# Patient Record
Sex: Male | Born: 1945 | Race: White | Hispanic: No | Marital: Married | Smoking: Former smoker
Health system: Southern US, Community
[De-identification: ages and names within clinical notes are randomized; demographics above are authoritative.]

## PROBLEM LIST (undated history)

## (undated) DIAGNOSIS — H269 Unspecified cataract: Secondary | ICD-10-CM

## (undated) DIAGNOSIS — N400 Enlarged prostate without lower urinary tract symptoms: Secondary | ICD-10-CM

## (undated) DIAGNOSIS — C801 Malignant (primary) neoplasm, unspecified: Secondary | ICD-10-CM

## (undated) DIAGNOSIS — I712 Thoracic aortic aneurysm, without rupture, unspecified: Secondary | ICD-10-CM

## (undated) DIAGNOSIS — R7611 Nonspecific reaction to tuberculin skin test without active tuberculosis: Secondary | ICD-10-CM

## (undated) DIAGNOSIS — L570 Actinic keratosis: Secondary | ICD-10-CM

## (undated) DIAGNOSIS — E039 Hypothyroidism, unspecified: Secondary | ICD-10-CM

## (undated) DIAGNOSIS — M1990 Unspecified osteoarthritis, unspecified site: Secondary | ICD-10-CM

## (undated) DIAGNOSIS — Z85850 Personal history of malignant neoplasm of thyroid: Secondary | ICD-10-CM

## (undated) DIAGNOSIS — E785 Hyperlipidemia, unspecified: Secondary | ICD-10-CM

## (undated) DIAGNOSIS — I10 Essential (primary) hypertension: Secondary | ICD-10-CM

## (undated) DIAGNOSIS — Z923 Personal history of irradiation: Secondary | ICD-10-CM

## (undated) HISTORY — DX: Nonspecific reaction to tuberculin skin test without active tuberculosis: R76.11

## (undated) HISTORY — DX: Malignant (primary) neoplasm, unspecified: C80.1

## (undated) HISTORY — DX: Benign prostatic hyperplasia without lower urinary tract symptoms: N40.0

## (undated) HISTORY — DX: Unspecified osteoarthritis, unspecified site: M19.90

## (undated) HISTORY — DX: Personal history of malignant neoplasm of thyroid: Z85.850

## (undated) HISTORY — PX: COLONOSCOPY: SHX174

## (undated) HISTORY — PX: EYE SURGERY: SHX253

## (undated) HISTORY — DX: Hypothyroidism, unspecified: E03.9

## (undated) HISTORY — DX: Hyperlipidemia, unspecified: E78.5

## (undated) HISTORY — PX: HAND SURGERY: SHX662

## (undated) HISTORY — DX: Actinic keratosis: L57.0

---

## 1988-11-17 DIAGNOSIS — Z923 Personal history of irradiation: Secondary | ICD-10-CM

## 1988-11-17 DIAGNOSIS — Z85850 Personal history of malignant neoplasm of thyroid: Secondary | ICD-10-CM

## 1988-11-17 HISTORY — PX: OTHER SURGICAL HISTORY: SHX169

## 1988-11-17 HISTORY — DX: Personal history of malignant neoplasm of thyroid: Z85.850

## 1988-11-17 HISTORY — DX: Personal history of irradiation: Z92.3

## 1988-11-17 HISTORY — PX: TOTAL THYROIDECTOMY: SHX2547

## 1999-02-05 ENCOUNTER — Ambulatory Visit (HOSPITAL_COMMUNITY): Admission: RE | Admit: 1999-02-05 | Discharge: 1999-02-05 | Payer: Self-pay | Admitting: Family Medicine

## 1999-02-05 ENCOUNTER — Encounter: Payer: Self-pay | Admitting: Family Medicine

## 2003-06-26 ENCOUNTER — Encounter: Payer: Self-pay | Admitting: Family Medicine

## 2003-06-26 ENCOUNTER — Ambulatory Visit (HOSPITAL_COMMUNITY): Admission: RE | Admit: 2003-06-26 | Discharge: 2003-06-26 | Payer: Self-pay | Admitting: Family Medicine

## 2004-05-03 ENCOUNTER — Ambulatory Visit (HOSPITAL_COMMUNITY): Admission: RE | Admit: 2004-05-03 | Discharge: 2004-05-03 | Payer: Self-pay | Admitting: Family Medicine

## 2005-02-24 ENCOUNTER — Ambulatory Visit (HOSPITAL_COMMUNITY): Admission: RE | Admit: 2005-02-24 | Discharge: 2005-02-24 | Payer: Self-pay | Admitting: Gastroenterology

## 2008-12-26 ENCOUNTER — Ambulatory Visit: Payer: Self-pay | Admitting: Internal Medicine

## 2008-12-26 DIAGNOSIS — E039 Hypothyroidism, unspecified: Secondary | ICD-10-CM

## 2008-12-26 DIAGNOSIS — Z85850 Personal history of malignant neoplasm of thyroid: Secondary | ICD-10-CM

## 2008-12-26 DIAGNOSIS — E785 Hyperlipidemia, unspecified: Secondary | ICD-10-CM

## 2008-12-26 HISTORY — DX: Hyperlipidemia, unspecified: E78.5

## 2008-12-26 HISTORY — DX: Hypothyroidism, unspecified: E03.9

## 2008-12-27 ENCOUNTER — Telehealth: Payer: Self-pay | Admitting: Internal Medicine

## 2008-12-27 LAB — CONVERTED CEMR LAB
ALT: 130 units/L — ABNORMAL HIGH (ref 0–53)
AST: 90 units/L — ABNORMAL HIGH (ref 0–37)
Albumin: 4.1 g/dL (ref 3.5–5.2)
Alkaline Phosphatase: 67 units/L (ref 39–117)
BUN: 12 mg/dL (ref 6–23)
Basophils Absolute: 0 10*3/uL (ref 0.0–0.1)
Basophils Relative: 0.3 % (ref 0.0–3.0)
Bilirubin, Direct: 0.1 mg/dL (ref 0.0–0.3)
CO2: 29 meq/L (ref 19–32)
Calcium: 9.2 mg/dL (ref 8.4–10.5)
Chloride: 105 meq/L (ref 96–112)
Cholesterol: 152 mg/dL (ref 0–200)
Creatinine, Ser: 1 mg/dL (ref 0.4–1.5)
Eosinophils Absolute: 0.1 10*3/uL (ref 0.0–0.7)
Eosinophils Relative: 1.8 % (ref 0.0–5.0)
GFR calc Af Amer: 97 mL/min
GFR calc non Af Amer: 80 mL/min
Glucose, Bld: 98 mg/dL (ref 70–99)
HCT: 43.7 % (ref 39.0–52.0)
Hemoglobin: 14.9 g/dL (ref 13.0–17.0)
Lymphocytes Relative: 29.9 % (ref 12.0–46.0)
MCHC: 34.1 g/dL (ref 30.0–36.0)
MCV: 88.3 fL (ref 78.0–100.0)
Monocytes Absolute: 0.5 10*3/uL (ref 0.1–1.0)
Monocytes Relative: 8.3 % (ref 3.0–12.0)
Neutro Abs: 3.5 10*3/uL (ref 1.4–7.7)
Neutrophils Relative %: 59.7 % (ref 43.0–77.0)
PSA: 0.84 ng/mL (ref 0.10–4.00)
Platelets: 178 10*3/uL (ref 150–400)
Potassium: 4.2 meq/L (ref 3.5–5.1)
RBC: 4.95 M/uL (ref 4.22–5.81)
RDW: 12.5 % (ref 11.5–14.6)
Sodium: 141 meq/L (ref 135–145)
TSH: 3.83 microintl units/mL (ref 0.35–5.50)
Total Bilirubin: 0.9 mg/dL (ref 0.3–1.2)
Total Protein: 6.9 g/dL (ref 6.0–8.3)
WBC: 5.9 10*3/uL (ref 4.5–10.5)

## 2009-01-01 ENCOUNTER — Telehealth: Payer: Self-pay | Admitting: Internal Medicine

## 2009-01-26 ENCOUNTER — Ambulatory Visit: Payer: Self-pay | Admitting: Internal Medicine

## 2009-05-03 ENCOUNTER — Ambulatory Visit: Payer: Self-pay | Admitting: Internal Medicine

## 2009-05-03 DIAGNOSIS — J069 Acute upper respiratory infection, unspecified: Secondary | ICD-10-CM | POA: Insufficient documentation

## 2009-10-05 ENCOUNTER — Telehealth: Payer: Self-pay | Admitting: Internal Medicine

## 2010-01-28 ENCOUNTER — Telehealth: Payer: Self-pay | Admitting: Internal Medicine

## 2010-10-04 ENCOUNTER — Ambulatory Visit: Payer: Self-pay | Admitting: Internal Medicine

## 2010-10-04 ENCOUNTER — Encounter: Payer: Self-pay | Admitting: Internal Medicine

## 2010-10-04 LAB — CONVERTED CEMR LAB
ALT: 41 units/L (ref 0–53)
AST: 33 units/L (ref 0–37)
Albumin: 4.4 g/dL (ref 3.5–5.2)
Alkaline Phosphatase: 64 units/L (ref 39–117)
BUN: 19 mg/dL (ref 6–23)
Basophils Absolute: 0 10*3/uL (ref 0.0–0.1)
Basophils Relative: 0.7 % (ref 0.0–3.0)
Bilirubin, Direct: 0.1 mg/dL (ref 0.0–0.3)
CO2: 31 meq/L (ref 19–32)
Calcium: 9.4 mg/dL (ref 8.4–10.5)
Chloride: 102 meq/L (ref 96–112)
Cholesterol: 183 mg/dL (ref 0–200)
Creatinine, Ser: 1.1 mg/dL (ref 0.4–1.5)
Eosinophils Absolute: 0.1 10*3/uL (ref 0.0–0.7)
Eosinophils Relative: 2.6 % (ref 0.0–5.0)
GFR calc non Af Amer: 71.26 mL/min (ref >60)
Glucose, Bld: 93 mg/dL (ref 70–99)
HCT: 45.2 % (ref 39.0–52.0)
HDL: 61.1 mg/dL (ref >39.00)
Hemoglobin: 15.4 g/dL (ref 13.0–17.0)
LDL Cholesterol: 113 mg/dL — ABNORMAL HIGH (ref 0–99)
Lymphocytes Relative: 32.3 % (ref 12.0–46.0)
Lymphs Abs: 1.7 10*3/uL (ref 0.7–4.0)
MCHC: 33.9 g/dL (ref 30.0–36.0)
MCV: 90.1 fL (ref 78.0–100.0)
Monocytes Absolute: 0.5 10*3/uL (ref 0.1–1.0)
Monocytes Relative: 9 % (ref 3.0–12.0)
Neutro Abs: 2.8 10*3/uL (ref 1.4–7.7)
Neutrophils Relative %: 55.4 % (ref 43.0–77.0)
PSA: 0.92 ng/mL (ref 0.10–4.00)
Platelets: 136 10*3/uL — ABNORMAL LOW (ref 150.0–400.0)
Potassium: 4.8 meq/L (ref 3.5–5.1)
RBC: 5.02 M/uL (ref 4.22–5.81)
RDW: 13.7 % (ref 11.5–14.6)
Sodium: 142 meq/L (ref 135–145)
TSH: 2.01 microintl units/mL (ref 0.35–5.50)
Total Bilirubin: 0.8 mg/dL (ref 0.3–1.2)
Total CHOL/HDL Ratio: 3
Total Protein: 6.9 g/dL (ref 6.0–8.3)
Triglycerides: 46 mg/dL (ref 0.0–149.0)
VLDL: 9.2 mg/dL (ref 0.0–40.0)
WBC: 5.1 10*3/uL (ref 4.5–10.5)

## 2010-10-18 ENCOUNTER — Telehealth: Payer: Self-pay

## 2010-12-16 ENCOUNTER — Ambulatory Visit
Admission: RE | Admit: 2010-12-16 | Discharge: 2010-12-16 | Payer: Self-pay | Source: Home / Self Care | Attending: Internal Medicine | Admitting: Internal Medicine

## 2010-12-16 DIAGNOSIS — L570 Actinic keratosis: Secondary | ICD-10-CM | POA: Insufficient documentation

## 2010-12-16 DIAGNOSIS — R300 Dysuria: Secondary | ICD-10-CM | POA: Insufficient documentation

## 2010-12-16 DIAGNOSIS — N410 Acute prostatitis: Secondary | ICD-10-CM | POA: Insufficient documentation

## 2010-12-16 HISTORY — DX: Actinic keratosis: L57.0

## 2010-12-16 LAB — CONVERTED CEMR LAB
Bilirubin Urine: NEGATIVE
Blood in Urine, dipstick: NEGATIVE
Glucose, Urine, Semiquant: NEGATIVE
Ketones, urine, test strip: NEGATIVE
Nitrite: NEGATIVE
Protein, U semiquant: NEGATIVE
Specific Gravity, Urine: 1.015
Urobilinogen, UA: 0.2
WBC Urine, dipstick: NEGATIVE
pH: 6.5

## 2010-12-17 NOTE — Progress Notes (Signed)
Summary: REQUEST FOR LAB RESULTS  Phone Note Call from Patient   Caller: Patient Summary of Call: Pt called to obtain results of recent labwork...Marland KitchenMarland Kitchen Pt can be reached at [phone].  Initial call taken by: Debbra Riding,  October 18, 2010 9:28 AM  Follow-up for Phone Call        attempt to call - no ans no mach . Will try later. KIK Follow-up by: Duard Brady LPN,  October 18, 2010 10:59 AM  Additional Follow-up for Phone Call Additional follow up Details #1::        Pt called back to adv to ck on status of previous phone call.... advised he can be reached at [phone].  Additional Follow-up by: Debbra Riding,  October 18, 2010 3:22 PM    Additional Follow-up for Phone Call Additional follow up Details #2::    attempt to call - ans mach - LMTCB if questions - all labs WNL     KIK Follow-up by: Duard Brady LPN,  October 22, 2010 9:07 AM

## 2010-12-17 NOTE — Progress Notes (Signed)
Summary: REFILLS  Phone Note Refill Request Call back at Home Phone [phone] Message from:  Patient---LIVE CALL  Refills Requested: Medication #1:  LIPITOR 20 MG TABS 1 every other day  Medication #2:  LEVOXYL 137 MCG TABS 1 once daily [facility] FAX---[phone]  Initial call taken by: Warnell Forester,  January 28, 2010 2:03 PM    Prescriptions: LEVOXYL 137 MCG TABS (LEVOTHYROXINE SODIUM) 1 once daily  #90 x 2   Entered by:   Duard Brady LPN   Authorized by:   Gordy Savers  MD   Signed by:   Duard Brady LPN on 95/62/1308   Method used:   Electronically to        [facility]* (mail-order)       [address]       [place], [place]  [address]       Ph: [phone]       Fax: [phone]   RxID:   [id number] LIPITOR 20 MG TABS (ATORVASTATIN CALCIUM) 1 every other day  #90 x 2   Entered by:   Duard Brady LPN   Authorized by:   Gordy Savers  MD   Signed by:   Duard Brady LPN on 38/75/6433   Method used:   Electronically to        [facility]* (mail-order)       [address]       [place], [place]  [address]       Ph: [phone]       Fax: [phone]   RxID:   [id number]

## 2010-12-17 NOTE — Assessment & Plan Note (Signed)
Summary: CPX (PT WILL COME IN FASTING) - MEDICARE/EMP // RS   Vital Signs:  Patient profile:   65 year old male Height:      70 inches Weight:      206 pounds BMI:     29.66 Temp:     98.1 degrees F oral BP sitting:   128 / 76  (right arm) Cuff size:   regular  Vitals Entered By: Duard Brady LPN (October 04, 2010 8:36 AM) CC: cpx - doing well    **declines flu vaccine Is Patient Diabetic? No   CC:  cpx - doing well    **declines flu vaccine.  History of Present Illness: 65 year old patient who is seen today for a wellness exam.  He enjoys excellent health visit history of hypothyroidism and mild dyslipidemia.  No concerns or complaints.  Today  Here for Medicare AWV:  1.   Risk factors based on Past M, S, F history:  cardiovascular risk factors include a history of dyslipidemia; mother had an MI at age 66 2.   Physical Activities:  goes to his health club at least 3 times per week 3.   Depression/mood: no history of depression or mood disorder 4.   Hearing: no hearing deficits 5.   ADL's: independent in all aspects of daily living 6.   Fall Risk: low 7.   Home Safety: no problems  identified 8.   Height, weight, &visual acuity:  height and weight stable.  No difficulty with visual acuity 9.   Counseling: heart healthy diet regular exercise.  All encouraged 10.   Labs ordered based on risk factors: laboratory profile including lipid panel will be reviewed.  A TSH will be reviewed 11.           Referral Coordination- not appropriate at this time 12.           Care Plan- continue heart healthy diet and regular.  Exercise regimen 13.            Cognitive Assessment- alert and oriented, with normal affect.  No cognitive dysfunction ; handles  all executive functions without difficulty   Allergies: 1)  ! 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 Father died age  mid 81s- MVA, 68, TB,   Mother- MI age 74, tobacco  1 brother-obesity  Social History: Reviewed history from 12/26/2008 and no changes required. 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    Physical Exam  General:  Well-developed,well-nourished,in no acute distress; alert,appropriate and cooperative throughout examination Head:  Normocephalic and atraumatic without obvious abnormalities. No apparent alopecia or balding. Eyes:  No corneal or conjunctival inflammation noted. EOMI. Perrla. Funduscopic exam benign, without hemorrhages, exudates or papilledema. Vision grossly normal. Ears:  External ear exam shows no significant lesions or deformities.  Otoscopic examination reveals clear canals, tympanic membranes are intact bilaterally without bulging,  retraction, inflammation or discharge. Hearing is grossly normal bilaterally. Nose:  External nasal examination shows no deformity or inflammation. Nasal mucosa are pink and moist without lesions or exudates. Mouth:  Oral mucosa and oropharynx without lesions or exudates.  Teeth in good repair. Neck:  No deformities, masses, or tenderness noted. Chest Wall:  No deformities,  masses, tenderness or gynecomastia noted. Breasts:  No masses or gynecomastia noted Lungs:  Normal respiratory effort, chest expands symmetrically. Lungs are clear to auscultation, no crackles or wheezes. Heart:  Normal rate and regular rhythm. S1 and S2 normal without gallop, murmur, click, rub or other extra sounds. Abdomen:  Bowel sounds positive,abdomen soft and non-tender without masses, organomegaly or hernias noted. Rectal:  No external abnormalities noted. Normal sphincter tone. No rectal masses or tenderness. Genitalia:  Testes bilaterally descended without nodularity, tenderness or masses. No scrotal masses or lesions. No penis lesions or urethral discharge. Prostate:  Prostate gland firm and smooth, no enlargement, nodularity, tenderness, mass, asymmetry or induration. Msk:  No deformity or scoliosis noted of thoracic or lumbar spine.   Pulses:  R and L carotid,radial,femoral,dorsalis pedis and posterior tibial pulses are full and equal bilaterally Extremities:  No clubbing, cyanosis, edema, or deformity noted with normal full range of motion of all joints.   Neurologic:  No cranial nerve deficits noted. Station and gait are normal. Plantar reflexes are down-going bilaterally. DTRs are symmetrical throughout. Sensory, motor and coordinative functions appear intact. Skin:  Intact without suspicious lesions or rashes Cervical Nodes:  No lymphadenopathy noted Axillary Nodes:  No palpable lymphadenopathy Inguinal Nodes:  No significant adenopathy Psych:  Cognition and judgment appear intact. Alert and cooperative with normal attention span and concentration. No apparent delusions, illusions, hallucinations   Impression & Recommendations:  Problem # 1:  PREVENTIVE HEALTH CARE (ICD-V70.0)  Orders: EKG w/ Interpretation (93000) Medicare -1st Annual Wellness Visit [phone]) TLB-BMP (Basic Metabolic Panel-BMET) (80048-METABOL) TLB-CBC Platelet - w/Differential  (85025-CBCD) TLB-Hepatic/Liver Function Pnl (80076-HEPATIC) TLB-PSA (Prostate Specific Antigen) (84153-PSA) Specimen Handling (96295)  Complete Medication List: 1)  Lipitor 20 Mg Tabs (Atorvastatin calcium) .Marland Kitchen.. 1 every  day 2)  Levoxyl 137 Mcg Tabs (Levothyroxine sodium) .Marland Kitchen.. 1 once daily 3)  Adult Aspirin Ec Low Strength 81 Mg Tbec (Aspirin) .Marland Kitchen.. 1 once daily 4)  Cialis 20 Mg Tabs (Tadalafil) .... Once daily as directed 5)  Glucosamine 500 Mg Caps (Glucosamine sulfate) .... 3 qd  Other Orders: Venipuncture (28413) TLB-Lipid Panel (80061-LIPID) TLB-TSH (Thyroid Stimulating Hormone) (24401-UUV)  Patient Instructions: 1)  Please schedule a follow-up appointment in 1 year. 2)  Limit your Sodium (Salt) to less than 2 grams a day(slightly less than 1/2 a teaspoon) to prevent fluid retention, swelling, or worsening of symptoms. 3)  It is important that you exercise regularly at least 20 minutes 5 times a week. If you develop chest pain, have severe difficulty breathing, or feel very tired , stop exercising immediately and seek medical attention. Prescriptions: LIPITOR 20 MG TABS (ATORVASTATIN CALCIUM) 1 every  day  #90 x 6   Entered and Authorized by:   Gordy Savers  MD   Signed by:   Gordy Savers  MD on 10/04/2010   Method used:   Electronically to        [facility]* (mail-order)       [address]       [place], [place]  [address]       Ph: [phone]       Fax: [phone]  RxID:   [id number] LEVOXYL 137 MCG TABS (LEVOTHYROXINE SODIUM) 1 once daily  #90 x 6   Entered and Authorized by:   Gordy Savers  MD   Signed by:   Gordy Savers  MD on 10/04/2010   Method used:   Electronically to        [facility]* (mail-order)       [address]       [place], [place]  [address]       Ph: [phone]       Fax: [phone]   RxID:   [id number] LIPITOR 20 MG TABS (ATORVASTATIN CALCIUM) 1 every other day  #90  x 6   Entered and Authorized by:   Gordy Savers  MD   Signed by:   Gordy Savers  MD on 10/04/2010   Method used:   Electronically to        [facility]* (mail-order)       [address]       [place], [place]  [address]       Ph: [phone]       Fax: [phone]   RxID:   [id number]    Orders Added: 1)  EKG w/ Interpretation [93000] 2)  Medicare -1st Annual Wellness Visit [G0438] 3)  Est. Patient Level III [32355] 4)  Venipuncture [36415] 5)  TLB-Lipid Panel [80061-LIPID] 6)  TLB-BMP (Basic Metabolic Panel-BMET) [80048-METABOL] 7)  TLB-CBC Platelet - w/Differential [85025-CBCD] 8)  TLB-Hepatic/Liver Function Pnl [80076-HEPATIC] 9)  TLB-TSH (Thyroid Stimulating Hormone) [84443-TSH] 10)  TLB-PSA (Prostate Specific Antigen) [73220-URK] 11)  Specimen Handling [99000]

## 2010-12-24 ENCOUNTER — Encounter: Payer: Self-pay | Admitting: Internal Medicine

## 2010-12-24 ENCOUNTER — Telehealth: Payer: Self-pay | Admitting: *Deleted

## 2010-12-24 ENCOUNTER — Ambulatory Visit (INDEPENDENT_AMBULATORY_CARE_PROVIDER_SITE_OTHER): Payer: MEDICARE | Admitting: Internal Medicine

## 2010-12-24 VITALS — BP 120/70 | Temp 98.2°F | Ht 70.0 in | Wt 208.0 lb

## 2010-12-24 DIAGNOSIS — R110 Nausea: Secondary | ICD-10-CM

## 2010-12-24 NOTE — Telephone Encounter (Signed)
Pt is complaining of low grade fever, nausea, vomiting and had to stop his antibiotics due GI symptoms before he finished his course.  Advised appt today with Dr. Kirtland Bouchard and was given a time.

## 2010-12-24 NOTE — Progress Notes (Signed)
  Subjective:    Patient ID: Carlos David, male    DOB: 07/15/1946, 65 y.o.   MRN: [id number]  HPI  65 year old patient who was evaluated last week with symptoms of urinary frequency and dysuria.  He was placed on Cipro, which he discontinued 3 days ago due to nausea.  His nausea has persisted but the urinary frequency and dysuria have resolved.  Denies any abdominal pain, vomiting, or change in his bowel habits.  Denies any fever.    Review of Systems  Constitutional: Negative for fever, chills, appetite change and fatigue.  HENT: Negative for hearing loss, ear pain, congestion, sore throat, trouble swallowing, neck stiffness, dental problem, voice change and tinnitus.   Eyes: Negative for pain, discharge and visual disturbance.  Respiratory: Negative for cough, chest tightness, wheezing and stridor.   Cardiovascular: Negative for chest pain, palpitations and leg swelling.  Gastrointestinal: Negative for nausea, vomiting, abdominal pain, diarrhea, constipation, blood in stool and abdominal distention.  Genitourinary: Negative for urgency, hematuria, flank pain, discharge, difficulty urinating and genital sores.  Musculoskeletal: Negative for myalgias, back pain, joint swelling, arthralgias and gait problem.  Skin: Negative for rash.  Neurological: Negative for dizziness, syncope, speech difficulty, weakness, numbness and headaches.  Hematological: Negative for adenopathy. Does not bruise/bleed easily.  Psychiatric/Behavioral: Negative for behavioral problems and dysphoric mood. The patient is not nervous/anxious.        Objective:   Physical Exam  Constitutional: He is oriented to person, place, and time. He appears well-developed.  HENT:  Head: Normocephalic.  Right Ear: External ear normal.  Left Ear: External ear normal.  Mouth/Throat: Oropharynx is clear and moist.  Eyes: Conjunctivae and EOM are normal.  Neck: Normal range of motion.  Cardiovascular: Normal rate and normal  heart sounds.   Pulmonary/Chest: Effort normal and breath sounds normal.  Abdominal: Bowel sounds are normal. He exhibits no distension and no mass. There is no tenderness. There is no rebound and no guarding.  Musculoskeletal: Normal range of motion. He exhibits no edema and no tenderness.  Neurological: He is alert and oriented to person, place, and time.  Psychiatric: He has a normal mood and affect. His behavior is normal.          Assessment & Plan:

## 2010-12-24 NOTE — Patient Instructions (Signed)
Drink clear liquids only for the next 24 hours,  then slowly add other liquids and food as you  tolerate them  Call or return to clinic prn if these symptoms worsen or fail to improve as anticipated.  

## 2010-12-25 NOTE — Assessment & Plan Note (Signed)
Summary: ?uti/lower abdominal pain/painful urination/cjr   Vital Signs:  Patient profile:   65 year old male Weight:      202 pounds Temp:     98.2 degrees F oral BP sitting:   120 / 82  (right arm) Cuff size:   regular  Vitals Entered By: Duard Brady LPN (December 16, 2010 3:42 PM) CC: c/o painful urination Is Patient Diabetic? No   CC:  c/o painful urination.  History of Present Illness: 46 -year-old patient who presents with a 5-day history of burning, dysuria, urgency, and slow urinary stream.  He was seen for a physical two months ago and has a normal prostate examination and a low PSA.  his has some mild constipation issues related to his voiding difficulties.  No nocturia.  Denies any hematuria.  A UA was reviewed today and was normal. he also complained of a chronic lesion involving the dorsum of his right hand     Allergies: 1)  ! Penicillin G Potassium (Penicillin G Potassium)  Past History:  Past Surgical History: Reviewed history from 10/04/2010 and no changes required. status post thyroidectomy in 1990 followed by left neck dissection Tonsillectomy  childhood colonoscopy 2008   Physical Exam  General:  Well-developed,well-nourished,in no acute distress; alert,appropriate and cooperative throughout examination Abdomen:  Bowel sounds positive,abdomen soft and non-tender without masses, organomegaly or hernias noted. 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 1 every  day 2)  Levoxyl 137 Mcg Tabs (Levothyroxine sodium) .Marland Kitchen.. 1 once daily 3)  Adult Aspirin Ec Low Strength 81 Mg Tbec (Aspirin) .Marland Kitchen.. 1 once daily 4)  Cialis 20 Mg Tabs (Tadalafil) .... Once daily as directed 5)  Glucosamine 500 Mg Caps (Glucosamine sulfate) .... 3 qd 6)  Ciprofloxacin Hcl 500 Mg Tabs (Ciprofloxacin hcl) .... One twice daily 7)  Tamsulosin Hcl 0.4 Mg Caps (Tamsulosin hcl) .... One daily  Patient Instructions: 1)  Drink as much fluid as you can tolerate for the next few days. 2)  Take your antibiotic as prescribed until ALL of it is gone, but stop if you develop a rash or swelling and contact our office as soon as possible. Prescriptions: TAMSULOSIN HCL 0.4 MG CAPS (TAMSULOSIN HCL) one daily  #30 x 0   Entered and Authorized by:   Gordy Savers  MD   Signed by:   Gordy Savers  MD on 12/16/2010   Method used:   Print then Give to Patient   RxID:   [id number] CIPROFLOXACIN HCL 500 MG TABS (CIPROFLOXACIN HCL) one twice daily  #20 x 0   Entered and Authorized by:   Gordy Savers  MD   Signed by:   Gordy Savers  MD on 12/16/2010   Method used:   Print then Give to Patient   RxID:   [id number]    Orders Added: 1)  UA Dipstick w/o Micro (manual) [81002] 2)  Est. Patient Level III [29528] 3)  Cryotherapy/Destruction benign or premalignant  lesion (1st lesion)  [17000]    Laboratory Results   Urine Tests  Date/Time Received: December 16, 2010 3:54 PM  Date/Time Reported: December 16, 2010 3:54 PM   Routine Urinalysis   Color: yellow Appearance: Clear Glucose: negative   (Normal Range: Negative) Bilirubin: negative   (Normal Range: Negative) Ketone: negative   (Normal Range: Negative) Spec. Gravity: 1.015   (Normal Range: 1.003-1.035) Blood: negative   (Normal Range: Negative) pH: 6.5   (Normal Range: 5.0-8.0) Protein: negative   (Normal Range: Negative) Urobilinogen: 0.2    (Normal Range: 0-1) Nitrite: negative   (Normal Range: Negative) Leukocyte Esterace: negative   (Normal Range: Negative)

## 2011-02-12 ENCOUNTER — Other Ambulatory Visit: Payer: Self-pay | Admitting: Internal Medicine

## 2011-04-04 NOTE — Op Note (Signed)
Carlos David, CIANCI NO.:  [id number]   MEDICAL RECORD NO.:  [id number]          PATIENT TYPE:  AMB   LOCATION:  ENDO                         FACILITY[facility]  [facility]   PHYSICIAN:  John C. Madilyn Fireman, M.D.    DATE OF BIRTH:  Nov 11, 1946   DATE OF PROCEDURE:  02/24/2005  DATE OF DISCHARGE:                                 OPERATIVE REPORT   PROCEDURE:  Colonoscopy.   INDICATIONS FOR PROCEDURE:  Average risk colon cancer screening.   PROCEDURE:  The patient was placed in the left lateral decubitus position  and placed on the pulse monitor with continuous low-flow oxygen delivered by  nasal cannula.  He was sedated with 75 mcg IV fentanyl and 6 mg IV Versed.  The Olympus video colonoscope was inserted into the rectum and advanced to  cecum, confirmed by transillumination of McBurney's point and visualization  of ileocecal valve and appendiceal orifice.  The prep was excellent.  The  cecum, ascending, transverse, descending, and sigmoid colon all appeared  normal with no masses, polyps, diverticula, or other mucosal abnormalities.  The rectum likewise appeared normal, and retroflexed view of the anus  revealed no obvious internal hemorrhoids.  The scope was then withdrawn and  the patient returned to the recovery room in stable condition.  He tolerated  the procedure well, and there were no immediate complications.   IMPRESSION:  Normal colonoscopy.   PLAN:  Next colon screening colonoscopy within 10 years and consider sigmoid  sigmoidoscopy or Hemoccults in 5 years.      [facility]  D:  02/24/2005  T:  02/24/2005  Job:  [id number]   cc:   Holley Bouche, M.D.  [address]  [place], [place] [address]  Fax: [phone]

## 2011-06-10 ENCOUNTER — Telehealth: Payer: Self-pay | Admitting: Internal Medicine

## 2011-06-10 MED ORDER — SILDENAFIL CITRATE 100 MG PO TABS: 100.0000 mg | ORAL_TABLET | ORAL | Status: DC | PRN

## 2011-06-10 NOTE — Telephone Encounter (Signed)
Pt aware rx ready for pick up. 

## 2011-06-10 NOTE — Telephone Encounter (Signed)
ok 

## 2011-06-10 NOTE — Telephone Encounter (Signed)
Pt called 7/24. Requesting a Rx for Viagra, 100mg , 30 tabs. Requesting a written Rx that he can pick up. Please call when ready.

## 2011-06-10 NOTE — Telephone Encounter (Signed)
Please advise 

## 2011-06-30 ENCOUNTER — Other Ambulatory Visit: Payer: Self-pay | Admitting: Internal Medicine

## 2011-06-30 MED ORDER — ATORVASTATIN CALCIUM 20 MG PO TABS: 20.0000 mg | ORAL_TABLET | Freq: Every day | ORAL | Status: DC

## 2011-06-30 NOTE — Telephone Encounter (Signed)
Pt needs refill on Lipitor and wanted to know if he could get put on the generic Lipitor 10mg  it would be $50 dollars less a month for pt.

## 2011-06-30 NOTE — Telephone Encounter (Signed)
efiled  done

## 2011-07-04 ENCOUNTER — Telehealth: Payer: Self-pay | Admitting: Internal Medicine

## 2011-07-04 NOTE — Telephone Encounter (Signed)
Pt called about the Rx in Lipitor he just refilled. He states that before he was taking 20mg  QOD. He says the Sig on the new refill says 20mg  QD. Please call and advise. Thanks.

## 2011-07-04 NOTE — Telephone Encounter (Signed)
Attempt to call - VM - LMTCB if questions - if taking qod -cuntinue - qd will just give more med for same copay. KIK

## 2011-10-17 ENCOUNTER — Other Ambulatory Visit: Payer: Self-pay

## 2011-10-17 ENCOUNTER — Telehealth: Payer: Self-pay | Admitting: Internal Medicine

## 2011-10-17 MED ORDER — LEVOTHYROXINE SODIUM 137 MCG PO TABS: 137.0000 ug | ORAL_TABLET | Freq: Every day | ORAL | Status: DC

## 2011-10-17 NOTE — Telephone Encounter (Signed)
Pt requesting a 90 day script on levothyroxine (SYNTHROID, LEVOTHROID) 137 MCG tablet  Pt would like to pick up script he no longer wants to use mail order pharmacy and would like to go to a local pharmacy. Please contact

## 2011-10-17 NOTE — Telephone Encounter (Signed)
Attempt to call - VM - LMTCB if questions - rx sent to [facility] [address] KIK

## 2012-02-10 ENCOUNTER — Other Ambulatory Visit (INDEPENDENT_AMBULATORY_CARE_PROVIDER_SITE_OTHER): Payer: Medicare Other

## 2012-02-10 DIAGNOSIS — Z0000 Encounter for general adult medical examination without abnormal findings: Secondary | ICD-10-CM

## 2012-02-10 DIAGNOSIS — N419 Inflammatory disease of prostate, unspecified: Secondary | ICD-10-CM

## 2012-02-10 DIAGNOSIS — E785 Hyperlipidemia, unspecified: Secondary | ICD-10-CM

## 2012-02-10 LAB — CBC WITH DIFFERENTIAL/PLATELET
Basophils Absolute: 0 10*3/uL (ref 0.0–0.1)
Eosinophils Absolute: 0.1 10*3/uL (ref 0.0–0.7)
HCT: 46.9 % (ref 39.0–52.0)
Lymphs Abs: 1.6 10*3/uL (ref 0.7–4.0)
MCHC: 33.2 g/dL (ref 30.0–36.0)
MCV: 90.1 fl (ref 78.0–100.0)
Monocytes Absolute: 0.4 10*3/uL (ref 0.1–1.0)
Neutro Abs: 3.2 10*3/uL (ref 1.4–7.7)
Platelets: 140 10*3/uL — ABNORMAL LOW (ref 150.0–400.0)
RDW: 13.6 % (ref 11.5–14.6)

## 2012-02-10 LAB — BASIC METABOLIC PANEL
BUN: 22 mg/dL (ref 6–23)
Creatinine, Ser: 1.1 mg/dL (ref 0.4–1.5)
GFR: 68.8 mL/min (ref >60.00)
Potassium: 4.3 mEq/L (ref 3.5–5.1)

## 2012-02-10 LAB — POCT URINALYSIS DIPSTICK
Bilirubin, UA: NEGATIVE
Ketones, UA: NEGATIVE
Leukocytes, UA: NEGATIVE
Protein, UA: NEGATIVE
Spec Grav, UA: 1.02
pH, UA: 6

## 2012-02-10 LAB — TSH: TSH: 1.31 u[IU]/mL (ref 0.35–5.50)

## 2012-02-10 LAB — PSA: PSA: 0.99 ng/mL (ref 0.10–4.00)

## 2012-02-10 LAB — LIPID PANEL
Cholesterol: 171 mg/dL (ref 0–200)
HDL: 64.9 mg/dL (ref >39.00)
VLDL: 11.6 mg/dL (ref 0.0–40.0)

## 2012-02-10 LAB — HEPATIC FUNCTION PANEL
Albumin: 4.4 g/dL (ref 3.5–5.2)
Total Bilirubin: 0.8 mg/dL (ref 0.3–1.2)

## 2012-02-17 ENCOUNTER — Encounter: Payer: Self-pay | Admitting: Internal Medicine

## 2012-02-17 ENCOUNTER — Ambulatory Visit (INDEPENDENT_AMBULATORY_CARE_PROVIDER_SITE_OTHER): Payer: Medicare Other | Admitting: Internal Medicine

## 2012-02-17 VITALS — BP 120/80 | HR 84 | Temp 98.3°F | Resp 18 | Ht 69.0 in | Wt 203.0 lb

## 2012-02-17 DIAGNOSIS — Z0000 Encounter for general adult medical examination without abnormal findings: Secondary | ICD-10-CM

## 2012-02-17 DIAGNOSIS — Z23 Encounter for immunization: Secondary | ICD-10-CM

## 2012-02-17 DIAGNOSIS — E785 Hyperlipidemia, unspecified: Secondary | ICD-10-CM

## 2012-02-17 DIAGNOSIS — E039 Hypothyroidism, unspecified: Secondary | ICD-10-CM

## 2012-02-17 MED ORDER — ATORVASTATIN CALCIUM 20 MG PO TABS: 20.0000 mg | ORAL_TABLET | Freq: Every day | ORAL | Status: DC

## 2012-02-17 MED ORDER — LEVOTHYROXINE SODIUM 137 MCG PO TABS: 137.0000 ug | ORAL_TABLET | Freq: Every day | ORAL | Status: DC

## 2012-02-17 MED ORDER — SILDENAFIL CITRATE 100 MG PO TABS: 100.0000 mg | ORAL_TABLET | ORAL | Status: DC | PRN

## 2012-02-17 NOTE — Progress Notes (Signed)
Subjective:    Patient ID: Carlos David, male    DOB: 11/28/1945, 66 y.o.   MRN: [id number]  HPI  History of Present Illness:   66 year-old patient who is seen today for a wellness exam. He enjoys excellent health visit history of hypothyroidism and mild dyslipidemia. No concerns or complaints. Today   Here for Medicare AWV:  1. Risk factors based on Past M, S, F history: cardiovascular risk factors include a history of dyslipidemia; mother had an MI at age 78  2. Physical Activities: goes to his health club at least 3 times per week  3. Depression/mood: no history of depression or mood disorder  4. Hearing: no hearing deficits  5. ADL's: independent in all aspects of daily living  6. Fall Risk: low  7. Home Safety: no problems identified  8. Height, weight, &visual acuity: height and weight stable. No difficulty with visual acuity  9. Counseling: heart healthy diet regular exercise. All encouraged  10. Labs ordered based on risk factors: laboratory profile including lipid panel will be reviewed. A TSH will be reviewed  11. Referral Coordination- not appropriate at this time  12. Care Plan- continue heart healthy diet and regular. Exercise regimen  13. Cognitive Assessment- alert and oriented, with normal affect. No cognitive dysfunction ; handles all executive functions without difficulty   Allergies:  1) ! 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  Father died age mid 106s- MVA, 63, TB,  Mother- MI age 32, tobacco  1 brother-obesity   Social History:  Reviewed history from 12/26/2008 and no changes required.  Married  Airline pilot  former pipe smoker    Retired    Review of Systems  Constitutional: Negative for fever, chills, activity change, appetite change and fatigue.  HENT: Negative for hearing loss, ear pain, congestion, rhinorrhea, sneezing, mouth sores, trouble swallowing, neck pain, neck stiffness, dental problem, voice change, sinus pressure and tinnitus.   Eyes: Negative for photophobia, pain, redness and visual disturbance.  Respiratory: Negative for apnea, cough, choking, chest tightness, shortness of breath and wheezing.   Cardiovascular: Negative for chest pain, palpitations and leg swelling.  Gastrointestinal: Negative for nausea, vomiting, abdominal pain, diarrhea, constipation, blood in stool, abdominal distention, anal bleeding and rectal pain.  Genitourinary: Negative for dysuria, urgency, frequency, hematuria, flank pain, decreased urine volume, discharge, penile swelling, scrotal swelling, difficulty urinating, genital sores and testicular pain.  Musculoskeletal: Positive for joint swelling (mild swelling in the small joints of the hands). Negative for myalgias, back pain, arthralgias and gait problem.  Skin: Negative for color change, rash and wound.  Neurological: Negative for dizziness, tremors, seizures, syncope, facial asymmetry, speech difficulty, weakness, light-headedness, numbness and headaches.  Hematological: Negative for adenopathy. Does not bruise/bleed easily.  Psychiatric/Behavioral: Negative for suicidal ideas, hallucinations, behavioral problems, confusion, sleep disturbance, self-injury, dysphoric mood, decreased concentration and agitation. The patient is not nervous/anxious.        Objective:   Physical Exam  Constitutional: He appears well-developed and well-nourished.  HENT:  Head: Normocephalic and atraumatic.  Right Ear: External ear normal.  Left Ear: External ear normal.  Nose: Nose normal.  Mouth/Throat: Oropharynx is clear and moist.  Eyes: Conjunctivae and  EOM are normal. Pupils are  equal, round, and reactive to light. No scleral icterus.  Neck: Normal range of motion. Neck supple. No JVD present. No thyromegaly present.       Surgical scars noted in the anterior and left lateral neck regions  Cardiovascular: Regular rhythm, normal heart sounds and intact distal pulses.  Exam reveals no gallop and no friction rub.   No murmur heard. Pulmonary/Chest: Effort normal and breath sounds normal. He exhibits no tenderness.  Abdominal: Soft. Bowel sounds are normal. He exhibits no distension and no mass. There is no tenderness.  Genitourinary: Prostate normal and penis normal.  Musculoskeletal: Normal range of motion. He exhibits no edema and no tenderness.       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  Neurological: He is alert. He has normal reflexes. No cranial nerve deficit. Coordination normal.  Skin: Skin is warm and dry. No rash noted.  Psychiatric: He has a normal mood and affect. His behavior is normal.          Assessment & Plan:   Preventive health examination  Hypothyroidism  Status post thyroidectomy. TSH normal on thyroid replacement  Dyslipidemia well controlled we'll continue atorvastatin   Recheck one year

## 2012-02-17 NOTE — Patient Instructions (Signed)
Limit your sodium (Salt) intake    It is important that you exercise regularly, at least 20 minutes 3 to 4 times per week.  If you develop chest pain or shortness of breath seek  medical attention.  Return in one year for follow-up   

## 2012-04-30 ENCOUNTER — Ambulatory Visit (INDEPENDENT_AMBULATORY_CARE_PROVIDER_SITE_OTHER): Payer: Medicare Other | Admitting: Internal Medicine

## 2012-04-30 ENCOUNTER — Encounter: Payer: Self-pay | Admitting: Internal Medicine

## 2012-04-30 VITALS — BP 130/80 | Temp 98.0°F | Wt 204.0 lb

## 2012-04-30 DIAGNOSIS — M25569 Pain in unspecified knee: Secondary | ICD-10-CM

## 2012-04-30 DIAGNOSIS — M533 Sacrococcygeal disorders, not elsewhere classified: Secondary | ICD-10-CM

## 2012-04-30 DIAGNOSIS — E785 Hyperlipidemia, unspecified: Secondary | ICD-10-CM

## 2012-04-30 DIAGNOSIS — E039 Hypothyroidism, unspecified: Secondary | ICD-10-CM

## 2012-04-30 NOTE — Progress Notes (Signed)
  Subjective:    Patient ID: Carlos David, male    DOB: 1946-02-01, 66 y.o.   MRN: [id number]  HPI 66 year old patient  Presents with a chief complaint of right knee pain as well as a painful tailbone. He spent all day fishing sitting on a hard surface and has had subsequent pain over the coccyx area. This is relieved by laying flat and anti-inflammatories. He is also has some pain involving the inferior aspect of the right medial knee also relieved with cool compresses and ibuprofen. He does have a orthopedic appointment scheduled for next week. Medical problems include hypothyroidism and dyslipidemia.    Review of Systems  Constitutional: Negative for fever, chills, appetite change and fatigue.  HENT: Negative for hearing loss, ear pain, congestion, sore throat, trouble swallowing, neck stiffness, dental problem, voice change and tinnitus.   Eyes: Negative for pain, discharge and visual disturbance.  Respiratory: Negative for cough, chest tightness, wheezing and stridor.   Cardiovascular: Negative for chest pain, palpitations and leg swelling.  Gastrointestinal: Negative for nausea, vomiting, abdominal pain, diarrhea, constipation, blood in stool and abdominal distention.  Genitourinary: Negative for urgency, hematuria, flank pain, discharge, difficulty urinating and genital sores.  Musculoskeletal: Positive for joint swelling. Negative for myalgias, back pain, arthralgias and gait problem.  Skin: Negative for rash.  Neurological: Negative for dizziness, syncope, speech difficulty, weakness, numbness and headaches.  Hematological: Negative for adenopathy. Does not bruise/bleed easily.  Psychiatric/Behavioral: Negative for behavioral problems and dysphoric mood. The patient is not nervous/anxious.        Objective:   Physical Exam  Constitutional: He is oriented to person, place, and time. He appears well-developed and well-nourished. No distress.  HENT:  Head: Normocephalic.  Eyes:  Conjunctivae and EOM are normal.  Cardiovascular: Normal rate and normal heart sounds.   Pulmonary/Chest: Breath sounds normal.  Abdominal: Bowel sounds are normal.  Musculoskeletal: Normal range of motion. He exhibits tenderness. He exhibits no edema.       Tenderness noted over the coccyx to palpation There did appear to be some mild soft tissue swelling involving the inferior aspect of the right medial knee. This area was also mildly tender  Neurological: He is alert and oriented to person, place, and time.  Psychiatric: He has a normal mood and affect. His behavior is normal.          Assessment & Plan:  Coccidynia Right knee pain. This appears to be more of a tendinitis involving the insertion site of the right medial collateral ligament.  We'll treat with Aleve 2 twice daily. Orthopedic followup next week if unimproved

## 2012-04-30 NOTE — Patient Instructions (Signed)
  Take 2 Aleve twice daily   Call or return to clinic prn if these symptoms worsen or fail to improve as anticipated.

## 2012-05-24 ENCOUNTER — Ambulatory Visit (INDEPENDENT_AMBULATORY_CARE_PROVIDER_SITE_OTHER): Payer: Self-pay | Admitting: Internal Medicine

## 2012-05-24 DIAGNOSIS — Z23 Encounter for immunization: Secondary | ICD-10-CM

## 2012-05-24 DIAGNOSIS — Z299 Encounter for prophylactic measures, unspecified: Secondary | ICD-10-CM

## 2012-06-24 ENCOUNTER — Ambulatory Visit (INDEPENDENT_AMBULATORY_CARE_PROVIDER_SITE_OTHER): Payer: Medicare Other

## 2012-06-24 DIAGNOSIS — Z0000 Encounter for general adult medical examination without abnormal findings: Secondary | ICD-10-CM

## 2012-06-24 DIAGNOSIS — Z23 Encounter for immunization: Secondary | ICD-10-CM

## 2012-07-15 ENCOUNTER — Ambulatory Visit: Payer: Medicare Other | Admitting: Internal Medicine

## 2012-07-16 ENCOUNTER — Ambulatory Visit (INDEPENDENT_AMBULATORY_CARE_PROVIDER_SITE_OTHER): Payer: Medicare Other | Admitting: Internal Medicine

## 2012-07-16 ENCOUNTER — Encounter: Payer: Self-pay | Admitting: Internal Medicine

## 2012-07-16 VITALS — BP 120/86 | Temp 97.6°F | Wt 202.0 lb

## 2012-07-16 DIAGNOSIS — M1990 Unspecified osteoarthritis, unspecified site: Secondary | ICD-10-CM

## 2012-07-16 MED ORDER — TRAMADOL HCL 50 MG PO TABS
50.0000 mg | ORAL_TABLET | Freq: Three times a day (TID) | ORAL | Status: AC | PRN
Start: 2012-07-16 — End: 2012-07-26

## 2012-07-16 NOTE — Patient Instructions (Signed)
Call or return to clinic prn if these symptoms worsen or fail to improve as anticipated.

## 2012-07-16 NOTE — Progress Notes (Signed)
  Subjective:    Patient ID: Carlos David, male    DOB: 1946/11/06, 66 y.o.   MRN: [id number]  HPI  66 year old patient who presents today for followup. He is followed by orthopedics for osteoarthritis especially involving the small joints of the hands. He does work out regularly and complaints today include right lower posterior chest wall pain the pain is aggravated by twisting to the left. He does workout at the gym and feels he may have injured this area while exercising. 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  HENT: Negative for hearing loss, ear pain, congestion, sore throat, trouble swallowing, neck stiffness, dental problem, voice change and tinnitus.   Eyes: Negative for pain, discharge and visual disturbance.  Respiratory: Negative for cough, chest tightness, wheezing and stridor.   Cardiovascular: Positive for chest pain. Negative for palpitations and leg swelling.  Gastrointestinal: Negative for nausea, vomiting, abdominal pain, diarrhea, constipation, blood in stool and abdominal distention.  Genitourinary: Negative for urgency, hematuria, flank pain, discharge, difficulty urinating and genital sores.  Musculoskeletal: Positive for back pain. Negative for myalgias, joint swelling, arthralgias and gait problem.  Skin: Negative for rash.  Neurological: Negative for dizziness, syncope, speech difficulty, weakness, numbness and headaches.  Hematological: Negative for adenopathy. Does not bruise/bleed easily.  Psychiatric/Behavioral: Negative for behavioral problems and dysphoric mood. The patient is not nervous/anxious.        Objective:   Physical Exam  Constitutional: He is oriented to person, place, and time. He appears well-developed.  HENT:  Head: Normocephalic.  Right Ear: External ear normal.  Left Ear: External ear normal.  Eyes: Conjunctivae and EOM are  normal.  Neck: Normal range of motion.  Cardiovascular: Normal rate and normal heart sounds.   Pulmonary/Chest: Effort normal and breath sounds normal. No respiratory distress. He has no wheezes.  Abdominal: Bowel sounds are normal.  Musculoskeletal: Normal range of motion. He exhibits no edema and no tenderness.       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  Psychiatric: He has a normal mood and affect. His behavior is normal.          Assessment & Plan:   Chest wall pain probable muscular strain Coxodynia  We'll treat with anti-inflammatories. We'll also call in a prescription for tramadol.

## 2012-07-20 ENCOUNTER — Ambulatory Visit: Payer: Medicare Other | Admitting: Internal Medicine

## 2012-10-12 ENCOUNTER — Telehealth: Payer: Self-pay | Admitting: Internal Medicine

## 2012-10-12 NOTE — Telephone Encounter (Signed)
Pt has been in to [facility] to Dr Juliene Pina re: tailbone pain. 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 Dr Juliene Pina was supposed to be sending the report to Dr Amador Cunas for review. Pls call pt.

## 2012-10-20 ENCOUNTER — Other Ambulatory Visit: Payer: Self-pay | Admitting: Internal Medicine

## 2012-10-20 DIAGNOSIS — N400 Enlarged prostate without lower urinary tract symptoms: Secondary | ICD-10-CM

## 2012-10-22 ENCOUNTER — Telehealth: Payer: Self-pay | Admitting: Internal Medicine

## 2012-10-22 NOTE — Telephone Encounter (Signed)
Dr. Kirtland Bouchard - patient came in and filled out request for the MRI imaging to be sent to you - Dr. Darrelyn Hillock mentioned a cortisone shot in the tailbone region. Carlos David wants to know if you think that's ok for that area, and if it is something you can do here, or if he should have it at [facility]. Please advise, and I can let him know. Thank you.

## 2012-10-22 NOTE — Telephone Encounter (Signed)
Patient notified

## 2012-10-22 NOTE — Telephone Encounter (Signed)
Needs to see orthopedic physician  for cortisone injection to this area

## 2012-10-27 ENCOUNTER — Telehealth: Payer: Self-pay | Admitting: Internal Medicine

## 2012-10-27 NOTE — Telephone Encounter (Signed)
Pt would like you to call him and inform him on exactly why he is being sent to Urologist. Just to confirm reasons. [phone]

## 2012-11-12 ENCOUNTER — Telehealth: Payer: Self-pay | Admitting: Internal Medicine

## 2012-11-12 NOTE — Telephone Encounter (Signed)
Patient Information:  Caller Name: Freddy  Phone: [phone]  Patient: Carlos David, Carlos David  Gender: Male  DOB: 05-Apr-1946  Age: 66 Years  PCP: Eleonore Chiquito [facility])  Office Follow Up:  Does the office need to follow up with this patient?: Yes  Instructions For The Office: Requesting appointment today 11/12/12 with Dr. Lesia Hausen. Please call back if he can be worked in.   Symptoms  Reason For Call & Symptoms: Call Reason: Lower abdominal pain onset 11/11/12. 2-3 weeks ago had some pain and went away. Has trouble passing stool and pain in lower abdomen with urination. Had bm today was normal and pain is much better.  Has appnt on 11/16/12 with Urology. Headache overnight- better this morning.  Reviewed Health History In EMR: Yes  Reviewed Medications In EMR: Yes  Reviewed Allergies In EMR: Yes  Reviewed Surgeries / Procedures: Yes  Date of Onset of Symptoms: 11/11/2012  Guideline(s) Used:  Abdominal Pain - Male  Disposition Per Guideline:   See Today in Office  Reason For Disposition Reached:   Mild pain that comes and goes (cramps) lasts > 24 hours  Advice Given:  Reassurance:  A mild stomachache can be caused by indigestion, gas pains or overeating. Sometimes a stomachache signals the onset of a vomiting illness due to a viral gastroenteritis ("stomach flu").  Fluids:  Sip clear fluids only (e.g., water, flat soft drinks or half-strength fruit juice) until the pain has been gone for over 2 hours. Then slowly return to a regular diet.  Pass A BM:  Sit on the toilet and try to pass a bowel movement (BM). Do not strain. This may relieve pain if it is due to constipation or impending diarrhea.  Diet:  Avoid alcohol or caffeinated beverages  Avoid NSAIDS and Aspirin  : Avoid any drug that can irritate the stomach lining and make the pain worse (especially aspirin and NSAIDs like ibuprofen).  Call Back If:  Abdominal pain is constant and present for more than 2  hours  Abdominal pains come and go and are present for more than 24 hours  You become worse.

## 2012-11-12 NOTE — Telephone Encounter (Signed)
Spoke to pt, asked  how he is feeling? Pt stated a little better, cramps have subsided but stomach is sore. told him Dr. Amador Cunas is out of the office until Monday, will schedule him to come in then. Pt verbalized understanding. Told pt if pain becomes severe again needs to go to urgent care of ED. Pt verbalized understanding. Transferred to schedule appt.

## 2012-11-15 ENCOUNTER — Encounter: Payer: Self-pay | Admitting: Internal Medicine

## 2012-11-15 ENCOUNTER — Ambulatory Visit (INDEPENDENT_AMBULATORY_CARE_PROVIDER_SITE_OTHER): Payer: Medicare Other | Admitting: Internal Medicine

## 2012-11-15 VITALS — BP 150/90 | HR 68 | Temp 97.6°F | Resp 18 | Wt 205.0 lb

## 2012-11-15 DIAGNOSIS — Z85850 Personal history of malignant neoplasm of thyroid: Secondary | ICD-10-CM

## 2012-11-15 DIAGNOSIS — M1990 Unspecified osteoarthritis, unspecified site: Secondary | ICD-10-CM

## 2012-11-15 NOTE — Patient Instructions (Signed)
Followup urology as discussed  Call or return to clinic prn if these symptoms worsen or fail to improve as anticipated.

## 2012-11-15 NOTE — Progress Notes (Signed)
Subjective:    Patient ID: Carlos David, male    DOB: 09-02-1946, 66 y.o.   MRN: [id number]  HPI  66 year old patient who is seen today with a chief complaint of lower abdominal pain. He states that he had an initial episode 3 or 4 weeks ago that resolved. 4 months ago he had severe lower mid crampy abdominal pain that persisted for about one day this was associated with the constipation that improved with stool softeners. Pain was described as a severe crampy pain in the lower mid abdominal area that was relieved by urination. He did have a colonoscopy with the [facility] in 2006. He is scheduled to see urology tomorrow do to abnormalities on a MRI that suggested prostatic enlargement and possible evidence of bladder outlet obstruction. Today he is symptom-free. He continues to have coccydynia but this seems to be improved. Denies any hematuria or history of kidney stones  Past Medical History  Diagnosis Date  . HYPOTHYROIDISM 12/26/2008  . HYPERLIPIDEMIA 12/26/2008  . THYROID CANCER, HX OF 12/26/2008  . PPD positive     History   Social History  . Marital Status: Married    Spouse Name: N/A    Number of Children: N/A  . Years of Education: N/A   Occupational History  . Not on file.   Social History Main Topics  . Smoking status: Former Smoker    Types: Cigarettes, Pipe    Quit date: 11/17/1968  . Smokeless tobacco: Not on file  . Alcohol Use: Not on file  . Drug Use: No  . Sexually Active: Not on file   Other Topics Concern  . Not on file   Social History Narrative  . No narrative on file    No past surgical history on file.  No family history on file.  Allergies  Allergen Reactions  . Penicillins     Current Outpatient Prescriptions on File Prior to Visit  Medication Sig Dispense Refill  . aspirin 81 MG tablet Take 81 mg by mouth daily.        Marland Kitchen atorvastatin (LIPITOR) 20 MG tablet Take 1 tablet (20 mg total) by mouth daily.  90 tablet  6  . glucosamine-chondroitin  500-400 MG tablet Take 3 tablets by mouth daily.       Marland Kitchen levothyroxine (SYNTHROID, LEVOTHROID) 137 MCG tablet Take 1 tablet (137 mcg total) by mouth daily.  90 tablet  6  . sildenafil (VIAGRA) 100 MG tablet Take 1 tablet (100 mg total) by mouth as needed for erectile dysfunction.  30 tablet  6  . tadalafil (CIALIS) 20 MG tablet Take 20 mg by mouth daily as needed.          BP 150/90  Pulse 68  Temp 97.6 F (36.4 C) (Oral)  Resp 18  Wt 205 lb (92.987 kg)  SpO2 99%       Review of Systems  Constitutional: Negative for fever, chills, appetite change and fatigue.  HENT: Negative for hearing loss, ear pain, congestion, sore throat, trouble swallowing, neck stiffness, dental problem, voice change and tinnitus.   Eyes: Negative for pain, discharge and visual disturbance.  Respiratory: Negative for cough, chest tightness, wheezing and stridor.   Cardiovascular: Negative for chest pain, palpitations and leg swelling.  Gastrointestinal: Positive for abdominal pain. Negative for nausea, vomiting, diarrhea, constipation, blood in stool and abdominal distention.  Genitourinary: Negative for urgency, hematuria, flank pain, discharge, difficulty urinating and genital sores.  Musculoskeletal: Negative for myalgias, back pain, joint  swelling, arthralgias and gait problem.  Skin: Negative for rash.  Neurological: Negative for dizziness, syncope, speech difficulty, weakness, numbness and headaches.  Hematological: Negative for adenopathy. Does not bruise/bleed easily.  Psychiatric/Behavioral: Negative for behavioral problems and dysphoric mood. The patient is not nervous/anxious.        Objective:   Physical Exam  Constitutional: He is oriented to person, place, and time. He appears well-developed.  HENT:  Head: Normocephalic.  Right Ear: External ear normal.  Left Ear: External ear normal.  Eyes: Conjunctivae normal and EOM are normal.  Neck: Normal range of motion.  Cardiovascular: Normal  rate and normal heart sounds.   Pulmonary/Chest: Breath sounds normal.  Abdominal: Soft. Bowel sounds are normal. He exhibits no distension and no mass. There is no tenderness. There is no rebound and no guarding.  Musculoskeletal: Normal range of motion. He exhibits no edema and no tenderness.  Neurological: He is alert and oriented to person, place, and time.  Psychiatric: He has a normal mood and affect. His behavior is normal.          Assessment & Plan:  History of crampy lower mid abdominal pain. Pain has resolved in the clinical exam is unremarkable. 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 He is scheduled to see urology tomorrow. History thyroid cancer Coccydynia   Schedule CPX in 4 months Urology followup as scheduled

## 2012-12-20 ENCOUNTER — Telehealth: Payer: Self-pay | Admitting: Internal Medicine

## 2012-12-20 NOTE — Telephone Encounter (Signed)
Patient Information:  Caller Name: Vignesh  Phone: [phone]  Patient: Carlos David, Carlos David  Gender: Male  DOB: 1946-06-26  Age: 67 Years  PCP: Eleonore Chiquito [facility])  Office Follow Up:  Does the office need to follow up with this patient?: Yes  Instructions For The Office: No available appt, please f/u with pt regarding appt need.   Symptoms  Reason For Call & Symptoms: Tiredness, odd taste in mouth  Reviewed Health History In EMR: Yes  Reviewed Medications In EMR: Yes  Reviewed Allergies In EMR: Yes  Reviewed Surgeries / Procedures: Yes  Date of Onset of Symptoms: 12/17/2012  Treatments Tried: Zicam  Treatments Tried Worked: No  Guideline(s) Used:  Weakness (Generalized) and Fatigue  Disposition Per Guideline:   Go to Office Now  Reason For Disposition Reached:   Moderate weakness (i.e., interferes with work, school, normal activities) and cause unknown  Advice Given:  Call Back If:  Unable to stand or walk  You become worse.

## 2012-12-22 NOTE — Telephone Encounter (Signed)
Spoke to pt asked him how he was feeling? Pt stated he is feeling better, denies fever, slight head congestion and some abd discomfort off and on but is feeling better. Told pt okay if becomes worse needs to call office for an appointment. Pt verbalized understanding.

## 2012-12-23 ENCOUNTER — Other Ambulatory Visit: Payer: Self-pay | Admitting: Internal Medicine

## 2012-12-27 ENCOUNTER — Ambulatory Visit (INDEPENDENT_AMBULATORY_CARE_PROVIDER_SITE_OTHER): Payer: Medicare Other | Admitting: *Deleted

## 2012-12-27 DIAGNOSIS — Z23 Encounter for immunization: Secondary | ICD-10-CM

## 2013-01-03 ENCOUNTER — Telehealth: Payer: Self-pay | Admitting: Internal Medicine

## 2013-01-03 MED ORDER — ATORVASTATIN CALCIUM 20 MG PO TABS: 20.0000 mg | ORAL_TABLET | Freq: Every day | ORAL | Status: DC

## 2013-01-03 NOTE — Telephone Encounter (Signed)
Pt notified Rx refill done.

## 2013-01-03 NOTE — Telephone Encounter (Signed)
Patient called stating that the nurse was supposed to call in his atorvastatin 20 mg 1poqd and need it sent to [facility] on [address]. Please assist.

## 2013-02-16 ENCOUNTER — Other Ambulatory Visit (INDEPENDENT_AMBULATORY_CARE_PROVIDER_SITE_OTHER): Payer: Medicare Other

## 2013-02-16 DIAGNOSIS — N410 Acute prostatitis: Secondary | ICD-10-CM

## 2013-02-16 DIAGNOSIS — E785 Hyperlipidemia, unspecified: Secondary | ICD-10-CM

## 2013-02-16 DIAGNOSIS — Z0000 Encounter for general adult medical examination without abnormal findings: Secondary | ICD-10-CM

## 2013-02-16 DIAGNOSIS — E039 Hypothyroidism, unspecified: Secondary | ICD-10-CM

## 2013-02-16 LAB — HEPATIC FUNCTION PANEL
ALT: 33 U/L (ref 0–53)
Albumin: 4.4 g/dL (ref 3.5–5.2)
Bilirubin, Direct: 0.3 mg/dL (ref 0.0–0.3)
Total Protein: 7.1 g/dL (ref 6.0–8.3)

## 2013-02-16 LAB — CBC WITH DIFFERENTIAL/PLATELET
Basophils Relative: 0.5 % (ref 0.0–3.0)
Eosinophils Relative: 2.6 % (ref 0.0–5.0)
HCT: 44.6 % (ref 39.0–52.0)
Hemoglobin: 15.1 g/dL (ref 13.0–17.0)
Lymphs Abs: 1.5 10*3/uL (ref 0.7–4.0)
MCHC: 33.9 g/dL (ref 30.0–36.0)
MCV: 86.6 fl (ref 78.0–100.0)
Monocytes Absolute: 0.6 10*3/uL (ref 0.1–1.0)
Neutro Abs: 3 10*3/uL (ref 1.4–7.7)
RBC: 5.15 Mil/uL (ref 4.22–5.81)
WBC: 5.2 10*3/uL (ref 4.5–10.5)

## 2013-02-16 LAB — LIPID PANEL
Cholesterol: 181 mg/dL (ref 0–200)
LDL Cholesterol: 105 mg/dL — ABNORMAL HIGH (ref 0–99)
VLDL: 12.8 mg/dL (ref 0.0–40.0)

## 2013-02-16 LAB — POCT URINALYSIS DIPSTICK
Ketones, UA: NEGATIVE
Protein, UA: NEGATIVE
Spec Grav, UA: 1.025
pH, UA: 5.5

## 2013-02-16 LAB — BASIC METABOLIC PANEL
CO2: 28 mEq/L (ref 19–32)
Chloride: 103 mEq/L (ref 96–112)
Potassium: 4.5 mEq/L (ref 3.5–5.1)

## 2013-02-23 ENCOUNTER — Ambulatory Visit (INDEPENDENT_AMBULATORY_CARE_PROVIDER_SITE_OTHER): Payer: Medicare Other | Admitting: Internal Medicine

## 2013-02-23 ENCOUNTER — Encounter: Payer: Self-pay | Admitting: Internal Medicine

## 2013-02-23 VITALS — BP 122/80 | HR 64 | Temp 97.6°F | Resp 20 | Ht 68.25 in | Wt 203.0 lb

## 2013-02-23 DIAGNOSIS — M1990 Unspecified osteoarthritis, unspecified site: Secondary | ICD-10-CM

## 2013-02-23 DIAGNOSIS — Z0000 Encounter for general adult medical examination without abnormal findings: Secondary | ICD-10-CM

## 2013-02-23 DIAGNOSIS — E785 Hyperlipidemia, unspecified: Secondary | ICD-10-CM

## 2013-02-23 DIAGNOSIS — Z85850 Personal history of malignant neoplasm of thyroid: Secondary | ICD-10-CM

## 2013-02-23 DIAGNOSIS — E039 Hypothyroidism, unspecified: Secondary | ICD-10-CM

## 2013-02-23 MED ORDER — LEVOTHYROXINE SODIUM 137 MCG PO TABS: ORAL_TABLET | ORAL | Status: DC

## 2013-02-23 MED ORDER — ATORVASTATIN CALCIUM 20 MG PO TABS: 20.0000 mg | ORAL_TABLET | Freq: Every day | ORAL | Status: DC

## 2013-02-23 NOTE — Progress Notes (Signed)
Patient ID: Carlos David, male   DOB: 1946-11-14, 66 y.o.   MRN: [id number]  Subjective:    Patient ID: Carlos David, male    DOB: [birth year], 67 y.o.   MRN: [id number]  HPI  History of Present Illness:   67 year-old patient who is seen today for a wellness exam. He enjoys excellent health;  history of hypothyroidism and mild dyslipidemia. No concerns or complaints. Today ; has been evaluated recently by  urology  Here for Medicare AWV:  1. Risk factors based on Past M, S, F history: cardiovascular risk factors include a history of dyslipidemia; mother had an MI at age 54  2. Physical Activities: goes to his health club at least 3 times per week  3. Depression/mood: no history of depression or mood disorder  4. Hearing: no hearing deficits  5. ADL's: independent in all aspects of daily living  6. Fall Risk: low  7. Home Safety: no problems identified  8. Height, weight, &visual acuity: height and weight stable. No difficulty with visual acuity  9. Counseling: heart healthy diet regular exercise. All encouraged  10. Labs ordered based on risk factors: laboratory profile including lipid panel will be reviewed. A TSH will be reviewed  11. Referral Coordination- not appropriate at this time  12. Care Plan- continue heart healthy diet and regular. Exercise regimen  13. Cognitive Assessment- alert and oriented, with normal affect. No cognitive dysfunction ; handles all executive functions without difficulty   Allergies:  1) ! 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  Father died age mid 1s- MVA, 42, TB,  Mother- MI age 24, tobacco  1 brother-obesity    Social History:  Reviewed history from 12/26/2008 and no changes required.  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  HENT: Negative for hearing loss, ear pain, congestion, rhinorrhea, sneezing, mouth sores, trouble swallowing, neck pain, neck stiffness, dental problem, voice change, sinus pressure and tinnitus.   Eyes: Negative for photophobia, pain, redness and visual disturbance.  Respiratory: Negative for apnea, cough, choking, chest tightness, shortness of breath and wheezing.   Cardiovascular: Negative for chest pain, palpitations and leg swelling.  Gastrointestinal: Negative for nausea, vomiting, abdominal pain, diarrhea, constipation, blood in stool, abdominal distention, anal bleeding and rectal pain.  Genitourinary: Negative for dysuria, urgency, frequency, hematuria, flank pain, decreased urine volume, discharge, penile swelling, scrotal swelling, difficulty urinating, genital sores and testicular pain.  Musculoskeletal: Positive for joint swelling (mild swelling in the small joints of the hands). Negative for myalgias, back pain, arthralgias and gait problem.  Skin: Negative for color change, rash and wound.  Neurological: Negative for dizziness, tremors, seizures, syncope, facial asymmetry, speech difficulty, weakness, light-headedness, numbness and headaches.  Hematological: Negative for adenopathy. Does not bruise/bleed easily.  Psychiatric/Behavioral: Negative for suicidal ideas, hallucinations, behavioral problems, confusion, sleep disturbance, self-injury, dysphoric mood, decreased concentration and agitation. The patient is not nervous/anxious.        Objective:   Physical Exam  Constitutional: He appears well-developed and well-nourished.  HENT:  Head: Normocephalic and atraumatic.  Right Ear: External  ear normal.  Left Ear: External ear normal.   Nose: Nose normal.  Mouth/Throat: Oropharynx is clear and moist.  Eyes: Conjunctivae and EOM are normal. Pupils are equal, round, and reactive to light. No scleral icterus.  Neck: Normal range of motion. Neck supple. No JVD present. No thyromegaly present.  Surgical scars noted in the anterior and left lateral neck regions  Cardiovascular: Regular rhythm, normal heart sounds and intact distal pulses.  Exam reveals no gallop and no friction rub.   No murmur heard. Dorsalis pedis pulses faint. Posterior tibial pulses full  Pulmonary/Chest: Effort normal and breath sounds normal. He exhibits no tenderness.  Abdominal: Soft. Bowel sounds are normal. He exhibits no distension and no mass. There is no tenderness.  Genitourinary: Prostate normal and penis normal.  Musculoskeletal: Normal range of motion. He exhibits no edema and no tenderness.  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  Neurological: He is alert. He has normal reflexes. No cranial nerve deficit. Coordination normal.  Skin: Skin is warm and dry. No rash noted.  Psychiatric: He has a normal mood and affect. His behavior is normal.          Assessment & Plan:   Preventive health examination  Hypothyroidism  Status post thyroidectomy. TSH normal on thyroid replacement  Dyslipidemia well controlled we'll continue atorvastatin   Recheck one year

## 2013-02-23 NOTE — Patient Instructions (Signed)
Limit your sodium (Salt) intake    It is important that you exercise regularly, at least 20 minutes 3 to 4 times per week.  If you develop chest pain or shortness of breath seek  medical attention.  Return in one year for follow-up   

## 2013-09-15 ENCOUNTER — Encounter: Payer: Self-pay | Admitting: Internal Medicine

## 2013-09-15 ENCOUNTER — Ambulatory Visit (INDEPENDENT_AMBULATORY_CARE_PROVIDER_SITE_OTHER): Payer: Medicare Other | Admitting: Internal Medicine

## 2013-09-15 VITALS — BP 142/90 | HR 64 | Temp 98.0°F | Resp 20 | Wt 200.0 lb

## 2013-09-15 DIAGNOSIS — M79672 Pain in left foot: Secondary | ICD-10-CM

## 2013-09-15 DIAGNOSIS — E039 Hypothyroidism, unspecified: Secondary | ICD-10-CM

## 2013-09-15 DIAGNOSIS — M1990 Unspecified osteoarthritis, unspecified site: Secondary | ICD-10-CM

## 2013-09-15 DIAGNOSIS — M79609 Pain in unspecified limb: Secondary | ICD-10-CM

## 2013-09-15 DIAGNOSIS — E785 Hyperlipidemia, unspecified: Secondary | ICD-10-CM

## 2013-09-15 NOTE — Progress Notes (Signed)
Subjective:    Patient ID: Carlos David, male    DOB: 10-Mar-1946, 67 y.o.   MRN: [id number]  HPI  67 year old patient who is seen today for followup. He has a history of hypothyroidism and dyslipidemia and is seen  infrequently. He has a long history of foot pain and has been seen over the years by 3 different podiatry groups in town. He is also been seen at [facility] orthopedics. He complains of pain mainly involving the left lateral foot and over the MTP joints. He has been helped by orthotics and braces in the past.  BP Readings from Last 3 Encounters:  09/15/13 142/90  02/23/13 122/80  11/15/12 150/90   Past Medical History  Diagnosis Date  . HYPOTHYROIDISM 12/26/2008  . HYPERLIPIDEMIA 12/26/2008  . THYROID CANCER, HX OF 12/26/2008  . PPD positive     History   Social History  . Marital Status: Married    Spouse Name: N/A    Number of Children: N/A  . Years of Education: N/A   Occupational History  . Not on file.   Social History Main Topics  . Smoking status: Former Smoker    Types: Cigarettes, Pipe    Quit date: 11/17/1968  . Smokeless tobacco: Not on file  . Alcohol Use: Not on file  . Drug Use: No  . Sexual Activity: Not on file   Other Topics Concern  . Not on file   Social History Narrative  . No narrative on file    History reviewed. No pertinent past surgical history.  No family history on file.  Allergies  Allergen Reactions  . Penicillins     Current Outpatient Prescriptions on File Prior to Visit  Medication Sig Dispense Refill  . aspirin 81 MG tablet Take 81 mg by mouth daily.        Marland Kitchen atorvastatin (LIPITOR) 20 MG tablet Take 1 tablet (20 mg total) by mouth daily.  90 tablet  6  . glucosamine-chondroitin 500-400 MG tablet Take 3 tablets by mouth daily.       Marland Kitchen levothyroxine (SYNTHROID, LEVOTHROID) 137 MCG tablet TAKE ONE TABLET BY MOUTH EVERY DAY  90 tablet  6  . sildenafil (VIAGRA) 100 MG tablet Take 1 tablet (100 mg total) by mouth as needed  for erectile dysfunction.  30 tablet  6   No current facility-administered medications on file prior to visit.    BP 142/90  Pulse 64  Temp(Src) 98 F (36.7 C) (Oral)  Resp 20  Wt 200 lb (90.719 kg)  BMI 30.17 kg/m2  SpO2 99%      Review of Systems  Constitutional: Negative for fever, chills, appetite change and fatigue.  HENT: Negative for congestion, dental problem, ear pain, hearing loss, sore throat, tinnitus, trouble swallowing and voice change.   Eyes: Negative for pain, discharge and visual disturbance.  Respiratory: Negative for cough, chest tightness, wheezing and stridor.   Cardiovascular: Negative for chest pain, palpitations and leg swelling.  Gastrointestinal: Negative for nausea, vomiting, abdominal pain, diarrhea, constipation, blood in stool and abdominal distention.  Genitourinary: Negative for urgency, hematuria, flank pain, discharge, difficulty urinating and genital sores.  Musculoskeletal: Positive for gait problem. Negative for arthralgias, back pain, joint swelling, myalgias and neck stiffness.       Left foot pain  Skin: Negative for rash.  Neurological: Negative for dizziness, syncope, speech difficulty, weakness, numbness and headaches.  Hematological: Negative for adenopathy. Does not bruise/bleed easily.  Psychiatric/Behavioral: Negative for behavioral problems and dysphoric  mood. The patient is not nervous/anxious.        Objective:   Physical Exam  Constitutional: He is oriented to person, place, and time. He appears well-developed.  HENT:  Head: Normocephalic.  Right Ear: External ear normal.  Left Ear: External ear normal.  Eyes: Conjunctivae and EOM are normal.  Neck: Normal range of motion.  Cardiovascular: Normal rate and normal heart sounds.   Pulmonary/Chest: Breath sounds normal.  Abdominal: Bowel sounds are normal.  Musculoskeletal: Normal range of motion. He exhibits no edema and no tenderness.  Posture arthritic changes  involving small joints of the hands. Examination left foot reveal no overt abnormalities involving the lateral foot or the balls of the foot  Neurological: He is alert and oriented to person, place, and time.  Psychiatric: He has a normal mood and affect. His behavior is normal.          Assessment & Plan:   Chronic left foot pain. Options were discussed. He wishes to pursue orthopedic evaluation Dyslipidemia   CPX 6 months

## 2013-09-15 NOTE — Patient Instructions (Signed)
Orthopedic referral as discussed 

## 2013-10-12 ENCOUNTER — Ambulatory Visit (INDEPENDENT_AMBULATORY_CARE_PROVIDER_SITE_OTHER): Payer: Medicare Other | Admitting: Internal Medicine

## 2013-10-12 ENCOUNTER — Encounter: Payer: Self-pay | Admitting: Internal Medicine

## 2013-10-12 VITALS — BP 130/80 | HR 84 | Temp 98.2°F | Resp 20 | Wt 206.0 lb

## 2013-10-12 DIAGNOSIS — J069 Acute upper respiratory infection, unspecified: Secondary | ICD-10-CM

## 2013-10-12 MED ORDER — HYDROCODONE-HOMATROPINE 5-1.5 MG/5ML PO SYRP: 5.0000 mL | ORAL_SOLUTION | Freq: Four times a day (QID) | ORAL | Status: AC | PRN

## 2013-10-12 NOTE — Patient Instructions (Signed)
Acute bronchitis symptoms for less than 10 days are generally not helped by antibiotics.  Take over-the-counter expectorants and cough medications such as  Mucinex DM.  Call if there is no improvement in 5 to 7 days or if he developed worsening cough, fever, or new symptoms, such as shortness of breath or chest pain.  Return in 6 months for follow-up  

## 2013-10-12 NOTE — Progress Notes (Signed)
Pre-visit discussion using our clinic review tool. No additional management support is needed unless otherwise documented below in the visit note.  

## 2013-10-12 NOTE — Progress Notes (Signed)
Subjective:    Patient ID: Carlos David, male    DOB: 01-16-1946, 67 y.o.   MRN: [id number]  HPI  67 year old patient who presents with a two-day history of cough congestion and mild headache and sore throat. Symptoms occurred 2 days after care and for a grandchild who had mild illness. There's been no fever or productive cough. History of dyslipidemia which has been stable.  Past Medical History  Diagnosis Date  . HYPOTHYROIDISM 12/26/2008  . HYPERLIPIDEMIA 12/26/2008  . THYROID CANCER, HX OF 12/26/2008  . PPD positive     History   Social History  . Marital Status: Married    Spouse Name: N/A    Number of Children: N/A  . Years of Education: N/A   Occupational History  . Not on file.   Social History Main Topics  . Smoking status: Former Smoker    Types: Cigarettes, Pipe    Quit date: 11/17/1968  . Smokeless tobacco: Not on file  . Alcohol Use: Not on file  . Drug Use: No  . Sexual Activity: Not on file   Other Topics Concern  . Not on file   Social History Narrative  . No narrative on file    History reviewed. No pertinent past surgical history.  No family history on file.  Allergies  Allergen Reactions  . Penicillins     Current Outpatient Prescriptions on File Prior to Visit  Medication Sig Dispense Refill  . aspirin 81 MG tablet Take 81 mg by mouth daily.        Marland Kitchen atorvastatin (LIPITOR) 20 MG tablet Take 1 tablet (20 mg total) by mouth daily.  90 tablet  6  . glucosamine-chondroitin 500-400 MG tablet Take 3 tablets by mouth daily.       Marland Kitchen levothyroxine (SYNTHROID, LEVOTHROID) 137 MCG tablet TAKE ONE TABLET BY MOUTH EVERY DAY  90 tablet  6  . sildenafil (VIAGRA) 100 MG tablet Take 1 tablet (100 mg total) by mouth as needed for erectile dysfunction.  30 tablet  6   No current facility-administered medications on file prior to visit.    BP 130/80  Pulse 84  Temp(Src) 98.2 F (36.8 C) (Oral)  Resp 20  Wt 206 lb (93.441 kg)  SpO2 98%       Review  of Systems  Constitutional: Negative for fever, chills, appetite change and fatigue.  HENT: Positive for postnasal drip, rhinorrhea and sore throat. Negative for congestion, dental problem, ear pain, hearing loss, tinnitus, trouble swallowing and voice change.   Eyes: Negative for pain, discharge and visual disturbance.  Respiratory: Positive for cough. Negative for chest tightness, wheezing and stridor.   Cardiovascular: Negative for chest pain, palpitations and leg swelling.  Gastrointestinal: Negative for nausea, vomiting, abdominal pain, diarrhea, constipation, blood in stool and abdominal distention.  Genitourinary: Negative for urgency, hematuria, flank pain, discharge, difficulty urinating and genital sores.  Musculoskeletal: Negative for arthralgias, back pain, gait problem, joint swelling, myalgias and neck stiffness.  Skin: Negative for rash.  Neurological: Negative for dizziness, syncope, speech difficulty, weakness, numbness and headaches.  Hematological: Negative for adenopathy. Does not bruise/bleed easily.  Psychiatric/Behavioral: Negative for behavioral problems and dysphoric mood. The patient is not nervous/anxious.        Objective:   Physical Exam  Constitutional: He is oriented to person, place, and time. He appears well-developed.  HENT:  Head: Normocephalic.  Right Ear: External ear normal.  Left Ear: External ear normal.  Eyes: Conjunctivae and EOM are normal.  Neck: Normal range of motion.  Cardiovascular: Normal rate and normal heart sounds.   Pulmonary/Chest: Breath sounds normal.  Abdominal: Bowel sounds are normal.  Musculoskeletal: Normal range of motion. He exhibits no edema and no tenderness.  Neurological: He is alert and oriented to person, place, and time.  Psychiatric: He has a normal mood and affect. His behavior is normal.          Assessment & Plan:   Viral URI with cough. Will treat symptomatically Dyslipidemia. Will continue  atorvastatin

## 2013-10-14 ENCOUNTER — Encounter: Payer: Self-pay | Admitting: Family Medicine

## 2013-10-14 ENCOUNTER — Ambulatory Visit (INDEPENDENT_AMBULATORY_CARE_PROVIDER_SITE_OTHER): Payer: Medicare Other | Admitting: Family Medicine

## 2013-10-14 VITALS — BP 120/80 | HR 77 | Temp 98.1°F | Wt 203.0 lb

## 2013-10-14 DIAGNOSIS — J209 Acute bronchitis, unspecified: Secondary | ICD-10-CM

## 2013-10-14 DIAGNOSIS — M546 Pain in thoracic spine: Secondary | ICD-10-CM

## 2013-10-14 NOTE — Progress Notes (Signed)
Pre visit review using our clinic review tool, if applicable. No additional management support is needed unless otherwise documented below in the visit note. 

## 2013-10-14 NOTE — Progress Notes (Signed)
   Subjective:    Patient ID: Carlos David, male    DOB: Mar 08, 1946, 67 y.o.   MRN: [id number]  HPI  Is seen with persistent cough. Was seen just 2 days ago and diagnosed with probable viral illness. He has a low-grade fever after leaving here but has had none today. He has bilateral thoracic back pain with coughing. His job requires lots of lifting and he thinks he may have strained this a few days ago. Cough is nonproductive. Minimal nasal congestion. No sore throat. No nausea or vomiting. No dyspnea. He is taking Hycodan cough syrup at night which helps. He is taking Advil for his back pain which helps. No pleuritic pain  Past Medical History  Diagnosis Date  . HYPOTHYROIDISM 12/26/2008  . HYPERLIPIDEMIA 12/26/2008  . THYROID CANCER, HX OF 12/26/2008  . PPD positive    No past surgical history on file.  reports that he quit smoking about 44 years ago. His smoking use included Cigarettes and Pipe. He smoked 0.00 packs per day. He does not have any smokeless tobacco history on file. He reports that he does not use illicit drugs. His alcohol history is not on file. family history is not on file. Allergies  Allergen Reactions  . Penicillins      Review of Systems  Constitutional: Negative for fever and chills.  HENT: Negative for sore throat.   Respiratory: Positive for cough. Negative for shortness of breath and wheezing.   Neurological: Negative for headaches.       Objective:   Physical Exam  Constitutional: He appears well-developed and well-nourished.  HENT:  Right Ear: External ear normal.  Left Ear: External ear normal.  Mouth/Throat: Oropharynx is clear and moist.  Neck: Neck supple.  Cardiovascular: Normal rate and regular rhythm.   Pulmonary/Chest: Effort normal and breath sounds normal. No respiratory distress. He has no wheezes. He has no rales.  Lymphadenopathy:    He has no cervical adenopathy.          Assessment & Plan:  #1 acute bronchitis. Lung exam is  normal and he is afebrile. Reassurance this likely resolving viral process. Continue Hycodan as needed for cough at night he'll try Mucinex during the day #2 bilateral midthoracic back pain. Suspect muscular. Nonfocal exam. Continue Advil as needed

## 2013-10-14 NOTE — Patient Instructions (Signed)
Acute Bronchitis Bronchitis is inflammation of the airways that extend from the windpipe into the lungs (bronchi). The inflammation often causes mucus to develop. This leads to a cough, which is the most common symptom of bronchitis.  In acute bronchitis, the condition usually develops suddenly and goes away over time, usually in a couple weeks. Smoking, allergies, and asthma can make bronchitis worse. Repeated episodes of bronchitis may cause further lung problems.  CAUSES Acute bronchitis is most often caused by the same virus that causes a cold. The virus can spread from person to person (contagious).  SIGNS AND SYMPTOMS   Cough.   Fever.   Coughing up mucus.   Body aches.   Chest congestion.   Chills.   Shortness of breath.   Sore throat.  DIAGNOSIS  Acute bronchitis is usually diagnosed through a physical exam. Tests, such as chest X-rays, are sometimes done to rule out other conditions.  TREATMENT  Acute bronchitis usually goes away in a couple weeks. Often times, no medical treatment is necessary. Medicines are sometimes given for relief of fever or cough. Antibiotics are usually not needed but may be prescribed in certain situations. In some cases, an inhaler may be recommended to help reduce shortness of breath and control the cough. A cool mist vaporizer may also be used to help thin bronchial secretions and make it easier to clear the chest.  HOME CARE INSTRUCTIONS  Get plenty of rest.   Drink enough fluids to keep your urine clear or pale yellow (unless you have a medical condition that requires fluid restriction). Increasing fluids may help thin your secretions and will prevent dehydration.   Only take over-the-counter or prescription medicines as directed by your health care provider.   Avoid smoking and secondhand smoke. Exposure to cigarette smoke or irritating chemicals will make bronchitis worse. If you are a smoker, consider using nicotine gum or skin  patches to help control withdrawal symptoms. Quitting smoking will help your lungs heal faster.   Reduce the chances of another bout of acute bronchitis by washing your hands frequently, avoiding people with cold symptoms, and trying not to touch your hands to your mouth, nose, or eyes.   Follow up with your health care provider as directed.  SEEK MEDICAL CARE IF: Your symptoms do not improve after 1 week of treatment.  SEEK IMMEDIATE MEDICAL CARE IF:  You develop an increased fever or chills.   You have chest pain.   You have severe shortness of breath.  You have bloody sputum.   You develop dehydration.  You develop fainting.  You develop repeated vomiting.  You develop a severe headache. MAKE SURE YOU:   Understand these instructions.  Will watch your condition.  Will get help right away if you are not doing well or get worse. Document Released: 12/11/2004 Document Revised: 07/06/2013 Document Reviewed: 04/26/2013 [employer] Patient Information 2014 [employer]  

## 2013-11-24 ENCOUNTER — Ambulatory Visit (INDEPENDENT_AMBULATORY_CARE_PROVIDER_SITE_OTHER): Payer: Medicare HMO | Admitting: Internal Medicine

## 2013-11-24 ENCOUNTER — Encounter: Payer: Self-pay | Admitting: Internal Medicine

## 2013-11-24 VITALS — BP 142/98 | Temp 98.1°F | Wt 204.0 lb

## 2013-11-24 DIAGNOSIS — J019 Acute sinusitis, unspecified: Secondary | ICD-10-CM

## 2013-11-24 DIAGNOSIS — Z23 Encounter for immunization: Secondary | ICD-10-CM

## 2013-11-24 DIAGNOSIS — J988 Other specified respiratory disorders: Secondary | ICD-10-CM

## 2013-11-24 DIAGNOSIS — J22 Unspecified acute lower respiratory infection: Secondary | ICD-10-CM

## 2013-11-24 MED ORDER — CEFUROXIME AXETIL 500 MG PO TABS: 500.0000 mg | ORAL_TABLET | Freq: Two times a day (BID) | ORAL | Status: DC

## 2013-11-24 NOTE — Addendum Note (Signed)
Addended by: Miles Costain T on: 11/24/2013 12:20 PM   Modules accepted: Orders

## 2013-11-24 NOTE — Patient Instructions (Addendum)
Your chest exam is clear no evidence of pneumonia. Most sinusitis resolves on its and after a week however because this is been going on for 2 weeks and getting worse we will add antibiotic Use saline nose spray for irrigation and short-term Afrin decongestant D. creased the pressure in the face.  The antibiotic giving you as a theoretical cross reaction with penicillin but it would be very rare to occur.  Expect significant improvement within 3-5 days of the face head pressure. Flu vaccine today recommended takes 2 weeks to get immunity. Even with the mismatch straightens this year ; should get about 40% ejection with this vaccine  Sinusitis Sinusitis is redness, soreness, and swelling (inflammation) of the paranasal sinuses. Paranasal sinuses are air pockets within the bones of your face (beneath the eyes, the middle of the forehead, or above the eyes). In healthy paranasal sinuses, mucus is able to drain out, and air is able to circulate through them by way of your nose. However, when your paranasal sinuses are inflamed, mucus and air can become trapped. This can allow bacteria and other germs to grow and cause infection. Sinusitis can develop quickly and last only a short time (acute) or continue over a long period (chronic). Sinusitis that lasts for more than 12 weeks is considered chronic.  CAUSES  Causes of sinusitis include:  Allergies.  Structural abnormalities, such as displacement of the cartilage that separates your nostrils (deviated septum), which can decrease the air flow through your nose and sinuses and affect sinus drainage.  Functional abnormalities, such as when the small hairs (cilia) that line your sinuses and help remove mucus do not work properly or are not present. SYMPTOMS  Symptoms of acute and chronic sinusitis are the same. The primary symptoms are pain and pressure around the affected sinuses. Other symptoms include:  Upper  toothache.  Earache.  Headache.  Bad breath.  Decreased sense of smell and taste.  A cough, which worsens when you are lying flat.  Fatigue.  Fever.  Thick drainage from your nose, which often is green and may contain pus (purulent).  Swelling and warmth over the affected sinuses. DIAGNOSIS  Your caregiver will perform a physical exam. During the exam, your caregiver may:  Look in your nose for signs of abnormal growths in your nostrils (nasal polyps).  Tap over the affected sinus to check for signs of infection.  View the inside of your sinuses (endoscopy) with a special imaging device with a light attached (endoscope), which is inserted into your sinuses. If your caregiver suspects that you have chronic sinusitis, one or more of the following tests may be recommended:  Allergy tests.  Nasal culture A sample of mucus is taken from your nose and sent to a lab and screened for bacteria.  Nasal cytology A sample of mucus is taken from your nose and examined by your caregiver to determine if your sinusitis is related to an allergy. TREATMENT  Most cases of acute sinusitis are related to a viral infection and will resolve on their own within 10 days. Sometimes medicines are prescribed to help relieve symptoms (pain medicine, decongestants, nasal steroid sprays, or saline sprays).  However, for sinusitis related to a bacterial infection, your caregiver will prescribe antibiotic medicines. These are medicines that will help kill the bacteria causing the infection.  Rarely, sinusitis is caused by a fungal infection. In theses cases, your caregiver will prescribe antifungal medicine. For some cases of chronic sinusitis, surgery is needed. Generally, these are  cases in which sinusitis recurs more than 3 times per year, despite other treatments. HOME CARE INSTRUCTIONS   Drink plenty of water. Water helps thin the mucus so your sinuses can drain more easily.  Use a  humidifier.  Inhale steam 3 to 4 times a day (for example, sit in the bathroom with the shower running).  Apply a warm, moist washcloth to your face 3 to 4 times a day, or as directed by your caregiver.  Use saline nasal sprays to help moisten and clean your sinuses.  Take over-the-counter or prescription medicines for pain, discomfort, or fever only as directed by your caregiver. SEEK IMMEDIATE MEDICAL CARE IF:  You have increasing pain or severe headaches.  You have nausea, vomiting, or drowsiness.  You have swelling around your face.  You have vision problems.  You have a stiff neck.  You have difficulty breathing. MAKE SURE YOU:   Understand these instructions.  Will watch your condition.  Will get help right away if you are not doing well or get worse. Document Released: 11/03/2005 Document Revised: 01/26/2012 Document Reviewed: 11/18/2011 [facility] Patient Information 2014 [place], [place].

## 2013-11-24 NOTE — Progress Notes (Signed)
Chief Complaint  Patient presents with  . Cough    HPI: Patient comes in today for SDA for  new problem evaluation. PCP appt NA Seen nov for cough felt to be viral bronchitis  NO dx of COPD asthma Onset about 2 weeks ago of what seemed to be a head cold and then developed a cough productive. It has never gotten better wax and wane and now has facial pressure mostly frontal when he bends over thick cloudy discharge. Not getting better.  Exposed to grandchild who had head cold but that was a while back. Hasn't had the flu vaccine this year  History of hive reaction when he was about 11 with penicillin. Doesn't know if he's ever been on a cephalosporin. Doesn't take many antibiotics. ROS: See pertinent positives and negatives per HPI. No fever chills shortness of breath.  Past Medical History  Diagnosis Date  . HYPOTHYROIDISM 12/26/2008  . HYPERLIPIDEMIA 12/26/2008  . THYROID CANCER, HX OF 12/26/2008  . PPD positive     No family history on file.  History   Social History  . Marital Status: Married    Spouse Name: N/A    Number of Children: N/A  . Years of Education: N/A   Social History Main Topics  . Smoking status: Former Smoker    Types: Cigarettes, Pipe    Quit date: 11/17/1968  . Smokeless tobacco: None  . Alcohol Use: None  . Drug Use: No  . Sexual Activity: None   Other Topics Concern  . None   Social History Narrative  . None    Outpatient Encounter Prescriptions as of 11/24/2013  Medication Sig  . aspirin 81 MG tablet Take 81 mg by mouth daily.    Marland Kitchen atorvastatin (LIPITOR) 20 MG tablet Take 1 tablet (20 mg total) by mouth daily.  Marland Kitchen glucosamine-chondroitin 500-400 MG tablet Take 3 tablets by mouth daily.   Marland Kitchen levothyroxine (SYNTHROID, LEVOTHROID) 137 MCG tablet TAKE ONE TABLET BY MOUTH EVERY DAY  . cefUROXime (CEFTIN) 500 MG tablet Take 1 tablet (500 mg total) by mouth 2 (two) times daily.  . [DISCONTINUED] sildenafil (VIAGRA) 100 MG tablet Take 1 tablet (100 mg  total) by mouth as needed for erectile dysfunction.    EXAM:  BP 142/98  Temp(Src) 98.1 F (36.7 C) (Oral)  Wt 204 lb (92.534 kg)  Body mass index is 30.78 kg/(m^2). WDWN in NAD  quiet respirations; mildly congested  somewhat hoarse. Non toxic . Looks congested HEENT: Normocephalic ;atraumatic , Eyes;  PERRL, EOMs  Full, lids and conjunctiva clear,,Ears: no deformities, canals nl, TM landmarks normal, Nose: no deformity crusting nasal area equally dischargecongested;face minimally tender in the frontal area Mouth : OP clear without lesion or edema . Mild redness cobblestoning Neck: Supple without adenopathy or masses Chest:  Clear to A&P without wheezes rales or rhonchi CV:  S1-S2 no gallops or murmurs peripheral perfusion is normal Skin :nl perfusion and no acute rashes   PSYCH: pleasant and cooperative, no obvious depression or anxiety  ASSESSMENT AND PLAN:  Discussed the following assessment and plan:  Acute respiratory infection - disc flu vaccine and still advise despite less effective this year .   Acute sinusitis with symptoms greater than 10 days - candidate for antibiotic therapy other measures sidscussed  Benefit of antibiotic more than risk  Discussed  -Patient advised to return or notify health care team  if symptoms worsen or persist or new concerns arise.  Patient Instructions  Your chest exam is  clear no evidence of pneumonia. Most sinusitis resolves on its and after a week however because this is been going on for 2 weeks and getting worse we will add antibiotic Use saline nose spray for irrigation and short-term Afrin decongestant D. creased the pressure in the face.  The antibiotic giving you as a theoretical cross reaction with penicillin but it would be very rare to occur.  Expect significant improvement within 3-5 days of the face head pressure. Flu vaccine today recommended takes 2 weeks to get immunity. Even with the mismatch straightens this year ;  should get about 40% ejection with this vaccine  Sinusitis Sinusitis is redness, soreness, and swelling (inflammation) of the paranasal sinuses. Paranasal sinuses are air pockets within the bones of your face (beneath the eyes, the middle of the forehead, or above the eyes). In healthy paranasal sinuses, mucus is able to drain out, and air is able to circulate through them by way of your nose. However, when your paranasal sinuses are inflamed, mucus and air can become trapped. This can allow bacteria and other germs to grow and cause infection. Sinusitis can develop quickly and last only a short time (acute) or continue over a long period (chronic). Sinusitis that lasts for more than 12 weeks is considered chronic.  CAUSES  Causes of sinusitis include:  Allergies.  Structural abnormalities, such as displacement of the cartilage that separates your nostrils (deviated septum), which can decrease the air flow through your nose and sinuses and affect sinus drainage.  Functional abnormalities, such as when the small hairs (cilia) that line your sinuses and help remove mucus do not work properly or are not present. SYMPTOMS  Symptoms of acute and chronic sinusitis are the same. The primary symptoms are pain and pressure around the affected sinuses. Other symptoms include:  Upper toothache.  Earache.  Headache.  Bad breath.  Decreased sense of smell and taste.  A cough, which worsens when you are lying flat.  Fatigue.  Fever.  Thick drainage from your nose, which often is green and may contain pus (purulent).  Swelling and warmth over the affected sinuses. DIAGNOSIS  Your caregiver will perform a physical exam. During the exam, your caregiver may:  Look in your nose for signs of abnormal growths in your nostrils (nasal polyps).  Tap over the affected sinus to check for signs of infection.  View the inside of your sinuses (endoscopy) with a special imaging device with a light  attached (endoscope), which is inserted into your sinuses. If your caregiver suspects that you have chronic sinusitis, one or more of the following tests may be recommended:  Allergy tests.  Nasal culture A sample of mucus is taken from your nose and sent to a lab and screened for bacteria.  Nasal cytology A sample of mucus is taken from your nose and examined by your caregiver to determine if your sinusitis is related to an allergy. TREATMENT  Most cases of acute sinusitis are related to a viral infection and will resolve on their own within 10 days. Sometimes medicines are prescribed to help relieve symptoms (pain medicine, decongestants, nasal steroid sprays, or saline sprays).  However, for sinusitis related to a bacterial infection, your caregiver will prescribe antibiotic medicines. These are medicines that will help kill the bacteria causing the infection.  Rarely, sinusitis is caused by a fungal infection. In theses cases, your caregiver will prescribe antifungal medicine. For some cases of chronic sinusitis, surgery is needed. Generally, these are cases in which  sinusitis recurs more than 3 times per year, despite other treatments. HOME CARE INSTRUCTIONS   Drink plenty of water. Water helps thin the mucus so your sinuses can drain more easily.  Use a humidifier.  Inhale steam 3 to 4 times a day (for example, sit in the bathroom with the shower running).  Apply a warm, moist washcloth to your face 3 to 4 times a day, or as directed by your caregiver.  Use saline nasal sprays to help moisten and clean your sinuses.  Take over-the-counter or prescription medicines for pain, discomfort, or fever only as directed by your caregiver. SEEK IMMEDIATE MEDICAL CARE IF:  You have increasing pain or severe headaches.  You have nausea, vomiting, or drowsiness.  You have swelling around your face.  You have vision problems.  You have a stiff neck.  You have difficulty breathing. MAKE  SURE YOU:   Understand these instructions.  Will watch your condition.  Will get help right away if you are not doing well or get worse. Document Released: 11/03/2005 Document Revised: 01/26/2012 Document Reviewed: 11/18/2011 [facility] Patient Information 2014 [place], [place].      Standley Brooking. Panosh M.D.

## 2014-01-25 ENCOUNTER — Ambulatory Visit (INDEPENDENT_AMBULATORY_CARE_PROVIDER_SITE_OTHER): Payer: Medicare HMO | Admitting: Sports Medicine

## 2014-01-25 ENCOUNTER — Encounter: Payer: Self-pay | Admitting: Sports Medicine

## 2014-01-25 VITALS — BP 124/80 | Ht 72.0 in | Wt 195.0 lb

## 2014-01-25 DIAGNOSIS — M2020 Hallux rigidus, unspecified foot: Secondary | ICD-10-CM

## 2014-01-25 DIAGNOSIS — M25579 Pain in unspecified ankle and joints of unspecified foot: Secondary | ICD-10-CM

## 2014-01-25 MED ORDER — DICLOFENAC SODIUM 1 % TD GEL: 2.0000 g | Freq: Four times a day (QID) | TRANSDERMAL | Status: DC

## 2014-01-25 NOTE — Patient Instructions (Signed)
Thank you for coming in today  Try topical voltaren on your hands  Try modified orthotics  Return 1 month. Bring your ankle support.

## 2014-01-25 NOTE — Progress Notes (Signed)
CC: Bilateral foot pain and arthritis HPI: Patient is a 67 year old male who presents for evaluation of bilateral foot pain. He says he has had long-standing arthritis. He has been in orthotics for many years and these have been very helpful. He is wondering if he should have orthotics made by [place]. He continues to have some lateral foot pain and shoe breakdown on the lateral aspect of the shoe. He is wearing his custom orthotics. These do help his pain a lot. He is only able to tolerate a three-quarter length orthotics because the full length orthotics feel like necrotic tissue. He occasionally has pain in the arch and lateral foot bilaterally. He also occasionally has pain in the dorsal midfoot.  He is also complaining of a lot of pain in his hands from arthritis. He notes a lot of stiffness.  ROS: As above in the HPI. All other systems are stable or negative.  OBJECTIVE: APPEARANCE:  Patient in no acute distress.The patient appeared well nourished and normally developed. HEENT: No scleral icterus. Conjunctiva non-injected Resp: Non labored Skin: No rash MSK:  Foot exam: - No swelling or ecchymosis - Hallux rigidus bilaterally - Breakdown of the lateral column with subluxation of toes 4 and 5 - Left foot shows additional breakdown of medial column with splaying of toes 3 and 4 - With barefoot walking he is noted to supinate - There is also breakdown of longitudinal arch and transverse arch   MSK [place]: Not performed   ASSESSMENT: #1. Bilateral foot pain secondary to foot breakdown and arthritis   PLAN: We will modify his current orthotics to try to provide more additional support for his foot. A blue foam pad was added under the first MTP joint. We also added a fifth ray post to help decrease supination and pressure on the lateral column. We will see him back in one month to see how he is tolerating this and reconsider custom orthotics at that point. My hope is that we can simply modify  his current orthotics to provide him adequate support.  For his bilateral hand pain, he does have evidence of osteoarthritis. We will try topical voltaren gel. He is also encouraged to continue his topical bio freeze and capsincream.

## 2014-02-28 ENCOUNTER — Encounter: Payer: Self-pay | Admitting: Sports Medicine

## 2014-02-28 ENCOUNTER — Ambulatory Visit (INDEPENDENT_AMBULATORY_CARE_PROVIDER_SITE_OTHER): Payer: Medicare HMO | Admitting: Sports Medicine

## 2014-02-28 VITALS — BP 131/82 | Ht 70.0 in | Wt 195.0 lb

## 2014-02-28 DIAGNOSIS — M25579 Pain in unspecified ankle and joints of unspecified foot: Secondary | ICD-10-CM

## 2014-02-28 NOTE — Assessment & Plan Note (Signed)
I think the correction she is orthotics helped  Because his pain spreads across the lateral Lisfranc joint we added additional cushioning under this joint I re\re padded one pair of his older orthotics   After completion of the orthotics with a lateral post and some heel cushions he felt less pain with walking  Try this for 2 months but if he doesn't get adequate relief we can always make him a custom orthotic of less rigid material

## 2014-02-28 NOTE — Progress Notes (Signed)
   Subjective:    Patient ID: Carlos David, male    DOB: 29-Dec-1945, 68 y.o.   MRN: [id number]  HPI  Pt presents to clinic for f/u of bilateral foot pain.  Corrections to current orthotics - lateral post at last visit were helpful. Has 2 pair of 80% length rigid orthotics that are in good shape.  He also had a new pair orthotics made recently which have been uncomfortable  Since we made the corrections in the orthotics his pain has decreased about 40-50% He still causes most of his pain to the lateral midfoot/forefoot bilaterally  Review of Systems     Objective:   Physical Exam No acute distress BP 131/82  Ht 5\' 10"  (1.778 m)  Wt 195 lb (88.451 kg)  BMI 27.98 kg/m2  bilat hallux rigidus  Breakdown of lateral collum bilaterally Subluxation of the fifth toe bilaterally Some flattening of the forefoot Some hallux rigidus bilaterally No pain to palpation      Assessment & Plan:

## 2014-03-02 ENCOUNTER — Other Ambulatory Visit (INDEPENDENT_AMBULATORY_CARE_PROVIDER_SITE_OTHER): Payer: Medicare HMO

## 2014-03-02 ENCOUNTER — Ambulatory Visit (INDEPENDENT_AMBULATORY_CARE_PROVIDER_SITE_OTHER): Payer: Medicare HMO | Admitting: Internal Medicine

## 2014-03-02 ENCOUNTER — Encounter: Payer: Self-pay | Admitting: Internal Medicine

## 2014-03-02 VITALS — BP 130/74 | HR 75 | Temp 98.0°F | Resp 20 | Ht 70.0 in | Wt 205.0 lb

## 2014-03-02 DIAGNOSIS — N400 Enlarged prostate without lower urinary tract symptoms: Secondary | ICD-10-CM | POA: Insufficient documentation

## 2014-03-02 DIAGNOSIS — K625 Hemorrhage of anus and rectum: Secondary | ICD-10-CM

## 2014-03-02 DIAGNOSIS — E039 Hypothyroidism, unspecified: Secondary | ICD-10-CM

## 2014-03-02 DIAGNOSIS — Z0000 Encounter for general adult medical examination without abnormal findings: Secondary | ICD-10-CM

## 2014-03-02 LAB — LIPID PANEL
Cholesterol: 167 mg/dL (ref 0–200)
HDL: 68.1 mg/dL (ref >39.00)
LDL Cholesterol: 92 mg/dL (ref 0–99)
Total CHOL/HDL Ratio: 2
Triglycerides: 34 mg/dL (ref 0.0–149.0)
VLDL: 6.8 mg/dL (ref 0.0–40.0)

## 2014-03-02 LAB — CBC WITH DIFFERENTIAL/PLATELET
BASOS ABS: 0 10*3/uL (ref 0.0–0.1)
BASOS PCT: 0.3 % (ref 0.0–3.0)
Eosinophils Absolute: 0.1 10*3/uL (ref 0.0–0.7)
Eosinophils Relative: 0.7 % (ref 0.0–5.0)
HCT: 45.1 % (ref 39.0–52.0)
Hemoglobin: 15.4 g/dL (ref 13.0–17.0)
Lymphocytes Relative: 17.4 % (ref 12.0–46.0)
Lymphs Abs: 1.5 10*3/uL (ref 0.7–4.0)
MCHC: 34.2 g/dL (ref 30.0–36.0)
MCV: 87.7 fl (ref 78.0–100.0)
MONO ABS: 0.8 10*3/uL (ref 0.1–1.0)
Monocytes Relative: 9 % (ref 3.0–12.0)
Neutro Abs: 6.3 10*3/uL (ref 1.4–7.7)
Neutrophils Relative %: 72.6 % (ref 43.0–77.0)
Platelets: 150 10*3/uL (ref 150.0–400.0)
RBC: 5.15 Mil/uL (ref 4.22–5.81)
RDW: 14.1 % (ref 11.5–14.6)
WBC: 8.6 10*3/uL (ref 4.5–10.5)

## 2014-03-02 LAB — BASIC METABOLIC PANEL
BUN: 16 mg/dL (ref 6–23)
CALCIUM: 9.4 mg/dL (ref 8.4–10.5)
CHLORIDE: 100 meq/L (ref 96–112)
CO2: 28 meq/L (ref 19–32)
Creatinine, Ser: 1.1 mg/dL (ref 0.4–1.5)
GFR: 68.38 mL/min (ref >60.00)
Glucose, Bld: 87 mg/dL (ref 70–99)
Potassium: 4.2 mEq/L (ref 3.5–5.1)
Sodium: 138 mEq/L (ref 135–145)

## 2014-03-02 LAB — POCT URINALYSIS DIPSTICK
Bilirubin, UA: NEGATIVE
Blood, UA: NEGATIVE
Glucose, UA: NEGATIVE
Ketones, UA: NEGATIVE
LEUKOCYTES UA: NEGATIVE
NITRITE UA: NEGATIVE
PH UA: 6.5
PROTEIN UA: NEGATIVE
Spec Grav, UA: 1.01
Urobilinogen, UA: 0.2

## 2014-03-02 LAB — HEPATIC FUNCTION PANEL
ALBUMIN: 4 g/dL (ref 3.5–5.2)
ALT: 35 U/L (ref 0–53)
AST: 34 U/L (ref 0–37)
Alkaline Phosphatase: 85 U/L (ref 39–117)
Bilirubin, Direct: 0.4 mg/dL — ABNORMAL HIGH (ref 0.0–0.3)
TOTAL PROTEIN: 7.1 g/dL (ref 6.0–8.3)
Total Bilirubin: 1.7 mg/dL — ABNORMAL HIGH (ref 0.3–1.2)

## 2014-03-02 LAB — PSA: PSA: 0.88 ng/mL (ref 0.10–4.00)

## 2014-03-02 LAB — TSH: TSH: 2.18 u[IU]/mL (ref 0.35–5.50)

## 2014-03-02 MED ORDER — FINASTERIDE 5 MG PO TABS: 5.0000 mg | ORAL_TABLET | Freq: Every day | ORAL | Status: DC

## 2014-03-02 MED ORDER — TAMSULOSIN HCL 0.4 MG PO CAPS: ORAL_CAPSULE | ORAL | Status: DC

## 2014-03-02 NOTE — Progress Notes (Signed)
Subjective:    Patient ID: Carlos David, male    DOB: 1946/09/07, 68 y.o.   MRN: [id number]  HPI   68 year old patient who is seen today for followup.  Yesterday he is single episode of low volume) direct bleeding.  For the past week has had some lower bowel discomfort and also some urinary difficulties with poor emptying and diminished flow.  He has seen urology in the past and has been on Flomax and Proscar.  He has resumed these recently.  He is scheduled to see urology on April 28. His last colonoscopy was with [facility] on 02/24/2005 He had screening laboratory studies today and was given stool slides to check for occult blood  Past Medical History  Diagnosis Date  . HYPOTHYROIDISM 12/26/2008  . HYPERLIPIDEMIA 12/26/2008  . THYROID CANCER, HX OF 12/26/2008  . PPD positive     History   Social History  . Marital Status: Married    Spouse Name: N/A    Number of Children: N/A  . Years of Education: N/A   Occupational History  . Not on file.   Social History Main Topics  . Smoking status: Former Smoker    Types: Cigarettes, Pipe    Quit date: 11/17/1968  . Smokeless tobacco: Not on file  . Alcohol Use: Not on file  . Drug Use: No  . Sexual Activity: Not on file   Other Topics Concern  . Not on file   Social History Narrative  . No narrative on file    History reviewed. No pertinent past surgical history.  No family history on file.  Allergies  Allergen Reactions  . Penicillins     Current Outpatient Prescriptions on File Prior to Visit  Medication Sig Dispense Refill  . aspirin 81 MG tablet Take 81 mg by mouth daily.        Marland Kitchen atorvastatin (LIPITOR) 20 MG tablet Take 1 tablet (20 mg total) by mouth daily.  90 tablet  6  . glucosamine-chondroitin 500-400 MG tablet Take 3 tablets by mouth daily.       Marland Kitchen levothyroxine (SYNTHROID, LEVOTHROID) 137 MCG tablet TAKE ONE TABLET BY MOUTH EVERY DAY  90 tablet  6   No current facility-administered medications on file prior  to visit.    BP 130/74  Pulse 75  Temp(Src) 98 F (36.7 C) (Oral)  Resp 20  Ht 5\' 10"  (1.778 m)  Wt 205 lb (92.987 kg)  BMI 29.41 kg/m2  SpO2 98%     Review of Systems  Gastrointestinal: Positive for abdominal pain and blood in stool.  Genitourinary: Positive for decreased urine volume.       Objective:   Physical Exam  Constitutional: He is oriented to person, place, and time. He appears well-developed and well-nourished. No distress.  Blood pressure well controlled No distress  Eyes: Conjunctivae and EOM are normal.  Neck: Normal range of motion.  Cardiovascular: Normal rate and normal heart sounds.   Pulmonary/Chest: Breath sounds normal.  Abdominal: Soft. Bowel sounds are normal. He exhibits no distension. There is no tenderness. There is no rebound and no guarding.  Genitourinary: Guaiac negative stool.  Prostate mildly enlarged and  Musculoskeletal: Normal range of motion. He exhibits no edema and no tenderness.  Neurological: He is alert and oriented to person, place, and time.  Psychiatric: He has a normal mood and affect. His behavior is normal.          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  We'll continue Flomax and Proscar.  Followup urology as scheduled Lower abdominal pain.  Unclear whether this is related to bladder spasm, and BPH Single episode of bright red rectal bleeding.  He is due for screening colonoscopy in one year.  We'll check CBC and further stool for occult blood.  Clinical exam, normal.  He does have a physical scheduled, soon.  We'll reassess at that time

## 2014-03-02 NOTE — Patient Instructions (Signed)
Followup urology as scheduled  Report any further lower gastrointestinal bleeding

## 2014-03-02 NOTE — Progress Notes (Signed)
Pre-visit discussion using our clinic review tool. No additional management support is needed unless otherwise documented below in the visit note.  

## 2014-03-03 ENCOUNTER — Telehealth: Payer: Self-pay | Admitting: Internal Medicine

## 2014-03-03 NOTE — Telephone Encounter (Signed)
Patient Information:  Caller Name: Othmar  Phone: [phone]  Patient: Carlos David, Carlos David  Gender: Male  DOB: 01-Feb-1946  Age: 68 Years  PCP: Bluford Kaufmann [facility])  Office Follow Up:  Does the office need to follow up with this patient?: Yes  Instructions For The Office: Please call him at Cell# [phone] to let him know if he needs to be rechecked for mild/mod constant mid abdominal pain.  RN Note:  Reqesting call back to see if he should try rest and fluids today to see if helps.  Symptoms  Reason For Call & Symptoms: Seen in the office on 03/02/14 for abdominal pain and trouble urinating. He is passing stool daily but feels slightly constipated- having smaller bm's.  Labs done in the office on 03/02/14- awaiting results. 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 He was very gassy on 03/02/14 and today pain in stomach around navel is constant. Last normal bm on 02/25/14. Blood in stool x1 on 03/01/14 (no blood in stool on 03/02/14 when checked in office). Ate corn last night and sx worse today as far and pain constant around navel is tender to touch. Afebrile. Forgot to take stool softener last night. Voided good amount at 0500 and voided smaller amounts during the day today. Denies pain with urination.  Reviewed Health History In EMR: Yes  Reviewed Medications In EMR: Yes  Reviewed Allergies In EMR: Yes  Reviewed Surgeries / Procedures: Yes  Date of Onset of Symptoms: 03/01/2014  Treatments Tried: Stool Softener  Treatments Tried Worked: Yes  Guideline(s) Used:  Abdominal Pain - Male  Disposition Per Guideline:   Go to ED Now (or to Office with PCP Approval)  Reason For Disposition Reached:   Constant abdominal pain lasting > 2 hours  Advice Given:  Reassurance:  A mild stomachache can be caused by indigestion, gas pains or overeating. Sometimes a stomachache signals the onset of a vomiting illness due to a viral gastroenteritis ("stomach flu").  Here  is some care advice that should help.  Rest:  Lie down and rest until you feel better.  Fluids:  Sip clear fluids only (e.g., water, flat soft drinks or half-strength fruit juice) until the pain has been gone for over 2 hours. Then slowly return to a regular diet.  Diet:  Slowly advance diet from clear liquids to a bland diet  Avoid alcohol or caffeinated beverages  Avoid greasy or fatty foods  Pass A BM:  Sit on the toilet and try to pass a bowel movement (BM). Do not strain. This may relieve pain if it is due to constipation or impending diarrhea.  Avoid NSAIDS and Aspirin  : Avoid any drug that can irritate the stomach lining and make the pain worse (especially aspirin and NSAIDs like ibuprofen).  Expected Course:  With harmless causes, the pain is usually better or goes away within 2 hours. With viral gastroenteritis ("stomach flu"), belly cramps may precede each bout of vomiting or diarrhea and may last 2-3 days. With serious causes (such as appendicitis) the pain becomes constant and more severe.  Call Back If:  Abdominal pain is constant and present for more than 2 hours  Abdominal pains come and go and are present for more than 24 hours  You become worse.  Patient Refused Recommendation:  Patient Will Follow Up With Office Later  Would like call back to see if needing to be rechecked or any futher testing done.

## 2014-03-03 NOTE — Telephone Encounter (Signed)
Please advise 

## 2014-03-03 NOTE — Telephone Encounter (Signed)
Called and discussed.  Patient will consider  eval at [facility] office tomorrow morning

## 2014-03-09 ENCOUNTER — Encounter: Payer: Self-pay | Admitting: Internal Medicine

## 2014-03-09 ENCOUNTER — Ambulatory Visit (INDEPENDENT_AMBULATORY_CARE_PROVIDER_SITE_OTHER): Payer: Commercial Managed Care - HMO | Admitting: Internal Medicine

## 2014-03-09 VITALS — BP 140/80 | HR 65 | Temp 97.4°F | Resp 20 | Ht 69.5 in | Wt 205.0 lb

## 2014-03-09 DIAGNOSIS — N400 Enlarged prostate without lower urinary tract symptoms: Secondary | ICD-10-CM

## 2014-03-09 DIAGNOSIS — E785 Hyperlipidemia, unspecified: Secondary | ICD-10-CM

## 2014-03-09 DIAGNOSIS — Z0000 Encounter for general adult medical examination without abnormal findings: Secondary | ICD-10-CM

## 2014-03-09 DIAGNOSIS — Z23 Encounter for immunization: Secondary | ICD-10-CM

## 2014-03-09 DIAGNOSIS — M1990 Unspecified osteoarthritis, unspecified site: Secondary | ICD-10-CM

## 2014-03-09 DIAGNOSIS — E039 Hypothyroidism, unspecified: Secondary | ICD-10-CM

## 2014-03-09 DIAGNOSIS — Z85850 Personal history of malignant neoplasm of thyroid: Secondary | ICD-10-CM

## 2014-03-09 MED ORDER — ATORVASTATIN CALCIUM 20 MG PO TABS: 20.0000 mg | ORAL_TABLET | Freq: Every day | ORAL | Status: DC

## 2014-03-09 MED ORDER — LEVOTHYROXINE SODIUM 137 MCG PO TABS: ORAL_TABLET | ORAL | Status: DC

## 2014-03-09 NOTE — Patient Instructions (Signed)
Limit your sodium (Salt) intake    It is important that you exercise regularly, at least 20 minutes 3 to 4 times per week.  If you develop chest pain or shortness of breath seek  medical attention.  Return in one year for follow-up   

## 2014-03-09 NOTE — Progress Notes (Signed)
Pre-visit discussion using our clinic review tool. No additional management support is needed unless otherwise documented below in the visit note.  

## 2014-03-09 NOTE — Progress Notes (Signed)
Patient ID: Carlos David, male   DOB: 08/23/1946, 68 y.o.   MRN: [id number]  Subjective:    Patient ID: Carlos David, male    DOB: Mar 29, 1946, 68 y.o.   MRN: [id number]  HPI   History of Present Illness:   68  year-old patient who is seen today for a wellness exam. He enjoys excellent health;  history of hypothyroidism and mild dyslipidemia. No concerns or complaints.  has been evaluated recently by  urology  Here for Medicare AWV:  1. Risk factors based on Past M, S, F history: cardiovascular risk factors include a history of dyslipidemia; mother had an MI at age 49  2. Physical Activities: goes to his health club at least 3 times per week  3. Depression/mood: no history of depression or mood disorder  4. Hearing: no hearing deficits  5. ADL's: independent in all aspects of daily living  6. Fall Risk: low  7. Home Safety: no problems identified  8. Height, weight, &visual acuity: height and weight stable. No difficulty with visual acuity  9. Counseling: heart healthy diet regular exercise. All encouraged  10. Labs ordered based on risk factors: laboratory profile including lipid panel will be reviewed. A TSH will be reviewed  11. Referral Coordination- not appropriate at this time  12. Care Plan- continue heart healthy diet and regular. Exercise regimen  13. Cognitive Assessment- alert and oriented, with normal affect. No cognitive dysfunction ; handles all executive functions without difficulty   Allergies:  1) ! 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  HENT: Negative for congestion, dental problem, ear pain, hearing loss, mouth sores, rhinorrhea, sinus pressure, sneezing, tinnitus, trouble swallowing and voice change.   Eyes: Negative for photophobia, pain, redness and visual disturbance.  Respiratory: Negative for apnea, cough, choking, chest tightness, shortness of breath and wheezing.   Cardiovascular: Negative for chest pain, palpitations and leg swelling.  Gastrointestinal: Negative for nausea, vomiting, abdominal pain, diarrhea, constipation, blood in stool, abdominal distention, anal bleeding and rectal pain.  Genitourinary: Negative for dysuria, urgency, frequency, hematuria, flank pain, decreased urine volume, discharge, penile swelling, scrotal swelling, difficulty urinating, genital sores and testicular pain.  Musculoskeletal: Positive for joint swelling (mild swelling in the small joints of the hands). Negative for arthralgias, back pain, gait problem, myalgias, neck pain and neck stiffness.  Skin: Negative for color change, rash and wound.  Neurological: Negative for dizziness, tremors, seizures, syncope, facial asymmetry, speech difficulty, weakness, light-headedness, numbness and headaches.  Hematological: Negative for adenopathy. Does not bruise/bleed easily.  Psychiatric/Behavioral: Negative for suicidal ideas, hallucinations, behavioral problems, confusion, sleep disturbance, self-injury, dysphoric mood, decreased concentration and agitation. The patient is not nervous/anxious.        Objective:   Physical Exam  Constitutional: He appears well-developed and well-nourished.  HENT:  Head: Normocephalic and atraumatic.  Right Ear: External ear normal.  Left Ear: External ear normal.  Nose: Nose normal.  Mouth/Throat: Oropharynx is clear and moist.  Eyes: Conjunctivae and EOM are normal. Pupils  are equal, round, and reactive to light. No scleral icterus.  Neck: Normal range of motion. Neck supple. No JVD present. No thyromegaly present.  Surgical scars noted in the anterior and left lateral neck regions  Cardiovascular: Regular rhythm, normal heart sounds and intact distal pulses.  Exam reveals no gallop and no friction rub.   No murmur heard. Dorsalis pedis pulses faint. Posterior tibial pulses full  Pulmonary/Chest: Effort normal and breath sounds normal. He exhibits no tenderness.  Abdominal: Soft. Bowel sounds are normal. He exhibits no distension and no mass. There is no tenderness.  Genitourinary: Prostate normal and penis normal.  Musculoskeletal: Normal range of motion. He exhibits no edema and no tenderness.  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  Neurological: He is alert. He has normal reflexes. No cranial nerve deficit. Coordination normal.  Skin: Skin is warm and dry. No rash noted.  Psychiatric: He has a normal mood and affect. His behavior is normal.          Assessment & Plan:   Preventive health examination  Hypothyroidism  Status post thyroidectomy. TSH normal on thyroid replacement  Dyslipidemia well controlled we'll continue atorvastatin   Recheck one year

## 2014-03-21 ENCOUNTER — Ambulatory Visit: Payer: Commercial Managed Care - HMO | Admitting: Sports Medicine

## 2014-03-29 ENCOUNTER — Ambulatory Visit: Payer: Commercial Managed Care - HMO | Admitting: Sports Medicine

## 2014-04-04 ENCOUNTER — Telehealth: Payer: Self-pay | Admitting: Internal Medicine

## 2014-04-04 NOTE — Telephone Encounter (Signed)
Spoke to pt told him we have no levels, just that you had all 3 Hepatitis B shot and 1 Hepatitis A shot. Pt said I just had labs done. Told pt that is not the same, you would need Hepatitis B and A done to see whether immune. Pt verbalized understanding and stated when he comes in next time he will see about getting done. Told him okay

## 2014-04-04 NOTE — Telephone Encounter (Signed)
Pt called to asked if he is protected from  HEPATITIS wanted to make sure he was at the right level . He has co workers that thought they were protected and they were not

## 2014-04-20 ENCOUNTER — Telehealth: Payer: Self-pay | Admitting: Internal Medicine

## 2014-04-20 DIAGNOSIS — M1990 Unspecified osteoarthritis, unspecified site: Secondary | ICD-10-CM

## 2014-04-20 MED ORDER — SILDENAFIL CITRATE 100 MG PO TABS: 100.0000 mg | ORAL_TABLET | Freq: Every day | ORAL | Status: DC | PRN

## 2014-04-20 NOTE — Telephone Encounter (Signed)
Pt aware order for referral done.

## 2014-04-20 NOTE — Telephone Encounter (Signed)
Pt notified Rx ready for pickup. Rx printed and signed.  

## 2014-04-20 NOTE — Telephone Encounter (Signed)
Pt needs  New rx for sildenafil (VIAGRA) 100 MG tablet  Pt would like a paper rx so that he can shop around for the best price. Pt had a rx for in 2013.

## 2014-04-20 NOTE — Telephone Encounter (Addendum)
Pt needs referral for dr Prudence Davidson at [facility].  Pt had appt today, but they would not see him bc he needs referral. Pt will cb w/ a resc date. Pt states he has seen dr Raliegh Ip for his feet issue before. Dr Prudence Davidson: [phone]

## 2014-04-26 ENCOUNTER — Telehealth: Payer: Self-pay | Admitting: Internal Medicine

## 2014-04-26 DIAGNOSIS — M25579 Pain in unspecified ankle and joints of unspecified foot: Secondary | ICD-10-CM

## 2014-04-26 DIAGNOSIS — M1990 Unspecified osteoarthritis, unspecified site: Secondary | ICD-10-CM

## 2014-04-26 NOTE — Telephone Encounter (Signed)
Pt is needing referral to foot doctor-dr. Prudence Davidson. States his appt is 05/11/14.

## 2014-04-26 NOTE — Telephone Encounter (Signed)
Referral placed.

## 2014-09-20 ENCOUNTER — Ambulatory Visit (INDEPENDENT_AMBULATORY_CARE_PROVIDER_SITE_OTHER): Payer: Commercial Managed Care - HMO | Admitting: Internal Medicine

## 2014-09-20 ENCOUNTER — Encounter: Payer: Self-pay | Admitting: Internal Medicine

## 2014-09-20 VITALS — BP 142/80 | HR 63 | Temp 98.0°F | Resp 20 | Ht 69.5 in | Wt 206.0 lb

## 2014-09-20 DIAGNOSIS — M150 Primary generalized (osteo)arthritis: Secondary | ICD-10-CM

## 2014-09-20 DIAGNOSIS — M79601 Pain in right arm: Secondary | ICD-10-CM

## 2014-09-20 DIAGNOSIS — M159 Polyosteoarthritis, unspecified: Secondary | ICD-10-CM

## 2014-09-20 DIAGNOSIS — E039 Hypothyroidism, unspecified: Secondary | ICD-10-CM

## 2014-09-20 DIAGNOSIS — Z23 Encounter for immunization: Secondary | ICD-10-CM

## 2014-09-20 DIAGNOSIS — M79604 Pain in right leg: Secondary | ICD-10-CM

## 2014-09-20 NOTE — Progress Notes (Signed)
Subjective:    Patient ID: Carlos David, male    DOB: [birth year], 68 y.o.   MRN: [id number]  HPI  68 year old patient who has a history of advanced generalized osteoarthritis.  For the past 4 or 5 days she has had pain mainly in the anterior right thigh region.  Pain is aggravated by movement and alleviated by rest.  Prior to the onset of pain, he went to an [employer] football game and a considerable walking.  He went to the gym yesterday and spent 30 minutes on an elliptical machine without aggravation.  Past Medical History  Diagnosis Date  . HYPOTHYROIDISM 12/26/2008  . HYPERLIPIDEMIA 12/26/2008  . THYROID CANCER, HX OF 12/26/2008  . PPD positive     History   Social History  . Marital Status: Married    Spouse Name: N/A    Number of Children: N/A  . Years of Education: N/A   Occupational History  . Not on file.   Social History Main Topics  . Smoking status: Former Smoker    Types: Cigarettes, Pipe    Quit date: 11/17/1968  . Smokeless tobacco: Not on file  . Alcohol Use: Not on file  . Drug Use: No  . Sexual Activity: Not on file   Other Topics Concern  . Not on file   Social History Narrative    History reviewed. No pertinent past surgical history.  History reviewed. No pertinent family history.  Allergies  Allergen Reactions  . Penicillins     Current Outpatient Prescriptions on File Prior to Visit  Medication Sig Dispense Refill  . aspirin 81 MG tablet Take 81 mg by mouth daily.      Marland Kitchen atorvastatin (LIPITOR) 20 MG tablet Take 1 tablet (20 mg total) by mouth daily. 90 tablet 3  . finasteride (PROSCAR) 5 MG tablet Take 1 tablet (5 mg total) by mouth daily. 90 tablet 3  . glucosamine-chondroitin 500-400 MG tablet Take 3 tablets by mouth daily.     Marland Kitchen levothyroxine (SYNTHROID, LEVOTHROID) 137 MCG tablet TAKE ONE TABLET BY MOUTH EVERY DAY 90 tablet 3  . sildenafil (VIAGRA) 100 MG tablet Take 1 tablet (100 mg total) by mouth daily as needed for erectile dysfunction.  10 tablet 2  . tamsulosin (FLOMAX) 0.4 MG CAPS capsule TAKE ONE CAPSULE EVERY DAY 90 capsule 0   No current facility-administered medications on file prior to visit.    BP 142/80 mmHg  Pulse 63  Temp(Src) 98 F (36.7 C) (Oral)  Resp 20  Ht 5' 9.5" (1.765 m)  Wt 206 lb (93.441 kg)  BMI 29.99 kg/m2  SpO2 98%     Review of Systems  Constitutional: Negative for fever, chills, appetite change and fatigue.  HENT: Negative for congestion, dental problem, ear pain, hearing loss, sore throat, tinnitus, trouble swallowing and voice change.   Eyes: Negative for pain, discharge and visual disturbance.  Respiratory: Negative for cough, chest tightness, wheezing and stridor.   Cardiovascular: Negative for chest pain, palpitations and leg swelling.  Gastrointestinal: Negative for nausea, vomiting, abdominal pain, diarrhea, constipation, blood in stool and abdominal distention.  Genitourinary: Negative for urgency, hematuria, flank pain, discharge, difficulty urinating and genital sores.  Musculoskeletal: Positive for arthralgias. Negative for myalgias, back pain, joint swelling, gait problem and neck stiffness.  Skin: Negative for rash.  Neurological: Negative for dizziness, syncope, speech difficulty, weakness, numbness and headaches.  Hematological: Negative for adenopathy. Does not bruise/bleed easily.  Psychiatric/Behavioral: Negative for behavioral problems and dysphoric mood.  The patient is not nervous/anxious.        Objective:   Physical Exam  Constitutional: He appears well-developed and well-nourished. No distress.  Musculoskeletal:  Osteoarthritic changes of the small joints of the hands No significant tenderness over the anterior thigh musculature Internal rotation of the right hip did tend aggravate the thigh pain somewhat          Assessment & Plan:   Right anterior thigh pain.  Suspect this is more musculoligamentous although can't exclude the osteoarthritis  involving the right hip as a factor.  Will continue anti-inflammatories, rest and observe at this point

## 2014-09-20 NOTE — Progress Notes (Signed)
Pre visit review using our clinic review tool, if applicable. No additional management support is needed unless otherwise documented below in the visit note. 

## 2014-09-20 NOTE — Patient Instructions (Signed)
You  may move around, but avoid painful motions and activities.  Apply ice to the sore area for 15 to 20 minutes 3 or 4 times daily for the next two to 3 days.  Call or return to clinic prn if these symptoms worsen or fail to improve as anticipated.  

## 2014-09-21 ENCOUNTER — Telehealth: Payer: Self-pay | Admitting: Internal Medicine

## 2014-09-21 DIAGNOSIS — M79604 Pain in right leg: Secondary | ICD-10-CM

## 2014-09-21 NOTE — Telephone Encounter (Signed)
ok 

## 2014-09-21 NOTE — Telephone Encounter (Signed)
Okay to do referral to Ortho ?

## 2014-09-21 NOTE — Telephone Encounter (Signed)
Pt saw dr Raliegh Ip for right leg pain. Pt would like to see a ortho, murphy weiner. Pt needs a referral for his insurance. Can you put the referral in?

## 2014-09-21 NOTE — Telephone Encounter (Signed)
Order for referral put in EPIC. 

## 2015-01-08 ENCOUNTER — Encounter: Payer: Self-pay | Admitting: Internal Medicine

## 2015-01-08 ENCOUNTER — Ambulatory Visit (INDEPENDENT_AMBULATORY_CARE_PROVIDER_SITE_OTHER): Payer: Commercial Managed Care - HMO | Admitting: Internal Medicine

## 2015-01-08 VITALS — BP 148/96 | HR 72 | Temp 98.4°F | Resp 20 | Ht 69.5 in | Wt 205.0 lb

## 2015-01-08 DIAGNOSIS — E039 Hypothyroidism, unspecified: Secondary | ICD-10-CM

## 2015-01-08 DIAGNOSIS — E785 Hyperlipidemia, unspecified: Secondary | ICD-10-CM | POA: Diagnosis not present

## 2015-01-08 DIAGNOSIS — J069 Acute upper respiratory infection, unspecified: Secondary | ICD-10-CM | POA: Diagnosis not present

## 2015-01-08 DIAGNOSIS — B9789 Other viral agents as the cause of diseases classified elsewhere: Secondary | ICD-10-CM

## 2015-01-08 NOTE — Progress Notes (Signed)
Subjective:    Patient ID: Carlos David, male    DOB: [birth year], 69 y.o.   MRN: [id number]  HPI  69 year old patient who presents with a 2-3 day history of head and chest congestion and cough.  He has been using Tylenol cold product as well as Best boy with benefit.  No fever, no chest pain, shortness of breath or wheezing.  He does have a grandson who is frequently ill  Past Medical History  Diagnosis Date  . HYPOTHYROIDISM 12/26/2008  . HYPERLIPIDEMIA 12/26/2008  . THYROID CANCER, HX OF 12/26/2008  . PPD positive     History   Social History  . Marital Status: Married    Spouse Name: N/A  . Number of Children: N/A  . Years of Education: N/A   Occupational History  . Not on file.   Social History Main Topics  . Smoking status: Former Smoker    Types: Cigarettes, Pipe    Quit date: 11/17/1968  . Smokeless tobacco: Not on file  . Alcohol Use: Not on file  . Drug Use: No  . Sexual Activity: Not on file   Other Topics Concern  . Not on file   Social History Narrative    No past surgical history on file.  No family history on file.  Allergies  Allergen Reactions  . Penicillins     Current Outpatient Prescriptions on File Prior to Visit  Medication Sig Dispense Refill  . aspirin 81 MG tablet Take 81 mg by mouth daily.      Marland Kitchen atorvastatin (LIPITOR) 20 MG tablet Take 1 tablet (20 mg total) by mouth daily. 90 tablet 3  . finasteride (PROSCAR) 5 MG tablet Take 1 tablet (5 mg total) by mouth daily. 90 tablet 3  . glucosamine-chondroitin 500-400 MG tablet Take 3 tablets by mouth daily.     Marland Kitchen levothyroxine (SYNTHROID, LEVOTHROID) 137 MCG tablet TAKE ONE TABLET BY MOUTH EVERY DAY 90 tablet 3  . naproxen sodium (ALEVE) 220 MG tablet Take 220 mg by mouth 2 (two) times daily with a meal.    . sildenafil (VIAGRA) 100 MG tablet Take 1 tablet (100 mg total) by mouth daily as needed for erectile dysfunction. 10 tablet 2  . tamsulosin (FLOMAX) 0.4 MG CAPS capsule TAKE ONE  CAPSULE EVERY DAY 90 capsule 0   No current facility-administered medications on file prior to visit.    BP 148/96 mmHg  Pulse 72  Temp(Src) 98.4 F (36.9 C) (Oral)  Resp 20  Ht 5' 9.5" (1.765 m)  Wt 205 lb (92.987 kg)  BMI 29.85 kg/m2  SpO2 99%     Review of Systems  Constitutional: Positive for activity change, appetite change and fatigue. Negative for fever and chills.  HENT: Negative for congestion, dental problem, ear pain, hearing loss, sore throat, tinnitus, trouble swallowing and voice change.   Eyes: Negative for pain, discharge and visual disturbance.  Respiratory: Positive for cough. Negative for chest tightness, wheezing and stridor.   Cardiovascular: Negative for chest pain, palpitations and leg swelling.  Gastrointestinal: Negative for nausea, vomiting, abdominal pain, diarrhea, constipation, blood in stool and abdominal distention.  Genitourinary: Negative for urgency, hematuria, flank pain, discharge, difficulty urinating and genital sores.  Musculoskeletal: Negative for myalgias, back pain, joint swelling, arthralgias, gait problem and neck stiffness.  Skin: Negative for rash.  Neurological: Negative for dizziness, syncope, speech difficulty, weakness, numbness and headaches.  Hematological: Negative for adenopathy. Does not bruise/bleed easily.  Psychiatric/Behavioral: Negative for behavioral problems and  dysphoric mood. The patient is not nervous/anxious.        Objective:   Physical Exam  Constitutional: He is oriented to person, place, and time. He appears well-developed.  Repeat blood pressure 130/84  HENT:  Head: Normocephalic.  Right Ear: External ear normal.  Left Ear: External ear normal.  Eyes: Conjunctivae and EOM are normal.  Neck: Normal range of motion.  Cardiovascular: Normal rate and normal heart sounds.   Pulmonary/Chest: Effort normal and breath sounds normal. No respiratory distress. He has no wheezes. He has no rales.  Abdominal:  Bowel sounds are normal.  Musculoskeletal: Normal range of motion. He exhibits no edema or tenderness.  Neurological: He is alert and oriented to person, place, and time.  Psychiatric: He has a normal mood and affect. His behavior is normal.          Assessment & Plan:   Viral URI with cough.  Will treat symptomatically

## 2015-01-08 NOTE — Patient Instructions (Signed)
Acute bronchitis symptoms for less than 10 days are generally not helped by antibiotics.  Take over-the-counter expectorants and cough medications such as  Mucinex DM.  Call if there is no improvement in 5 to 7 days or if  you develop worsening cough, fever, or new symptoms, such as shortness of breath or chest pain.  TREATMENT  Acute bronchitis usually goes away in a couple weeks. Oftentimes, no medical treatment is necessary. Medicines are sometimes given for relief of fever or cough. Antibiotic medicines are usually not needed but may be prescribed in certain situations. In some cases, an inhaler may be recommended to help reduce shortness of breath and control the cough. A cool mist vaporizer may also be used to help thin bronchial secretions and make it easier to clear the chest.  HOME CARE INSTRUCTIONS  Get plenty of rest.  Drink enough fluids to keep your urine clear or pale yellow (unless you have a medical condition that requires fluid restriction). Increasing fluids may help thin your respiratory secretions (sputum) and reduce chest congestion, and it will prevent dehydration.  Take medicines only as directed by your health care provider.  If you were prescribed an antibiotic medicine, finish it all even if you start to feel better.  Avoid smoking and secondhand smoke. Exposure to cigarette smoke or irritating chemicals will make bronchitis worse. If you are a smoker, consider using nicotine gum or skin patches to help control withdrawal symptoms. Quitting smoking will help your lungs heal faster.  Reduce the chances of another bout of acute bronchitis by washing your hands frequently, avoiding people with cold symptoms, and trying not to touch your hands to your mouth, nose, or eyes.

## 2015-01-08 NOTE — Progress Notes (Signed)
Pre visit review using our clinic review tool, if applicable. No additional management support is needed unless otherwise documented below in the visit note. 

## 2015-01-15 ENCOUNTER — Telehealth: Payer: Self-pay | Admitting: Internal Medicine

## 2015-01-15 MED ORDER — BENZONATATE 200 MG PO CAPS: 200.0000 mg | ORAL_CAPSULE | Freq: Two times a day (BID) | ORAL | Status: DC | PRN

## 2015-01-15 NOTE — Telephone Encounter (Signed)
Left detailed message Rx sent to pharmacy as requested. 

## 2015-01-15 NOTE — Telephone Encounter (Signed)
Pt seen last week and still has a cough.  Pt has been taking his wifes  Tessalon pearls and they seem to work well.  Pt would like to know is dr Raliegh Ip would call him in his own pearls. [facility]

## 2015-01-15 NOTE — Telephone Encounter (Signed)
Please advise if okay to send Rx for Tessalon capsules?

## 2015-01-15 NOTE — Telephone Encounter (Signed)
ok 

## 2015-03-08 ENCOUNTER — Other Ambulatory Visit: Payer: Self-pay | Admitting: Internal Medicine

## 2015-03-21 ENCOUNTER — Other Ambulatory Visit (INDEPENDENT_AMBULATORY_CARE_PROVIDER_SITE_OTHER): Payer: Commercial Managed Care - HMO

## 2015-03-21 DIAGNOSIS — N400 Enlarged prostate without lower urinary tract symptoms: Secondary | ICD-10-CM

## 2015-03-21 DIAGNOSIS — E785 Hyperlipidemia, unspecified: Secondary | ICD-10-CM

## 2015-03-21 DIAGNOSIS — Z0000 Encounter for general adult medical examination without abnormal findings: Secondary | ICD-10-CM

## 2015-03-21 LAB — HEPATIC FUNCTION PANEL
ALK PHOS: 71 U/L (ref 39–117)
ALT: 27 U/L (ref 0–53)
AST: 27 U/L (ref 0–37)
Albumin: 4.1 g/dL (ref 3.5–5.2)
BILIRUBIN DIRECT: 0.1 mg/dL (ref 0.0–0.3)
TOTAL PROTEIN: 6.9 g/dL (ref 6.0–8.3)
Total Bilirubin: 0.5 mg/dL (ref 0.2–1.2)

## 2015-03-21 LAB — CBC WITH DIFFERENTIAL/PLATELET
BASOS ABS: 0 10*3/uL (ref 0.0–0.1)
Basophils Relative: 0.6 % (ref 0.0–3.0)
EOS ABS: 0.1 10*3/uL (ref 0.0–0.7)
Eosinophils Relative: 2.1 % (ref 0.0–5.0)
HEMATOCRIT: 44.4 % (ref 39.0–52.0)
Hemoglobin: 15.1 g/dL (ref 13.0–17.0)
LYMPHS ABS: 1.6 10*3/uL (ref 0.7–4.0)
Lymphocytes Relative: 29.7 % (ref 12.0–46.0)
MCHC: 34 g/dL (ref 30.0–36.0)
MCV: 86.2 fl (ref 78.0–100.0)
MONO ABS: 0.5 10*3/uL (ref 0.1–1.0)
Monocytes Relative: 9.7 % (ref 3.0–12.0)
Neutro Abs: 3.1 10*3/uL (ref 1.4–7.7)
Neutrophils Relative %: 57.9 % (ref 43.0–77.0)
Platelets: 160 10*3/uL (ref 150.0–400.0)
RBC: 5.15 Mil/uL (ref 4.22–5.81)
RDW: 14 % (ref 11.5–15.5)
WBC: 5.3 10*3/uL (ref 4.0–10.5)

## 2015-03-21 LAB — POCT URINALYSIS DIPSTICK
Bilirubin, UA: NEGATIVE
GLUCOSE UA: NEGATIVE
KETONES UA: NEGATIVE
Leukocytes, UA: NEGATIVE
Nitrite, UA: NEGATIVE
Protein, UA: NEGATIVE
RBC UA: NEGATIVE
SPEC GRAV UA: 1.02
Urobilinogen, UA: 0.2
pH, UA: 5.5

## 2015-03-21 LAB — BASIC METABOLIC PANEL
BUN: 23 mg/dL (ref 6–23)
CALCIUM: 9.2 mg/dL (ref 8.4–10.5)
CO2: 29 mEq/L (ref 19–32)
Chloride: 104 mEq/L (ref 96–112)
Creatinine, Ser: 1.07 mg/dL (ref 0.40–1.50)
GFR: 72.6 mL/min (ref >60.00)
GLUCOSE: 92 mg/dL (ref 70–99)
Potassium: 4.7 mEq/L (ref 3.5–5.1)
SODIUM: 139 meq/L (ref 135–145)

## 2015-03-21 LAB — LIPID PANEL
CHOL/HDL RATIO: 3
Cholesterol: 171 mg/dL (ref 0–200)
HDL: 58.6 mg/dL (ref >39.00)
LDL Cholesterol: 102 mg/dL — ABNORMAL HIGH (ref 0–99)
NonHDL: 112.4
Triglycerides: 54 mg/dL (ref 0.0–149.0)
VLDL: 10.8 mg/dL (ref 0.0–40.0)

## 2015-03-21 LAB — TSH: TSH: 1.83 u[IU]/mL (ref 0.35–4.50)

## 2015-03-21 LAB — PSA: PSA: 0.45 ng/mL (ref 0.10–4.00)

## 2015-03-27 ENCOUNTER — Encounter: Payer: Self-pay | Admitting: Internal Medicine

## 2015-03-27 ENCOUNTER — Ambulatory Visit (INDEPENDENT_AMBULATORY_CARE_PROVIDER_SITE_OTHER): Payer: Commercial Managed Care - HMO | Admitting: Internal Medicine

## 2015-03-27 VITALS — BP 124/80 | HR 68 | Temp 97.6°F | Resp 20 | Ht 69.5 in | Wt 203.0 lb

## 2015-03-27 DIAGNOSIS — M150 Primary generalized (osteo)arthritis: Secondary | ICD-10-CM

## 2015-03-27 DIAGNOSIS — Z0000 Encounter for general adult medical examination without abnormal findings: Secondary | ICD-10-CM | POA: Diagnosis not present

## 2015-03-27 DIAGNOSIS — E034 Atrophy of thyroid (acquired): Secondary | ICD-10-CM

## 2015-03-27 DIAGNOSIS — E785 Hyperlipidemia, unspecified: Secondary | ICD-10-CM

## 2015-03-27 DIAGNOSIS — Z85850 Personal history of malignant neoplasm of thyroid: Secondary | ICD-10-CM

## 2015-03-27 DIAGNOSIS — E890 Postprocedural hypothyroidism: Secondary | ICD-10-CM

## 2015-03-27 DIAGNOSIS — M159 Polyosteoarthritis, unspecified: Secondary | ICD-10-CM

## 2015-03-27 NOTE — Progress Notes (Signed)
Pre visit review using our clinic review tool, if applicable. No additional management support is needed unless otherwise documented below in the visit note. 

## 2015-03-27 NOTE — Progress Notes (Signed)
Patient ID: Carlos David, male   DOB: 21-Oct-1946, 69 y.o.   MRN: [id number]  Subjective:    Patient ID: Carlos David, male    DOB: 1946-07-06, 69 y.o.   MRN: [id number]  HPI   History of Present Illness:   69  year-old patient who is seen today for a wellness exam.  He enjoys excellent health;  history of hypothyroidism and mild dyslipidemia. No concerns or complaints.  has been evaluated recently by  Urology but not in over one year. His last colonoscopy by [facility] was 2006 and he has received a recall letter.  He wishes to switch to [facility]  Here for Medicare AWV:  1. Risk factors based on Past M, S, F history: cardiovascular risk factors include a history of dyslipidemia; mother had an MI at age 35  2. Physical Activities: goes to his health club at least 3 times per week  3. Depression/mood: no history of depression or mood disorder  4. Hearing: no hearing deficits  5. ADL's: independent in all aspects of daily living  6. Fall Risk: low  7. Home Safety: no problems identified  8. Height, weight, &visual acuity: height and weight stable. No difficulty with visual acuity  9. Counseling: heart healthy diet regular exercise. All encouraged  10. Labs ordered based on risk factors: laboratory profile including lipid panel will be reviewed. A TSH will be reviewed  11. Referral Coordination- not appropriate at this time  12. Care Plan- continue heart healthy diet and regular. Exercise regimen  13. Cognitive Assessment- alert and oriented, with normal affect. No cognitive dysfunction ; handles all executive functions without difficulty  14.  Preventive services will include annual health assessments with screening lab.  He'll be set up for a follow-up colonoscopy since it has been a 10 year interval.  Annual eye  examinations recommended. 15.  Provider list update includes GI primary care medicine and ophthalmology.  He sees urology.  Occasionally  Allergies:  1) ! 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  HENT: Negative for congestion, dental problem, ear pain, hearing loss, mouth sores, rhinorrhea, sinus pressure, sneezing, tinnitus, trouble swallowing and voice change.   Eyes: Negative for photophobia, pain, redness and visual disturbance.  Respiratory: Negative for apnea, cough, choking, chest tightness, shortness of breath and wheezing.   Cardiovascular: Negative for chest pain, palpitations and leg swelling.  Gastrointestinal: Negative for nausea, vomiting, abdominal pain, diarrhea, constipation, blood in stool, abdominal distention, anal bleeding and rectal pain.  Genitourinary: Negative for dysuria, urgency, frequency, hematuria, flank pain, decreased urine volume, discharge, penile swelling, scrotal swelling, difficulty urinating, genital sores and testicular pain.  Musculoskeletal: Positive for joint swelling (mild swelling in the small joints of the hands). Negative for myalgias, back pain, arthralgias, gait problem, neck pain and neck stiffness.  Skin: Negative for color change, rash and wound.  Neurological: Negative for dizziness, tremors, seizures, syncope, facial asymmetry,  speech difficulty, weakness, light-headedness, numbness and headaches.  Hematological: Negative for adenopathy. Does not bruise/bleed easily.  Psychiatric/Behavioral: Negative for suicidal ideas, hallucinations, behavioral  problems, confusion, sleep disturbance, self-injury, dysphoric mood, decreased concentration and agitation. The patient is not nervous/anxious.        Objective:   Physical Exam  Constitutional: He appears well-developed and well-nourished.  HENT:  Head: Normocephalic and atraumatic.  Right Ear: External ear normal.  Left Ear: External ear normal.  Nose: Nose normal.  Mouth/Throat: Oropharynx is clear and moist.  Eyes: Conjunctivae and EOM are normal. Pupils are equal, round, and reactive to light. No scleral icterus.  Neck: Normal range of motion. Neck supple. No JVD present. No thyromegaly present.  Surgical scars noted in the anterior and left lateral neck regions  Cardiovascular: Regular rhythm, normal heart sounds and intact distal pulses.  Exam reveals no gallop and no friction rub.   No murmur heard. Dorsalis pedis pulses faint. Posterior tibial pulses full  Pulmonary/Chest: Effort normal and breath sounds normal. He exhibits no tenderness.  Abdominal: Soft. Bowel sounds are normal. He exhibits no distension and no mass. There is no tenderness.  Genitourinary: Prostate normal and penis normal.  Musculoskeletal: Normal range of motion. He exhibits no edema or tenderness.  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  Neurological: He is alert. He has normal reflexes. No cranial nerve deficit. Coordination normal.  Skin: Skin is warm and dry. No rash noted.  Psychiatric: He has a normal mood and affect. His behavior is normal.          Assessment & Plan:   Preventive health examination  Hypothyroidism  Status post thyroidectomy. TSH normal on thyroid replacement  Dyslipidemia well controlled we'll continue atorvastatin   Recheck one year  Follow-up colonoscopy

## 2015-03-27 NOTE — Patient Instructions (Addendum)
Limit your sodium (Salt) intake    It is important that you exercise regularly, at least 20 minutes 3 to 4 times per week.  If you develop chest pain or shortness of breath seek  medical attention.  Schedule your colonoscopy to help detect colon cancer.  Return in one year for follow-up  Health Maintenance A healthy lifestyle and preventative care can promote health and wellness.  Maintain regular health, dental, and eye exams.  Eat a healthy diet. Foods like vegetables, fruits, whole grains, low-fat dairy products, and lean protein foods contain the nutrients you need and are low in calories. Decrease your intake of foods high in solid fats, added sugars, and salt. Get information about a proper diet from your health care provider, if necessary.  Regular physical exercise is one of the most important things you can do for your health. Most adults should get at least 150 minutes of moderate-intensity exercise (any activity that increases your heart rate and causes you to sweat) each week. In addition, most adults need muscle-strengthening exercises on 2 or more days a week.   Maintain a healthy weight. The body mass index (BMI) is a screening tool to identify possible weight problems. It provides an estimate of body fat based on height and weight. Your health care provider can find your BMI and can help you achieve or maintain a healthy weight. For males 20 years and older:  A BMI below 18.5 is considered underweight.  A BMI of 18.5 to 24.9 is normal.  A BMI of 25 to 29.9 is considered overweight.  A BMI of 30 and above is considered obese.  Maintain normal blood lipids and cholesterol by exercising and minimizing your intake of saturated fat. Eat a balanced diet with plenty of fruits and vegetables. Blood tests for lipids and cholesterol should begin at age 74 and be repeated every 5 years. If your lipid or cholesterol levels are high, you are over age 12, or you are at high risk for  heart disease, you may need your cholesterol levels checked more frequently.Ongoing high lipid and cholesterol levels should be treated with medicines if diet and exercise are not working.  If you smoke, find out from your health care provider how to quit. If you do not use tobacco, do not start.  Lung cancer screening is recommended for adults aged 76-80 years who are at high risk for developing lung cancer because of a history of smoking. A yearly low-dose CT scan of the lungs is recommended for people who have at least a 30-pack-year history of smoking and are current smokers or have quit within the past 15 years. A pack year of smoking is smoking an average of 1 pack of cigarettes a day for 1 year (for example, a 30-pack-year history of smoking could mean smoking 1 pack a day for 30 years or 2 packs a day for 15 years). Yearly screening should continue until the smoker has stopped smoking for at least 15 years. Yearly screening should be stopped for people who develop a health problem that would prevent them from having lung cancer treatment.  If you choose to drink alcohol, do not have more than 2 drinks per day. One drink is considered to be 12 oz (360 mL) of beer, 5 oz (150 mL) of wine, or 1.5 oz (45 mL) of liquor.  Avoid the use of street drugs. Do not share needles with anyone. Ask for help if you need support or instructions about stopping the  use of drugs.  High blood pressure causes heart disease and increases the risk of stroke. Blood pressure should be checked at least every 1-2 years. Ongoing high blood pressure should be treated with medicines if weight loss and exercise are not effective.  If you are 76-67 years old, ask your health care provider if you should take aspirin to prevent heart disease.  Diabetes screening involves taking a blood sample to check your fasting blood sugar level. This should be done once every 3 years after age 42 if you are at a normal weight and without  risk factors for diabetes. Testing should be considered at a younger age or be carried out more frequently if you are overweight and have at least 1 risk factor for diabetes.  Colorectal cancer can be detected and often prevented. Most routine colorectal cancer screening begins at the age of 28 and continues through age 11. However, your health care provider may recommend screening at an earlier age if you have risk factors for colon cancer. On a yearly basis, your health care provider may provide home test kits to check for hidden blood in the stool. A small camera at the end of a tube may be used to directly examine the colon (sigmoidoscopy or colonoscopy) to detect the earliest forms of colorectal cancer. Talk to your health care provider about this at age 18 when routine screening begins. A direct exam of the colon should be repeated every 5-10 years through age 91, unless early forms of precancerous polyps or small growths are found.  People who are at an increased risk for hepatitis B should be screened for this virus. You are considered at high risk for hepatitis B if:  You were born in a country where hepatitis B occurs often. Talk with your health care provider about which countries are considered high risk.  Your parents were born in a high-risk country and you have not received a shot to protect against hepatitis B (hepatitis B vaccine).  You have HIV or AIDS.  You use needles to inject street drugs.  You live with, or have sex with, someone who has hepatitis B.  You are a man who has sex with other men (MSM).  You get hemodialysis treatment.  You take certain medicines for conditions like cancer, organ transplantation, and autoimmune conditions.  Hepatitis C blood testing is recommended for all people born from [birth year] through 1966 and any individual with known risk factors for hepatitis C.  Healthy men should no longer receive prostate-specific antigen (PSA) blood tests as part of  routine cancer screening. Talk to your health care provider about prostate cancer screening.  Testicular cancer screening is not recommended for adolescents or adult males who have no symptoms. Screening includes self-exam, a health care provider exam, and other screening tests. Consult with your health care provider about any symptoms you have or any concerns you have about testicular cancer.  Practice safe sex. Use condoms and avoid high-risk sexual practices to reduce the spread of sexually transmitted infections (STIs).  You should be screened for STIs, including gonorrhea and chlamydia if:  You are sexually active and are younger than 24 years.  You are older than 24 years, and your health care provider tells you that you are at risk for this type of infection.  Your sexual activity has changed since you were last screened, and you are at an increased risk for chlamydia or gonorrhea. Ask your health care provider if you are at risk.  If you are at risk of being infected with HIV, it is recommended that you take a prescription medicine daily to prevent HIV infection. This is called pre-exposure prophylaxis (PrEP). You are considered at risk if:  You are a man who has sex with other men (MSM).  You are a heterosexual man who is sexually active with multiple partners.  You take drugs by injection.  You are sexually active with a partner who has HIV.  Talk with your health care provider about whether you are at high risk of being infected with HIV. If you choose to begin PrEP, you should first be tested for HIV. You should then be tested every 3 months for as long as you are taking PrEP.  Use sunscreen. Apply sunscreen liberally and repeatedly throughout the day. You should seek shade when your shadow is shorter than you. Protect yourself by wearing long sleeves, pants, a wide-brimmed hat, and sunglasses year round whenever you are outdoors.  Tell your health care provider of new moles  or changes in moles, especially if there is a change in shape or color. Also, tell your health care provider if a mole is larger than the size of a pencil eraser.  A one-time screening for abdominal aortic aneurysm (AAA) and surgical repair of large AAAs by ultrasound is recommended for men aged 33-75 years who are current or former smokers.  Stay current with your vaccines (immunizations). Document Released: 05/01/2008 Document Revised: 11/08/2013 Document Reviewed: 03/31/2011 [facility] Patient Information 2015 [place], [place]. This information is not intended to replace advice given to you by your health care provider. Make sure you discuss any questions you have with your health care provider.

## 2015-04-02 ENCOUNTER — Telehealth: Payer: Self-pay | Admitting: Internal Medicine

## 2015-04-02 NOTE — Telephone Encounter (Signed)
Pt request refill of the following: tamsulosin (FLOMAX) 0.4 MG CAPS capsule   finasteride (PROSCAR) 5 MG tablet   Pt said he can not get an appt with Dr Tresa Moore for about a month and said they were ok with Dr Raliegh Ip filling this med since he has had a physical     Phamacy:   [place]

## 2015-04-03 MED ORDER — TAMSULOSIN HCL 0.4 MG PO CAPS: ORAL_CAPSULE | ORAL | Status: DC

## 2015-04-03 MED ORDER — FINASTERIDE 5 MG PO TABS: 5.0000 mg | ORAL_TABLET | Freq: Every day | ORAL | Status: DC

## 2015-04-03 NOTE — Telephone Encounter (Signed)
Left detailed message Rx's sent to pharmacy. 

## 2015-05-15 ENCOUNTER — Telehealth: Payer: Self-pay | Admitting: Internal Medicine

## 2015-05-15 NOTE — Telephone Encounter (Signed)
Rec'd from [facility] Latino forward 8 pages to Dr. [name]

## 2015-05-25 ENCOUNTER — Encounter: Payer: Self-pay | Admitting: Internal Medicine

## 2015-06-12 ENCOUNTER — Other Ambulatory Visit: Payer: Self-pay | Admitting: Internal Medicine

## 2015-07-12 ENCOUNTER — Encounter: Payer: Self-pay | Admitting: Internal Medicine

## 2015-08-21 ENCOUNTER — Encounter: Payer: Self-pay | Admitting: Internal Medicine

## 2015-08-21 ENCOUNTER — Ambulatory Visit (INDEPENDENT_AMBULATORY_CARE_PROVIDER_SITE_OTHER): Payer: Commercial Managed Care - HMO | Admitting: Internal Medicine

## 2015-08-21 VITALS — BP 140/80 | HR 69 | Temp 98.4°F | Resp 20 | Ht 69.5 in | Wt 204.0 lb

## 2015-08-21 DIAGNOSIS — R300 Dysuria: Secondary | ICD-10-CM

## 2015-08-21 DIAGNOSIS — N400 Enlarged prostate without lower urinary tract symptoms: Secondary | ICD-10-CM

## 2015-08-21 LAB — POCT URINALYSIS DIPSTICK
Bilirubin, UA: NEGATIVE
Glucose, UA: NEGATIVE
KETONES UA: NEGATIVE
Leukocytes, UA: NEGATIVE
NITRITE UA: NEGATIVE
PH UA: 6.5
PROTEIN UA: NEGATIVE
RBC UA: NEGATIVE
Spec Grav, UA: 1.02
Urobilinogen, UA: 0.2

## 2015-08-21 NOTE — Progress Notes (Signed)
Pre visit review using our clinic review tool, if applicable. No additional management support is needed unless otherwise documented below in the visit note. 

## 2015-08-21 NOTE — Patient Instructions (Signed)
Urology follow-up as discussed  Benign Prostatic Hyperplasia An enlarged prostate (benign prostatic hyperplasia) is common in older men. You may experience the following:  Weak urine stream.  Dribbling.  Feeling like the bladder has not emptied completely.  Difficulty starting urination.  Getting up frequently at night to urinate.  Urinating more frequently during the day. HOME CARE INSTRUCTIONS  Monitor your prostatic hyperplasia for any changes. The following actions may help to alleviate any discomfort you are experiencing:  Give yourself time when you urinate.  Stay away from alcohol.  Avoid beverages containing caffeine, such as coffee, tea, and colas, because they can make the problem worse.  Avoid decongestants, antihistamines, and some prescription medicines that can make the problem worse.  Follow up with your health care provider for further treatment as recommended. SEEK MEDICAL CARE IF:  You are experiencing progressive difficulty voiding.  Your urine stream is progressively getting narrower.  You are awaking from sleep with the urge to void more frequently.  You are constantly feeling the need to void.  You experience loss of urine, especially in small amounts. SEEK IMMEDIATE MEDICAL CARE IF:   You develop increased pain with urination or are unable to urinate.  You develop severe abdominal pain, vomiting, a high fever, or fainting.  You develop back pain or blood in your urine. MAKE SURE YOU:   Understand these instructions.  Will watch your condition.  Will get help right away if you are not doing well or get worse.   This information is not intended to replace advice given to you by your health care provider. Make sure you discuss any questions you have with your health care provider.   Document Released: 11/03/2005 Document Revised: 11/24/2014 Document Reviewed: 04/05/2013 Elsevier Interactive Patient Education [employer].

## 2015-08-21 NOTE — Progress Notes (Signed)
Subjective:    Patient ID: Carlos David, male    DOB: [birth year], 69 y.o.   MRN: [id number]  HPI  69 year old patient has a history of BPH.  He is on both Proscar and Flomax.  He has senile urology periodically over the years.  Last week he has some dysuria that has improved.  He also has some worsening obstructive symptoms that also seem improved.  He has some of coffee in the morning, but no excessive caffeine use.  Last week he also thought he may have had some mild low-grade fever.  No history of prostatitis  Past Medical History  Diagnosis Date  . HYPOTHYROIDISM 12/26/2008  . HYPERLIPIDEMIA 12/26/2008  . THYROID CANCER, HX OF 12/26/2008  . PPD positive     Social History   Social History  . Marital Status: Married    Spouse Name: N/A  . Number of Children: N/A  . Years of Education: N/A   Occupational History  . Not on file.   Social History Main Topics  . Smoking status: Former Smoker    Types: Cigarettes, Pipe    Quit date: 11/17/1968  . Smokeless tobacco: Not on file  . Alcohol Use: Not on file  . Drug Use: No  . Sexual Activity: Not on file   Other Topics Concern  . Not on file   Social History Narrative    No past surgical history on file.  No family history on file.  Allergies  Allergen Reactions  . Penicillins     Current Outpatient Prescriptions on File Prior to Visit  Medication Sig Dispense Refill  . aspirin 81 MG tablet Take 81 mg by mouth daily.      Marland Kitchen atorvastatin (LIPITOR) 20 MG tablet TAKE 1 TABLET BY MOUTH ONCE DAILY 90 tablet 1  . finasteride (PROSCAR) 5 MG tablet Take 1 tablet (5 mg total) by mouth daily. 90 tablet 1  . levothyroxine (SYNTHROID, LEVOTHROID) 137 MCG tablet TAKE ONE TABLET BY MOUTH EVERY DAY 90 tablet 3  . naproxen sodium (ALEVE) 220 MG tablet Take 220 mg by mouth 2 (two) times daily as needed.     . sildenafil (VIAGRA) 100 MG tablet Take 1 tablet (100 mg total) by mouth daily as needed for erectile dysfunction. 10 tablet 2   . tamsulosin (FLOMAX) 0.4 MG CAPS capsule TAKE ONE CAPSULE EVERY DAY 90 capsule 1   No current facility-administered medications on file prior to visit.    BP 140/80 mmHg  Pulse 69  Temp(Src) 98.4 F (36.9 C) (Oral)  Resp 20  Ht 5' 9.5" (1.765 m)  Wt 204 lb (92.534 kg)  BMI 29.70 kg/m2  SpO2 98%     Review of Systems  Constitutional: Negative for fever, chills, appetite change and fatigue.  HENT: Negative for congestion, dental problem, ear pain, hearing loss, sore throat, tinnitus, trouble swallowing and voice change.   Eyes: Negative for pain, discharge and visual disturbance.  Respiratory: Negative for cough, chest tightness, wheezing and stridor.   Cardiovascular: Negative for chest pain, palpitations and leg swelling.  Gastrointestinal: Negative for nausea, vomiting, abdominal pain, diarrhea, constipation, blood in stool and abdominal distention.  Genitourinary: Positive for dysuria, urgency and difficulty urinating. Negative for hematuria, flank pain, discharge and genital sores.  Musculoskeletal: Negative for myalgias, back pain, joint swelling, arthralgias, gait problem and neck stiffness.  Skin: Negative for rash.  Neurological: Negative for dizziness, syncope, speech difficulty, weakness, numbness and headaches.  Hematological: Negative for adenopathy. Does not bruise/bleed  easily.  Psychiatric/Behavioral: Negative for behavioral problems and dysphoric mood. The patient is not nervous/anxious.        Objective:   Physical Exam  Constitutional: He is oriented to person, place, and time. He appears well-developed.  HENT:  Head: Normocephalic.  Right Ear: External ear normal.  Left Ear: External ear normal.  Eyes: Conjunctivae and EOM are normal.  Neck: Normal range of motion.  Cardiovascular: Normal rate and normal heart sounds.   Pulmonary/Chest: Breath sounds normal.  Abdominal: Bowel sounds are normal.  Musculoskeletal: Normal range of motion. He exhibits no  edema or tenderness.  Neurological: He is alert and oriented to person, place, and time.  Psychiatric: He has a normal mood and affect. His behavior is normal.          Assessment & Plan:   Symptomatic BPH.  Will follow up with urology.  No change in medical regimen.  Urinalysis reviewed and no evidence of infection Dyslipidemia.  Continue statin therapy

## 2015-09-05 ENCOUNTER — Ambulatory Visit: Payer: Self-pay | Admitting: Podiatry

## 2015-09-06 ENCOUNTER — Ambulatory Visit (AMBULATORY_SURGERY_CENTER): Payer: Self-pay | Admitting: *Deleted

## 2015-09-06 ENCOUNTER — Encounter: Payer: Self-pay | Admitting: Podiatry

## 2015-09-06 ENCOUNTER — Ambulatory Visit (INDEPENDENT_AMBULATORY_CARE_PROVIDER_SITE_OTHER): Payer: Commercial Managed Care - HMO | Admitting: Podiatry

## 2015-09-06 VITALS — BP 147/93 | HR 66 | Resp 66

## 2015-09-06 VITALS — Ht 71.0 in | Wt 203.0 lb

## 2015-09-06 DIAGNOSIS — S93602A Unspecified sprain of left foot, initial encounter: Secondary | ICD-10-CM

## 2015-09-06 DIAGNOSIS — Z1211 Encounter for screening for malignant neoplasm of colon: Secondary | ICD-10-CM

## 2015-09-06 MED ORDER — MELOXICAM 15 MG PO TABS: 15.0000 mg | ORAL_TABLET | Freq: Every day | ORAL | Status: DC

## 2015-09-06 MED ORDER — NA SULFATE-K SULFATE-MG SULF 17.5-3.13-1.6 GM/177ML PO SOLN: 1.0000 | Freq: Once | ORAL | Status: DC

## 2015-09-06 NOTE — Progress Notes (Signed)
No egg or soy allergy No issues with past sedation No diet pills No home 02 use emmi video declined  

## 2015-09-06 NOTE — Progress Notes (Signed)
Subjective:     Patient ID: Carlos David, male   DOB: 1946/05/12, 69 y.o.   MRN: [id number]  HPI This patient presents to the office with pain on the outside of his left midfoot region.  He says that he was working in his yard with his orthoses but he developed pain.  He denies redness or swelling left foot.     Review of Systems     Objective:   Physical Exam GENERAL APPEARANCE: Alert, conversant. Appropriately groomed. No acute distress.  VASCULAR: Pedal pulses palpable at  [facility] and PT bilateral.  Capillary refill time is immediate to all digits,  Normal temperature gradient.  Digital hair growth is present bilateral  NEUROLOGIC: sensation is normal to 5.07 monofilament at 5/5 sites bilateral.  Light touch is intact bilateral, Muscle strength normal.  MUSCULOSKELETAL: acceptable muscle strength, tone and stability bilateral.  Intrinsic muscluature intact bilateral.  Rectus appearance of foot and digits noted bilateral. Palpable pain noted at the distal cuboid joint left foot.  Muscle power WNL.  DERMATOLOGIC: skin color, texture, and turgor are within normal limits.  No preulcerative lesions or ulcers  are seen, no interdigital maceration noted.  No open lesions present.  Digital nails are asymptomatic. No drainage noted.      Assessment:     Foot Sprain left foot     Plan:     ROV  Injection therapy left foot.  Mobic prescribed.  He is going to [facility] this weekend and intends to be walking.

## 2015-09-19 ENCOUNTER — Encounter: Payer: Commercial Managed Care - HMO | Admitting: Internal Medicine

## 2015-09-20 ENCOUNTER — Encounter: Payer: Commercial Managed Care - HMO | Admitting: Internal Medicine

## 2015-09-27 ENCOUNTER — Other Ambulatory Visit: Payer: Self-pay | Admitting: Internal Medicine

## 2015-09-28 ENCOUNTER — Telehealth: Payer: Self-pay | Admitting: Internal Medicine

## 2015-09-28 NOTE — Telephone Encounter (Signed)
[facility] - Client [facility] Patient Name: Carlos David DOB: 08/08/1946 Initial Comment Sore throat, congestion, hoarseness Nurse Assessment Nurse: Martyn Ehrich RN, Felicia Date/Time (Eastern Time): 09/28/2015 3:40:01 PM Confirm and document reason for call. If symptomatic, describe symptoms. ---PT has sore throat mainly and a little congestion - very little cough and hoarseness for about 3 d without fever. Has the patient traveled out of the country within the last 30 days? ---No Does the patient have any new or worsening symptoms? ---Yes Will a triage be completed? ---Yes Related visit to physician within the last 2 weeks? ---No Does the PT have any chronic conditions? (i.e. diabetes, asthma, etc.) ---NoGuidelines Guideline Title Affirmed Question Affirmed Notes Sore Throat [1] Sore throat with cough/cold symptoms AND [2] present < 5 days (all triage questions negative) Sore Throat [1] Sore throat with cough/cold symptoms AND [2] present < 5 days (all triage questions negative) Final Disposition [facility] Baldwin, RN, Solmon Ice Disagree/Comply: Comply Disagree/Comply: Comply  Call Id: [id number]

## 2015-10-02 ENCOUNTER — Encounter: Payer: Self-pay | Admitting: Gastroenterology

## 2015-10-02 ENCOUNTER — Ambulatory Visit (AMBULATORY_SURGERY_CENTER): Payer: Commercial Managed Care - HMO | Admitting: Gastroenterology

## 2015-10-02 VITALS — BP 135/87 | HR 62 | Temp 95.6°F | Resp 15 | Ht 71.0 in | Wt 203.0 lb

## 2015-10-02 DIAGNOSIS — K635 Polyp of colon: Secondary | ICD-10-CM

## 2015-10-02 DIAGNOSIS — Z1211 Encounter for screening for malignant neoplasm of colon: Secondary | ICD-10-CM

## 2015-10-02 DIAGNOSIS — D125 Benign neoplasm of sigmoid colon: Secondary | ICD-10-CM | POA: Diagnosis not present

## 2015-10-02 DIAGNOSIS — D124 Benign neoplasm of descending colon: Secondary | ICD-10-CM

## 2015-10-02 MED ORDER — SODIUM CHLORIDE 0.9 % IV SOLN: 500.0000 mL | INTRAVENOUS | Status: DC

## 2015-10-02 NOTE — Op Note (Signed)
[place]  [address]. [place], [address]   COLONOSCOPY PROCEDURE REPORT PATIENT: Carlos, David  MR#: [id number] BIRTHDATE: 1946/07/03 , 40  yrs. old GENDER: male ENDOSCOPIST: Milus Banister, MD REFERRED [id number] [name], M.D. PROCEDURE DATE:  10/02/2015 PROCEDURE:   Colonoscopy, screening and Colonoscopy with snare polypectomy First Screening Colonoscopy - Avg.  risk and is 50 yrs.  old or older - No.  Prior Negative Screening - Now for repeat screening. 10 or more years since last screening  History of Adenoma - Now for follow-up colonoscopy & has been > or = to 3 yrs.  N/A  Polyps removed today? Yes ASA CLASS:   Class II INDICATIONS:Screening for colonic neoplasia and Colorectal Neoplasm Risk Assessment for this procedure is average risk (colonoscopy 2006 Dr. Amedeo Plenty was normal). MEDICATIONS: Monitored anesthesia care and Propofol 200 mg IV  DESCRIPTION OF PROCEDURE:   After the risks benefits and alternatives of the procedure were thoroughly explained, informed consent was obtained.  The digital rectal exam revealed no abnormalities of the rectum.   The LB [id number]  endoscope was introduced through the anus and advanced to the cecum, which was identified by both the appendix and ileocecal valve. No adverse events experienced.   The quality of the prep was excellent.  The instrument was then slowly withdrawn as the colon was fully examined. Estimated blood loss is zero unless otherwise noted in this procedure report.  COLON FINDINGS: Two sessile polyps ranging between 3-49mm in size were found in the descending colon.  Polypectomies were performed with a cold snare.  The resection was complete, the polyp tissue was completely retrieved and sent to histology.   A semi-pedunculated polyp measuring 8 mm in size was found in the sigmoid colon (at 40cm from anal verge, the polyp was intimately assoicated with the neck of a small diverticulum,  appeared inflammatory).  A polypectomy was performed using snare cautery. The resection was complete, the polyp tissue was completely retrieved and sent to histology.   There was mild diverticulosis noted in the left colon.   The examination was otherwise normal. Retroflexed views revealed no abnormalities. The time to cecum = 2.0 Withdrawal time = 13.1   The scope was withdrawn and the procedure completed. COMPLICATIONS: There were no immediate complications. ENDOSCOPIC IMPRESSION: 1.   Two sessile polyps ranging between 3-38mm in size were found in the descending colon; polypectomies were performed with a cold snare 2.   Semi-pedunculated polyp was found in the sigmoid colon; polypectomy was performed using snare cautery 3.   Mild diverticulosis was noted in the left colon 4.   The examination was otherwise normal RECOMMENDATIONS: If the polyp(s) removed today are proven to be adenomatous (pre-cancerous) polyps, you will need a colonoscopy in 3-5 years. Otherwise you should continue to follow colorectal cancer screening guidelines for "routine risk" patients with a colonoscopy in 10 years.  You will receive a letter within 1-2 weeks with the results of your biopsy as well as final recommendations.  Please call my office if you have not received a letter after 3 weeks.  eSigned:  Milus Banister, MD 10/02/2015 9:06 AM   PATIENT NAME:  Carlos David, Carlos David MR#: [id number]

## 2015-10-02 NOTE — Patient Instructions (Signed)
YOU HAD AN ENDOSCOPIC PROCEDURE TODAY AT [facility]:   Refer to the procedure report that was given to you for any specific questions about what was found during the examination.  If the procedure report does not answer your questions, please call your gastroenterologist to clarify.  If you requested that your care partner not be given the details of your procedure findings, then the procedure report has been included in a sealed envelope for you to review at your convenience later.  YOU SHOULD EXPECT: Some feelings of bloating in the abdomen. Passage of more gas than usual.  Walking can help get rid of the air that was put into your GI tract during the procedure and reduce the bloating. If you had a lower endoscopy (such as a colonoscopy or flexible sigmoidoscopy) you may notice spotting of blood in your stool or on the toilet paper. If you underwent a bowel prep for your procedure, you may not have a normal bowel movement for a few days.  Please Note:  You might notice some irritation and congestion in your nose or some drainage.  This is from the oxygen used during your procedure.  There is no need for concern and it should clear up in a day or so.  SYMPTOMS TO REPORT IMMEDIATELY:   Following lower endoscopy (colonoscopy or flexible sigmoidoscopy):  Excessive amounts of blood in the stool  Significant tenderness or worsening of abdominal pains  Swelling of the abdomen that is new, acute  Fever of 100F or higher  For urgent or emergent issues, a gastroenterologist can be reached at any hour by calling [phone].   DIET: Your first meal following the procedure should be a small meal and then it is ok to progress to your normal diet. Heavy or fried foods are harder to digest and may make you feel nauseous or bloated.  Likewise, meals heavy in dairy and vegetables can increase bloating.  Drink plenty of fluids but you should avoid alcoholic beverages for 24  hours.  ACTIVITY:  You should plan to take it easy for the rest of today and you should NOT DRIVE or use heavy machinery until tomorrow (because of the sedation medicines used during the test).    FOLLOW UP: Our staff will call the number listed on your records the next business day following your procedure to check on you and address any questions or concerns that you may have regarding the information given to you following your procedure. If we do not reach you, we will leave a message.  However, if you are feeling well and you are not experiencing any problems, there is no need to return our call.  We will assume that you have returned to your regular daily activities without incident.  If any biopsies were taken you will be contacted by phone or by letter within the next 1-3 weeks.  Please call us at [phone] if you have not heard about the biopsies in 3 weeks.    SIGNATURES/CONFIDENTIALITY: You and/or your care partner have signed paperwork which will be entered into your electronic medical record.  These signatures attest to the fact that that the information above on your After Visit Summary has been reviewed and is understood.  Full responsibility of the confidentiality of this discharge information lies with you and/or your care-partner.  Please review polyp, diverticulosis, and high fiber diet handouts provided. Next colonoscopy determined by pathology results.

## 2015-10-02 NOTE — Progress Notes (Signed)
Patient awakening,vss,report to rn 

## 2015-10-02 NOTE — Progress Notes (Signed)
Called to room to assist during endoscopic procedure.  Patient ID and intended procedure confirmed with present staff. Received instructions for my participation in the procedure from the performing physician.  

## 2015-10-03 ENCOUNTER — Telehealth: Payer: Self-pay

## 2015-10-03 NOTE — Telephone Encounter (Signed)
  Follow up Call-  Call back number 10/02/2015  Post procedure Call Back phone  # [phone]  Permission to leave phone message Yes     Patient questions:  Do you have a fever, pain , or abdominal swelling? No. Pain Score  0 *  Have you tolerated food without any problems? Yes.    Have you been able to return to your normal activities? Yes.    Do you have any questions about your discharge instructions: Diet   No. Medications  No. Follow up visit  No.  Do you have questions or concerns about your Care? No.  Actions: * If pain score is 4 or above: No action needed, pain <4.  No problems per the pt. maw

## 2015-10-14 ENCOUNTER — Encounter: Payer: Self-pay | Admitting: Gastroenterology

## 2015-10-15 ENCOUNTER — Ambulatory Visit (INDEPENDENT_AMBULATORY_CARE_PROVIDER_SITE_OTHER): Payer: Commercial Managed Care - HMO | Admitting: Family Medicine

## 2015-10-15 ENCOUNTER — Encounter: Payer: Self-pay | Admitting: Family Medicine

## 2015-10-15 VITALS — BP 120/80 | HR 77 | Temp 97.4°F | Resp 16 | Ht 71.0 in | Wt 201.3 lb

## 2015-10-15 DIAGNOSIS — M79662 Pain in left lower leg: Secondary | ICD-10-CM

## 2015-10-15 NOTE — Patient Instructions (Signed)
Consider over the counter Aleve or Advil for next few days Continue with icing few times a day for the first 24 hours Gentle muscle stretches as tolerated.

## 2015-10-15 NOTE — Progress Notes (Signed)
   Subjective:    Patient ID: Carlos David, male    DOB: [birth year], 69 y.o.   MRN: [id number]  HPI Acute visit for left lower extremity pain. Yesterday around 5 PM he was stepping up to get in to [place]. He had his left leg planted and was stepping up and felt "popping" sensation somewhere in his upper calf region. He had some pain somewhat poorly localized since then. Has not noted any visible swelling or bruising. No locking or giving way of the knee. He has pain only with first walking but this could slightly better after prolonged walking. Used ice and heat with minimal relief. He took some Aleve this morning.  Past Medical History  Diagnosis Date  . HYPOTHYROIDISM 12/26/2008  . HYPERLIPIDEMIA 12/26/2008  . THYROID CANCER, HX OF 1990  . PPD positive   . BPH (benign prostatic hyperplasia)   . Cancer [facility])     thyroid cancer 1990   Past Surgical History  Procedure Laterality Date  . Colonoscopy      10-ye ago-normal at [facility]  . Total thyroidectomy  1990  . Neckotomy      20 lymph nodes removed from left neck  . Eye surgery      as child    reports that he quit smoking about 46 years ago. His smoking use included Cigarettes and Pipe. He has never used smokeless tobacco. He reports that he drinks alcohol. He reports that he does not use illicit drugs. family history is negative for Colon cancer. Allergies  Allergen Reactions  . Penicillins Hives  ]\    Review of Systems  Neurological: Negative for weakness and numbness.       Objective:   Physical Exam  Constitutional: He appears well-developed and well-nourished.  Cardiovascular: Normal rate and regular rhythm.   Pulmonary/Chest: Effort normal and breath sounds normal. No respiratory distress. He has no wheezes. He has no rales.  Musculoskeletal: He exhibits no edema.  Left knee full range of motion. No effusion. No bony tenderness. No popliteal swelling. Calf no visible swelling or ecchymosis. He has some minimal  tenderness in palpating over the proximal lateral head gastrocnemius. Ligament testing left knee is normal          Assessment & Plan:  Left leg pain. Pain somewhat poorly localized. Suspect muscular gastrocnemius strain. No visible swelling or bruising. We recommended ice for the first 24 hours. Over-the-counter anti-inflammatory such as Aleve or Advil. Gentle stretches as tolerated. Follow-up if not improving of the next couple of weeks

## 2015-10-15 NOTE — Progress Notes (Signed)
Pre visit review using our clinic review tool, if applicable. No additional management support is needed unless otherwise documented below in the visit note. 

## 2015-10-18 ENCOUNTER — Telehealth: Payer: Self-pay | Admitting: Internal Medicine

## 2015-10-18 DIAGNOSIS — N138 Other obstructive and reflux uropathy: Secondary | ICD-10-CM | POA: Diagnosis not present

## 2015-10-18 DIAGNOSIS — N5201 Erectile dysfunction due to arterial insufficiency: Secondary | ICD-10-CM | POA: Diagnosis not present

## 2015-10-18 DIAGNOSIS — N401 Enlarged prostate with lower urinary tract symptoms: Secondary | ICD-10-CM | POA: Diagnosis not present

## 2015-10-18 NOTE — Telephone Encounter (Signed)
Carlos David called saying every morning Carlos David has significant bilateral hip and leg pain. Eventually it goes away but Carlos David was told by different providers that the medication "Statin" (sp?) can cause this. Carlos David's wondering if Carlos David no longer needs to take the medication and would like advice regarding this. Please call the Carlos David.  Carlos David ph# [phone] Thank you.

## 2015-10-18 NOTE — Telephone Encounter (Signed)
Please see message and advise 

## 2015-10-19 NOTE — Telephone Encounter (Signed)
Hold statin for one month to determine whether this med is a factor (doubtful)  Resume in one month if on change

## 2015-10-19 NOTE — Telephone Encounter (Signed)
Spoke to pt, told him hold statin for one month to determine whether this med is a factor (doubtful). Resume in one month if no change per Dr. Raliegh Ip. Pt verbalized understanding.

## 2015-10-26 ENCOUNTER — Encounter: Payer: Self-pay | Admitting: Internal Medicine

## 2015-10-26 ENCOUNTER — Ambulatory Visit (INDEPENDENT_AMBULATORY_CARE_PROVIDER_SITE_OTHER): Payer: Commercial Managed Care - HMO | Admitting: Internal Medicine

## 2015-10-26 VITALS — BP 120/78 | HR 70 | Temp 98.1°F | Ht 71.0 in | Wt 205.0 lb

## 2015-10-26 DIAGNOSIS — M159 Polyosteoarthritis, unspecified: Secondary | ICD-10-CM

## 2015-10-26 DIAGNOSIS — M150 Primary generalized (osteo)arthritis: Secondary | ICD-10-CM

## 2015-10-26 NOTE — Progress Notes (Signed)
Pre visit review using our clinic review tool, if applicable. No additional management support is needed unless otherwise documented below in the visit note. 

## 2015-10-26 NOTE — Progress Notes (Signed)
Subjective:    Patient ID: Carlos David, male    DOB: [birth year], 69 y.o.   MRN: [id number]  HPI  69 year old patient who does have a history of osteoarthritis.  He was seen on November 28 with a left knee sprain.  This has improved.  Due to the left knee pain.  It did alter his gait for appear to time for the past several days she has had bilateral leg pain, primarily in the lateral thigh and calf area.  Pain is aggravated by activities such as squatting, but he really has no pain with walking.  He has returned to the gym and exercises have included elliptical exercises which he does without difficulty  Past Medical History  Diagnosis Date  . HYPOTHYROIDISM 12/26/2008  . HYPERLIPIDEMIA 12/26/2008  . THYROID CANCER, HX OF 1990  . PPD positive   . BPH (benign prostatic hyperplasia)   . Cancer [facility])     thyroid cancer 1990    Social History   Social History  . Marital Status: Married    Spouse Name: N/A  . Number of Children: N/A  . Years of Education: N/A   Occupational History  . Not on file.   Social History Main Topics  . Smoking status: Former Smoker    Types: Cigarettes, Pipe    Quit date: 11/17/1968  . Smokeless tobacco: Never Used  . Alcohol Use: 0.0 oz/week    0 Standard drinks or equivalent per week     Comment: occas. beer  . Drug Use: No  . Sexual Activity: Not on file   Other Topics Concern  . Not on file   Social History Narrative    Past Surgical History  Procedure Laterality Date  . Colonoscopy      10-ye ago-normal at [facility]  . Total thyroidectomy  1990  . Neckotomy      20 lymph nodes removed from left neck  . Eye surgery      as child    Family History  Problem Relation Age of Onset  . Colon cancer Neg Hx     Allergies  Allergen Reactions  . Penicillins Hives    Current Outpatient Prescriptions on File Prior to Visit  Medication Sig Dispense Refill  . aspirin 81 MG tablet Take 81 mg by mouth daily.      Marland Kitchen atorvastatin (LIPITOR) 20  MG tablet TAKE 1 TABLET BY MOUTH ONCE DAILY 90 tablet 1  . finasteride (PROSCAR) 5 MG tablet TAKE 1 TABLET (5 MG TOTAL) BY MOUTH DAILY. 90 tablet 1  . Glucosamine-MSM-Hyaluronic Acd (JOINT HEALTH PO) Take 1 tablet by mouth daily. 1500 mg a day    . levothyroxine (SYNTHROID, LEVOTHROID) 137 MCG tablet TAKE ONE TABLET BY MOUTH EVERY DAY 90 tablet 3  . OVER THE COUNTER MEDICATION Take 2 capsules by mouth 2 (two) times daily before a meal. Nature's Pearl    . sildenafil (VIAGRA) 100 MG tablet Take 1 tablet (100 mg total) by mouth daily as needed for erectile dysfunction. 10 tablet 2  . tamsulosin (FLOMAX) 0.4 MG CAPS capsule TAKE ONE CAPSULE EVERY DAY 90 capsule 1   No current facility-administered medications on file prior to visit.    BP 120/78 mmHg  Pulse 70  Temp(Src) 98.1 F (36.7 C) (Oral)  Ht 5\' 11"  (1.803 m)  Wt 205 lb (92.987 kg)  BMI 28.60 kg/m2  SpO2 98%     Review of Systems  Constitutional: Negative for fever, chills, appetite change and  fatigue.  HENT: Negative for congestion, dental problem, ear pain, hearing loss, sore throat, tinnitus, trouble swallowing and voice change.   Eyes: Negative for pain, discharge and visual disturbance.  Respiratory: Negative for cough, chest tightness, wheezing and stridor.   Cardiovascular: Negative for chest pain, palpitations and leg swelling.  Gastrointestinal: Negative for nausea, vomiting, abdominal pain, diarrhea, constipation, blood in stool and abdominal distention.  Genitourinary: Negative for urgency, hematuria, flank pain, discharge, difficulty urinating and genital sores.  Musculoskeletal: Positive for gait problem. Negative for myalgias, back pain, joint swelling, arthralgias and neck stiffness.       Bilateral lateral thigh discomfort and stiffness  Skin: Negative for rash.  Neurological: Negative for dizziness, syncope, speech difficulty, weakness, numbness and headaches.  Hematological: Negative for adenopathy. Does not  bruise/bleed easily.  Psychiatric/Behavioral: Negative for behavioral problems and dysphoric mood. The patient is not nervous/anxious.        Objective:   Physical Exam  Constitutional: He appears well-developed and well-nourished. No distress.  Musculoskeletal:  Full range of motion both hips No muscle tenderness No tenderness over the lateral thigh or calf area No signs of active synovitis          Assessment & Plan:   Bilateral leg pain.  Appears to be more musculoligamentous.  Patient is improving.  We'll continue Aleve and observation.  He will avoid overuse.  He will call if unimproved Osteoarthritis

## 2015-10-26 NOTE — Patient Instructions (Signed)
Call or return to clinic prn if these symptoms worsen or fail to improve as anticipated.   Take Aleve 200 mg twice daily for pain or swelling

## 2015-11-01 DIAGNOSIS — H5210 Myopia, unspecified eye: Secondary | ICD-10-CM | POA: Diagnosis not present

## 2015-11-01 DIAGNOSIS — H524 Presbyopia: Secondary | ICD-10-CM | POA: Diagnosis not present

## 2015-12-01 DIAGNOSIS — M79604 Pain in right leg: Secondary | ICD-10-CM | POA: Diagnosis not present

## 2015-12-01 DIAGNOSIS — M545 Low back pain: Secondary | ICD-10-CM | POA: Diagnosis not present

## 2015-12-01 DIAGNOSIS — M4806 Spinal stenosis, lumbar region: Secondary | ICD-10-CM | POA: Diagnosis not present

## 2015-12-16 ENCOUNTER — Other Ambulatory Visit: Payer: Self-pay | Admitting: Internal Medicine

## 2015-12-24 DIAGNOSIS — M79605 Pain in left leg: Secondary | ICD-10-CM | POA: Diagnosis not present

## 2015-12-24 DIAGNOSIS — M79604 Pain in right leg: Secondary | ICD-10-CM | POA: Diagnosis not present

## 2015-12-27 DIAGNOSIS — M4806 Spinal stenosis, lumbar region: Secondary | ICD-10-CM | POA: Diagnosis not present

## 2015-12-27 DIAGNOSIS — M545 Low back pain: Secondary | ICD-10-CM | POA: Diagnosis not present

## 2016-01-09 ENCOUNTER — Other Ambulatory Visit: Payer: Self-pay | Admitting: Orthopedic Surgery

## 2016-01-09 DIAGNOSIS — M79604 Pain in right leg: Secondary | ICD-10-CM

## 2016-01-09 DIAGNOSIS — M79605 Pain in left leg: Principal | ICD-10-CM

## 2016-01-14 ENCOUNTER — Other Ambulatory Visit: Payer: Commercial Managed Care - HMO

## 2016-01-21 ENCOUNTER — Inpatient Hospital Stay
Admission: RE | Admit: 2016-01-21 | Discharge: 2016-01-21 | Disposition: A | Discharge status: Home / Self Care | Payer: Self-pay | Source: Ambulatory Visit | Attending: Orthopedic Surgery | Admitting: Orthopedic Surgery

## 2016-01-21 ENCOUNTER — Other Ambulatory Visit: Payer: Self-pay | Admitting: Orthopedic Surgery

## 2016-01-21 ENCOUNTER — Ambulatory Visit
Admission: RE | Admit: 2016-01-21 | Discharge: 2016-01-21 | Disposition: A | Discharge status: Home / Self Care | Payer: Commercial Managed Care - HMO | Source: Ambulatory Visit | Attending: Orthopedic Surgery | Admitting: Orthopedic Surgery

## 2016-01-21 DIAGNOSIS — M79604 Pain in right leg: Secondary | ICD-10-CM

## 2016-01-21 DIAGNOSIS — M79605 Pain in left leg: Principal | ICD-10-CM

## 2016-01-21 DIAGNOSIS — M4806 Spinal stenosis, lumbar region: Secondary | ICD-10-CM | POA: Diagnosis not present

## 2016-01-21 MED ORDER — ONDANSETRON HCL 4 MG/2ML IJ SOLN
4.0000 mg | Freq: Once | INTRAMUSCULAR | Status: AC
  Administered 2016-01-21: 4 mg via INTRAMUSCULAR

## 2016-01-21 MED ORDER — MEPERIDINE HCL 100 MG/ML IJ SOLN
75.0000 mg | Freq: Once | INTRAMUSCULAR | Status: AC
  Administered 2016-01-21: 75 mg via INTRAMUSCULAR

## 2016-01-21 MED ORDER — IOHEXOL 180 MG/ML  SOLN
15.0000 mL | Freq: Once | INTRAMUSCULAR | Status: AC | PRN
  Administered 2016-01-21: 15 mL via INTRATHECAL

## 2016-01-21 MED ORDER — DIAZEPAM 5 MG PO TABS
5.0000 mg | ORAL_TABLET | Freq: Once | ORAL | Status: AC
  Administered 2016-01-21: 5 mg via ORAL

## 2016-01-21 NOTE — Discharge Instructions (Signed)

## 2016-01-24 DIAGNOSIS — M545 Low back pain: Secondary | ICD-10-CM | POA: Diagnosis not present

## 2016-01-24 DIAGNOSIS — M79604 Pain in right leg: Secondary | ICD-10-CM | POA: Diagnosis not present

## 2016-01-24 DIAGNOSIS — M79605 Pain in left leg: Secondary | ICD-10-CM | POA: Diagnosis not present

## 2016-01-24 DIAGNOSIS — M4806 Spinal stenosis, lumbar region: Secondary | ICD-10-CM | POA: Diagnosis not present

## 2016-01-25 ENCOUNTER — Telehealth: Payer: Self-pay | Admitting: Internal Medicine

## 2016-01-25 DIAGNOSIS — M4800 Spinal stenosis, site unspecified: Secondary | ICD-10-CM

## 2016-01-25 NOTE — Telephone Encounter (Signed)
Patient needs a second opinion from neurosurgery.  Please schedule

## 2016-01-25 NOTE — Telephone Encounter (Signed)
Please see message and advise 

## 2016-01-25 NOTE — Telephone Encounter (Signed)
Pt states Dr Cay Schillings, [facility] wants to do spinal stenosis surgery and pt would like 2nd opinion. Pt wants to know if he should see you for the 2nd opinion or do you think he should see someone else in that area of expertise. Please advise.

## 2016-01-25 NOTE — Telephone Encounter (Signed)
Spoke to pt, told him referral to Neurosurgeon for 2nd opinion was done and someone will contact you to schedule an appt. Pt verbalized understanding.

## 2016-01-27 ENCOUNTER — Other Ambulatory Visit: Payer: Self-pay | Admitting: Internal Medicine

## 2016-02-04 DIAGNOSIS — Z6828 Body mass index (BMI) 28.0-28.9, adult: Secondary | ICD-10-CM | POA: Diagnosis not present

## 2016-02-04 DIAGNOSIS — M4806 Spinal stenosis, lumbar region: Secondary | ICD-10-CM | POA: Diagnosis not present

## 2016-02-04 DIAGNOSIS — M4316 Spondylolisthesis, lumbar region: Secondary | ICD-10-CM | POA: Diagnosis not present

## 2016-02-13 DIAGNOSIS — M545 Low back pain: Secondary | ICD-10-CM | POA: Diagnosis not present

## 2016-02-19 DIAGNOSIS — M545 Low back pain: Secondary | ICD-10-CM | POA: Diagnosis not present

## 2016-02-20 DIAGNOSIS — Z6828 Body mass index (BMI) 28.0-28.9, adult: Secondary | ICD-10-CM | POA: Diagnosis not present

## 2016-02-20 DIAGNOSIS — M4806 Spinal stenosis, lumbar region: Secondary | ICD-10-CM | POA: Diagnosis not present

## 2016-02-22 DIAGNOSIS — M545 Low back pain: Secondary | ICD-10-CM | POA: Diagnosis not present

## 2016-02-28 DIAGNOSIS — M545 Low back pain: Secondary | ICD-10-CM | POA: Diagnosis not present

## 2016-03-06 DIAGNOSIS — M545 Low back pain: Secondary | ICD-10-CM | POA: Diagnosis not present

## 2016-03-10 ENCOUNTER — Telehealth: Payer: Self-pay | Admitting: Internal Medicine

## 2016-03-10 DIAGNOSIS — M19042 Primary osteoarthritis, left hand: Principal | ICD-10-CM

## 2016-03-10 DIAGNOSIS — M19041 Primary osteoarthritis, right hand: Secondary | ICD-10-CM

## 2016-03-10 NOTE — Telephone Encounter (Signed)
Okay to refer him to hand surgeon

## 2016-03-10 NOTE — Telephone Encounter (Signed)
Please see message and advise 

## 2016-03-10 NOTE — Telephone Encounter (Signed)
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 He is asking if Dr Raliegh Ip  Would recommend someone for him

## 2016-03-11 NOTE — Telephone Encounter (Signed)
Left detailed message order for referral to hand surgeon was done and someone will contact you to schedule an appt. Any questions please call office.

## 2016-03-14 ENCOUNTER — Encounter: Payer: Self-pay | Admitting: Podiatry

## 2016-03-14 ENCOUNTER — Ambulatory Visit (INDEPENDENT_AMBULATORY_CARE_PROVIDER_SITE_OTHER): Payer: Commercial Managed Care - HMO | Admitting: Podiatry

## 2016-03-14 ENCOUNTER — Ambulatory Visit (INDEPENDENT_AMBULATORY_CARE_PROVIDER_SITE_OTHER): Payer: Commercial Managed Care - HMO

## 2016-03-14 DIAGNOSIS — R52 Pain, unspecified: Secondary | ICD-10-CM

## 2016-03-14 MED ORDER — MELOXICAM 15 MG PO TABS: 15.0000 mg | ORAL_TABLET | Freq: Every day | ORAL | Status: DC

## 2016-03-14 NOTE — Progress Notes (Signed)
Subjective:     Patient ID: Carlos David, male   DOB: 09-28-1946, 70 y.o.   MRN: [id number]  HPI this patient presents the office with chief complaint of experience pain in the big toe joint, right foot. He says his right foot was painful only in the week, which he believes was due to the rain midweek. He started to take Mobic and the pain has resolved. He has had back issues which was treated with injection therapy successfully. He presents the office today also stating he's having pain on the outside border the big toe, right foot. He presents the office for an evaluation and treatment of these conditions.  He continues to wear his orthoses.    Review of Systems     Objective:   Physical Exam GENERAL APPEARANCE: Alert, conversant. Appropriately groomed. No acute distress.  VASCULAR: Pedal pulses are  palpable at  [facility] and PT bilateral.  Capillary refill time is immediate to all digits,  Normal temperature gradient.  Digital hair growth is present bilateral  NEUROLOGIC: sensation is normal to 5.07 monofilament at 5/5 sites bilateral.  Light touch is intact bilateral, Muscle strength normal.  MUSCULOSKELETAL: acceptable muscle strength, tone and stability bilateral.  Intrinsic muscluature intact bilateral.  Rectus appearance of foot and digits noted bilateral. Marked dorsomedial exostosis big toe joint right foot.  Limitation of motion 1st MPJ  Right foot.  DERMATOLOGIC: skin color, texture, and turgor are within normal limits.  No preulcerative lesions or ulcers  are seen, no interdigital maceration noted.  No open lesions present.   No drainage noted. NAILS  Marked incurvation lateral border right hallux.  No redness or swelling noted.      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  To schedule nail surgery in future. Prescribe Mobic to take prn.   Gardiner Barefoot DPM

## 2016-03-26 ENCOUNTER — Ambulatory Visit (INDEPENDENT_AMBULATORY_CARE_PROVIDER_SITE_OTHER): Payer: Commercial Managed Care - HMO | Admitting: Internal Medicine

## 2016-03-26 ENCOUNTER — Encounter: Payer: Self-pay | Admitting: Internal Medicine

## 2016-03-26 VITALS — BP 140/88 | HR 60 | Temp 98.2°F | Resp 20 | Ht 71.0 in | Wt 204.0 lb

## 2016-03-26 DIAGNOSIS — L570 Actinic keratosis: Secondary | ICD-10-CM

## 2016-03-26 DIAGNOSIS — M150 Primary generalized (osteo)arthritis: Secondary | ICD-10-CM

## 2016-03-26 DIAGNOSIS — M159 Polyosteoarthritis, unspecified: Secondary | ICD-10-CM

## 2016-03-26 NOTE — Patient Instructions (Signed)
Cryosurgery for Skin Conditions Cryosurgery involves using cold to freeze and remove damaged skin or growths on the skin. It is also called cryotherapy. It is often used to remove warts or remove growths that could turn into cancer.  BEFORE THE PROCEDURE You do not need to do anything to prepare for this procedure. PROCEDURE  Cryosurgery takes a few minutes. It can be done in your doctor's office. There are different ways to perform the procedure.   Your doctor may use a tool (probe) with cold fluid (liquid nitrogen) flowing through it. The tool is placed on the skin. The tool freezes the skin that needs to be removed.  Your doctor may spray cold fluid directly on the skin. The spray freezes the skin that needs to be removed. AFTER THE PROCEDURE  Your treated skin will be red and puffy (swollen). This is normal.  You will be told to keep the area clean and covered with a bandage.  You will be able to go home soon after the procedure.  You may need the procedure again if the growth comes back.   This information is not intended to replace advice given to you by your health care provider. Make sure you discuss any questions you have with your health care provider.   Document Released: 01/26/2012 Document Revised: 11/08/2013 Document Reviewed: 06/03/2013 Elsevier Interactive Patient Education [employer].

## 2016-03-26 NOTE — Progress Notes (Signed)
Subjective:    Patient ID: Carlos David, male    DOB: 27-Jul-1946, 70 y.o.   MRN: [id number]  HPI  70 year old patient who has a history of osteoarthritis and also prior history of AK's.  He is followed by [facility] orth[facility]. His chief complaint today is a lesion involving the dorsal aspect of his left hand that has been there for a couple of months.  It is slightly pruritic and scaly  Past Medical History  Diagnosis Date  . HYPOTHYROIDISM 12/26/2008  . HYPERLIPIDEMIA 12/26/2008  . THYROID CANCER, HX OF 1990  . PPD positive   . BPH (benign prostatic hyperplasia)   . Cancer [facility])     thyroid cancer 1990     Social History   Social History  . Marital Status: Married    Spouse Name: N/A  . Number of Children: N/A  . Years of Education: N/A   Occupational History  . Not on file.   Social History Main Topics  . Smoking status: Former Smoker    Types: Cigarettes, Pipe    Quit date: 11/17/1968  . Smokeless tobacco: Never Used  . Alcohol Use: 0.0 oz/week    0 Standard drinks or equivalent per week     Comment: occas. beer  . Drug Use: No  . Sexual Activity: Not on file   Other Topics Concern  . Not on file   Social History Narrative    Past Surgical History  Procedure Laterality Date  . Colonoscopy      10-ye ago-normal at [facility]  . Total thyroidectomy  1990  . Neckotomy      20 lymph nodes removed from left neck  . Eye surgery      as child    Family History  Problem Relation Age of Onset  . Colon cancer Neg Hx     Allergies  Allergen Reactions  . Penicillins Hives    Current Outpatient Prescriptions on File Prior to Visit  Medication Sig Dispense Refill  . aspirin 81 MG tablet Take 81 mg by mouth daily.      Marland Kitchen atorvastatin (LIPITOR) 20 MG tablet TAKE 1 TABLET BY MOUTH ONCE DAILY 90 tablet 1  . finasteride (PROSCAR) 5 MG tablet TAKE 1 TABLET (5 MG TOTAL) BY MOUTH DAILY. 90 tablet 1  . Glucosamine-MSM-Hyaluronic Acd (JOINT HEALTH PO) Take 1 tablet  by mouth daily. 1500 mg a day    . levothyroxine (SYNTHROID, LEVOTHROID) 137 MCG tablet TAKE ONE TABLET BY MOUTH EVERY DAY 90 tablet 3  . meloxicam (MOBIC) 15 MG tablet Take 1 tablet (15 mg total) by mouth daily. 30 tablet 0  . OVER THE COUNTER MEDICATION Take 2 capsules by mouth 2 (two) times daily before a meal. Nature's Pearl    . sildenafil (VIAGRA) 100 MG tablet Take 1 tablet (100 mg total) by mouth daily as needed for erectile dysfunction. 10 tablet 2  . tamsulosin (FLOMAX) 0.4 MG CAPS capsule TAKE ONE CAPSULE EVERY DAY 90 capsule 1   No current facility-administered medications on file prior to visit.    BP 140/88 mmHg  Pulse 60  Temp(Src) 98.2 F (36.8 C) (Oral)  Resp 20  Ht 5\' 11"  (1.803 m)  Wt 204 lb (92.534 kg)  BMI 28.46 kg/m2  SpO2 98%     Review of Systems  Musculoskeletal: Positive for arthralgias.  Skin: Positive for rash.       Objective:   Physical Exam  Constitutional: He appears well-developed and well-nourished. No distress.  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  Will treat with cryotherapy.  Patient was told this could possibly be a small BCE and if it reoccurs to follow-up with dermatology Osteoarthritis  CPX as scheduled

## 2016-03-28 DIAGNOSIS — M1812 Unilateral primary osteoarthritis of first carpometacarpal joint, left hand: Secondary | ICD-10-CM | POA: Diagnosis not present

## 2016-03-28 DIAGNOSIS — M1811 Unilateral primary osteoarthritis of first carpometacarpal joint, right hand: Secondary | ICD-10-CM | POA: Diagnosis not present

## 2016-03-28 DIAGNOSIS — M180 Bilateral primary osteoarthritis of first carpometacarpal joints: Secondary | ICD-10-CM | POA: Diagnosis not present

## 2016-04-17 DIAGNOSIS — Z6829 Body mass index (BMI) 29.0-29.9, adult: Secondary | ICD-10-CM | POA: Diagnosis not present

## 2016-04-17 DIAGNOSIS — M4806 Spinal stenosis, lumbar region: Secondary | ICD-10-CM | POA: Diagnosis not present

## 2016-04-17 DIAGNOSIS — M4316 Spondylolisthesis, lumbar region: Secondary | ICD-10-CM | POA: Diagnosis not present

## 2016-04-17 DIAGNOSIS — R030 Elevated blood-pressure reading, without diagnosis of hypertension: Secondary | ICD-10-CM | POA: Diagnosis not present

## 2016-04-26 ENCOUNTER — Other Ambulatory Visit: Payer: Self-pay | Admitting: Internal Medicine

## 2016-05-25 ENCOUNTER — Other Ambulatory Visit: Payer: Self-pay | Admitting: Podiatry

## 2016-06-12 ENCOUNTER — Other Ambulatory Visit: Payer: Self-pay | Admitting: Internal Medicine

## 2016-06-12 ENCOUNTER — Other Ambulatory Visit (INDEPENDENT_AMBULATORY_CARE_PROVIDER_SITE_OTHER): Payer: Commercial Managed Care - HMO

## 2016-06-12 DIAGNOSIS — Z0000 Encounter for general adult medical examination without abnormal findings: Secondary | ICD-10-CM | POA: Diagnosis not present

## 2016-06-12 LAB — HEPATIC FUNCTION PANEL
ALBUMIN: 4.4 g/dL (ref 3.5–5.2)
ALT: 30 U/L (ref 0–53)
AST: 25 U/L (ref 0–37)
Alkaline Phosphatase: 65 U/L (ref 39–117)
Bilirubin, Direct: 0.2 mg/dL (ref 0.0–0.3)
TOTAL PROTEIN: 6.7 g/dL (ref 6.0–8.3)
Total Bilirubin: 0.7 mg/dL (ref 0.2–1.2)

## 2016-06-12 LAB — TSH: TSH: 1.02 u[IU]/mL (ref 0.35–4.50)

## 2016-06-12 LAB — POC URINALSYSI DIPSTICK (AUTOMATED)
BILIRUBIN UA: NEGATIVE
Blood, UA: NEGATIVE
Glucose, UA: NEGATIVE
Ketones, UA: NEGATIVE
LEUKOCYTES UA: NEGATIVE
NITRITE UA: NEGATIVE
PH UA: 6
Protein, UA: NEGATIVE
Spec Grav, UA: 1.015
Urobilinogen, UA: 0.2

## 2016-06-12 LAB — CBC WITH DIFFERENTIAL/PLATELET
BASOS ABS: 0 10*3/uL (ref 0.0–0.1)
Basophils Relative: 0.5 % (ref 0.0–3.0)
EOS ABS: 0.1 10*3/uL (ref 0.0–0.7)
Eosinophils Relative: 2.1 % (ref 0.0–5.0)
HCT: 44.5 % (ref 39.0–52.0)
Hemoglobin: 15.1 g/dL (ref 13.0–17.0)
LYMPHS ABS: 1.6 10*3/uL (ref 0.7–4.0)
Lymphocytes Relative: 31.9 % (ref 12.0–46.0)
MCHC: 33.9 g/dL (ref 30.0–36.0)
MCV: 88.6 fl (ref 78.0–100.0)
MONO ABS: 0.5 10*3/uL (ref 0.1–1.0)
MONOS PCT: 10.4 % (ref 3.0–12.0)
NEUTROS ABS: 2.8 10*3/uL (ref 1.4–7.7)
NEUTROS PCT: 55.1 % (ref 43.0–77.0)
Platelets: 157 10*3/uL (ref 150.0–400.0)
RBC: 5.02 Mil/uL (ref 4.22–5.81)
RDW: 13.7 % (ref 11.5–15.5)
WBC: 5.1 10*3/uL (ref 4.0–10.5)

## 2016-06-12 LAB — PSA: PSA: 0.32 ng/mL (ref 0.10–4.00)

## 2016-06-12 LAB — LIPID PANEL
CHOLESTEROL: 159 mg/dL (ref 0–200)
HDL: 55.7 mg/dL (ref >39.00)
LDL Cholesterol: 83 mg/dL (ref 0–99)
NonHDL: 103.18
Total CHOL/HDL Ratio: 3
Triglycerides: 103 mg/dL (ref 0.0–149.0)
VLDL: 20.6 mg/dL (ref 0.0–40.0)

## 2016-06-12 LAB — BASIC METABOLIC PANEL
BUN: 23 mg/dL (ref 6–23)
CALCIUM: 9.1 mg/dL (ref 8.4–10.5)
CO2: 26 meq/L (ref 19–32)
CREATININE: 1.03 mg/dL (ref 0.40–1.50)
Chloride: 107 mEq/L (ref 96–112)
GFR: 75.59 mL/min (ref >60.00)
GLUCOSE: 91 mg/dL (ref 70–99)
Potassium: 4.2 mEq/L (ref 3.5–5.1)
Sodium: 138 mEq/L (ref 135–145)

## 2016-06-17 ENCOUNTER — Encounter: Payer: Self-pay | Admitting: Internal Medicine

## 2016-06-17 ENCOUNTER — Ambulatory Visit (INDEPENDENT_AMBULATORY_CARE_PROVIDER_SITE_OTHER): Payer: Commercial Managed Care - HMO | Admitting: Internal Medicine

## 2016-06-17 VITALS — BP 148/92 | HR 68 | Temp 97.6°F | Ht 69.0 in | Wt 200.8 lb

## 2016-06-17 DIAGNOSIS — E039 Hypothyroidism, unspecified: Secondary | ICD-10-CM | POA: Diagnosis not present

## 2016-06-17 DIAGNOSIS — E785 Hyperlipidemia, unspecified: Secondary | ICD-10-CM | POA: Diagnosis not present

## 2016-06-17 DIAGNOSIS — M159 Polyosteoarthritis, unspecified: Secondary | ICD-10-CM

## 2016-06-17 DIAGNOSIS — Z0000 Encounter for general adult medical examination without abnormal findings: Secondary | ICD-10-CM

## 2016-06-17 DIAGNOSIS — M150 Primary generalized (osteo)arthritis: Secondary | ICD-10-CM | POA: Diagnosis not present

## 2016-06-17 MED ORDER — SILDENAFIL CITRATE 20 MG PO TABS: 20.0000 mg | ORAL_TABLET | Freq: Three times a day (TID) | ORAL | Status: DC

## 2016-06-17 MED ORDER — SILDENAFIL CITRATE 20 MG PO TABS: 20.0000 mg | ORAL_TABLET | Freq: Every day | ORAL | Status: DC | PRN

## 2016-06-17 NOTE — Patient Instructions (Signed)

## 2016-06-17 NOTE — Progress Notes (Signed)
Pre visit review using our clinic review tool, if applicable. No additional management support is needed unless otherwise documented below in the visit note. 

## 2016-06-17 NOTE — Progress Notes (Signed)
Patient ID: Carlos David, male   DOB: 1946-01-04, 70 y.o.   MRN: [id number]  Subjective:    Patient ID: Carlos David, male    DOB: 03-Mar-1946, 70 y.o.   MRN: [id number]  HPI   History of Present Illness:   70   year-old patient who is seen today for a wellness exam.  He enjoys excellent health;  history of hypothyroidism and mild dyslipidemia. His last colonoscopy was November 2016.  He has been seen by urology in the past; last visit December 2016 He has been followed by orthopedics for spinal stenosis.  This has improved after a epidural.  He also has received some physical therapy   Past Medical History:  Diagnosis Date  . BPH (benign prostatic hyperplasia)   . Cancer ([place])    thyroid cancer 1990  . HYPERLIPIDEMIA 12/26/2008  . HYPOTHYROIDISM 12/26/2008  . PPD positive   . THYROID CANCER, HX OF 1990     Social History   Social History  . Marital status: Married    Spouse name: N/A  . Number of children: N/A  . Years of education: N/A   Occupational History  . Not on file.   Social History Main Topics  . Smoking status: Former Smoker    Types: Cigarettes, Pipe    Quit date: 11/17/1968  . Smokeless tobacco: Never Used  . Alcohol use 0.0 oz/week     Comment: occas. beer  . Drug use: No  . Sexual activity: Not on file   Other Topics Concern  . Not on file   Social History Narrative  . No narrative on file    Past Surgical History:  Procedure Laterality Date  . COLONOSCOPY     10-ye ago-normal at [facility]  . EYE SURGERY     as child  . neckotomy     20 lymph nodes removed from left neck  . TOTAL THYROIDECTOMY  1990    Family History  Problem Relation Age of Onset  . Colon cancer Neg Hx     Allergies  Allergen Reactions  . Penicillins Hives    Current Outpatient Prescriptions on File Prior to Visit  Medication Sig Dispense Refill  . acetaminophen (TYLENOL 8 HOUR ARTHRITIS PAIN) 650 MG CR tablet Take 1,300 mg by mouth every 8 (eight) hours as needed  for pain.    Marland Kitchen aspirin 81 MG tablet Take 81 mg by mouth daily.      Marland Kitchen atorvastatin (LIPITOR) 20 MG tablet TAKE 1 TABLET BY MOUTH ONCE DAILY 90 tablet 0  . finasteride (PROSCAR) 5 MG tablet TAKE 1 TABLET BY MOUTH EVERY DAY 90 tablet 1  . Glucosamine-MSM-Hyaluronic Acd (JOINT HEALTH PO) Take 1 tablet by mouth daily. 1500 mg a day    . levothyroxine (SYNTHROID, LEVOTHROID) 137 MCG tablet TAKE ONE TABLET BY MOUTH EVERY DAY 90 tablet 3  . meloxicam (MOBIC) 15 MG tablet TAKE 1 TABLET (15 MG TOTAL) BY MOUTH DAILY. 30 tablet 0  . OVER THE COUNTER MEDICATION Take 2 capsules by mouth 2 (two) times daily before a meal. Nature's Pearl    . sildenafil (VIAGRA) 100 MG tablet Take 1 tablet (100 mg total) by mouth daily as needed for erectile dysfunction. 10 tablet 2  . tamsulosin (FLOMAX) 0.4 MG CAPS capsule TAKE ONE CAPSULE BY MOUTH EVERY DAY 90 capsule 1   No current facility-administered medications on file prior to visit.     There were no vitals taken for this visit.   Here  for Medicare AWV:  1. Risk factors based on Past M, S, F history: cardiovascular risk factors include a history of dyslipidemia; mother had an MI at age 8  2. Physical Activities: goes to his health club at least 3 times per week  3. Depression/mood: no history of depression or mood disorder  4. Hearing: no hearing deficits  5. ADL's: independent in all aspects of daily living  6. Fall Risk: low  7. Home Safety: no problems identified  8. Height, weight, &visual acuity: height and weight stable. No difficulty with visual acuity  9. Counseling: heart healthy diet regular exercise. All encouraged  10. Labs ordered based on risk factors: laboratory profile including lipid panel will be reviewed. A TSH will be reviewed  11. Referral Coordination- not appropriate at this time  12. Care Plan- continue heart healthy diet and regular. Exercise regimen  13. Cognitive Assessment- alert and oriented, with normal affect. No cognitive  dysfunction ; handles all executive functions without difficulty  14.  Preventive services will include annual health assessments with screening lab.  He'll be set up for a follow-up colonoscopy since it has been a 10 year interval.  Annual eye  examinations recommended. 15.  Provider list update includes GI primary care medicine and ophthalmology.  He sees urology.  Occasionally  Allergies:  1) ! 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  HENT: Negative for congestion, dental problem, ear pain, hearing loss, mouth sores, rhinorrhea, sinus pressure, sneezing, tinnitus, trouble swallowing and voice change.   Eyes: Negative for photophobia, pain, redness and visual disturbance.  Respiratory: Negative for apnea, cough, choking, chest tightness, shortness of breath and wheezing.   Cardiovascular: Negative for chest pain, palpitations and leg swelling.  Gastrointestinal: Negative for abdominal distention, abdominal pain, anal bleeding, blood in stool, constipation, diarrhea, nausea, rectal pain and vomiting.  Genitourinary: Negative for decreased urine volume, difficulty urinating, discharge, dysuria, flank pain, frequency, genital sores, hematuria, penile swelling, scrotal swelling, testicular pain and urgency.  Musculoskeletal: Positive for joint swelling (mild swelling in the small joints of the hands). Negative for arthralgias, back pain,  gait problem, myalgias, neck pain and neck stiffness.  Skin: Negative for color change, rash and wound.  Neurological: Negative for dizziness, tremors, seizures, syncope, facial asymmetry, speech difficulty, weakness, light-headedness, numbness and headaches.  Hematological: Negative for adenopathy. Does not bruise/bleed easily.  Psychiatric/Behavioral: Negative for agitation, behavioral problems, confusion, decreased concentration, dysphoric mood, hallucinations, self-injury, sleep disturbance and suicidal ideas. The patient is not nervous/anxious.        Objective:   Physical Exam  Constitutional: He appears well-developed and well-nourished.  HENT:  Head: Normocephalic and atraumatic.  Right Ear: External ear normal.  Left Ear: External ear normal.  Nose: Nose normal.  Mouth/Throat: Oropharynx is clear and moist.  Pharyngeal crowding  Eyes: Conjunctivae and EOM are normal. Pupils are equal, round, and reactive to light. No scleral icterus.  Neck:  Normal range of motion. Neck supple. No JVD present. No thyromegaly present.  Surgical scars noted in the anterior and left lateral neck regions  Cardiovascular: Regular rhythm, normal heart sounds and intact distal pulses.  Exam reveals no gallop and no friction rub.   No murmur heard. Dorsalis pedis pulses faint. Posterior tibial pulses full  Pulmonary/Chest: Effort normal and breath sounds normal. He exhibits no tenderness.  Abdominal: Soft. Bowel sounds are normal. He exhibits no distension and no mass. There is no tenderness.  Genitourinary: Prostate normal and penis normal.  Musculoskeletal: Normal range of motion. He exhibits no edema or tenderness.  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  Neurological: He is alert. He has normal reflexes. No cranial nerve deficit. Coordination normal.  Skin: Skin is warm and dry. No rash noted.  Psychiatric: He has a normal mood  and affect. His behavior is normal.          Assessment & Plan:   Preventive health examination  Hypothyroidism  Status post thyroidectomy. TSH normal on thyroid replacement  Dyslipidemia well controlled we'll continue atorvastatin  Spinal stenosis.  Follow-up orthopedics  Recheck one year

## 2016-07-02 ENCOUNTER — Other Ambulatory Visit: Payer: Self-pay | Admitting: Podiatry

## 2016-07-02 NOTE — Telephone Encounter (Signed)
Pt needs an appt prior to future refills. 

## 2016-09-11 ENCOUNTER — Other Ambulatory Visit: Payer: Self-pay | Admitting: Internal Medicine

## 2016-09-26 ENCOUNTER — Other Ambulatory Visit: Payer: Self-pay | Admitting: *Deleted

## 2016-09-26 MED ORDER — SILDENAFIL CITRATE 20 MG PO TABS: ORAL_TABLET | ORAL | 6 refills | Status: DC

## 2016-09-26 NOTE — Telephone Encounter (Signed)
Rx sent to pharmacy as requested.

## 2016-10-14 ENCOUNTER — Other Ambulatory Visit: Payer: Self-pay | Admitting: Internal Medicine

## 2016-10-14 DIAGNOSIS — M48062 Spinal stenosis, lumbar region with neurogenic claudication: Secondary | ICD-10-CM | POA: Diagnosis not present

## 2016-10-14 DIAGNOSIS — M5416 Radiculopathy, lumbar region: Secondary | ICD-10-CM | POA: Insufficient documentation

## 2016-10-20 DIAGNOSIS — N401 Enlarged prostate with lower urinary tract symptoms: Secondary | ICD-10-CM | POA: Diagnosis not present

## 2016-10-23 DIAGNOSIS — N5201 Erectile dysfunction due to arterial insufficiency: Secondary | ICD-10-CM | POA: Diagnosis not present

## 2016-10-23 DIAGNOSIS — N401 Enlarged prostate with lower urinary tract symptoms: Secondary | ICD-10-CM | POA: Diagnosis not present

## 2016-10-23 DIAGNOSIS — R3914 Feeling of incomplete bladder emptying: Secondary | ICD-10-CM | POA: Diagnosis not present

## 2016-11-24 ENCOUNTER — Ambulatory Visit: Payer: Commercial Managed Care - HMO | Admitting: Internal Medicine

## 2016-11-25 ENCOUNTER — Encounter: Payer: Self-pay | Admitting: Internal Medicine

## 2016-11-25 ENCOUNTER — Ambulatory Visit (INDEPENDENT_AMBULATORY_CARE_PROVIDER_SITE_OTHER): Payer: Medicare HMO | Admitting: Internal Medicine

## 2016-11-25 VITALS — BP 132/82 | HR 70 | Temp 97.3°F | Ht 69.0 in | Wt 201.4 lb

## 2016-11-25 DIAGNOSIS — M150 Primary generalized (osteo)arthritis: Secondary | ICD-10-CM

## 2016-11-25 DIAGNOSIS — R079 Chest pain, unspecified: Secondary | ICD-10-CM

## 2016-11-25 DIAGNOSIS — E785 Hyperlipidemia, unspecified: Secondary | ICD-10-CM

## 2016-11-25 DIAGNOSIS — M159 Polyosteoarthritis, unspecified: Secondary | ICD-10-CM

## 2016-11-25 MED ORDER — METOPROLOL TARTRATE 25 MG PO TABS: 25.0000 mg | ORAL_TABLET | Freq: Two times a day (BID) | ORAL | 3 refills | Status: DC

## 2016-11-25 MED ORDER — NITROGLYCERIN 0.4 MG SL SUBL: 0.4000 mg | SUBLINGUAL_TABLET | SUBLINGUAL | 3 refills | Status: DC | PRN

## 2016-11-25 NOTE — Progress Notes (Signed)
Pre visit review using our clinic review tool, if applicable. No additional management support is needed unless otherwise documented below in the visit note. 

## 2016-11-25 NOTE — Patient Instructions (Addendum)
Metoprolol 1 tablet twice daily Continue daily aspirin  Cardiac stress test as scheduled  Report any new or worsening symptoms  Avoid strenuous activities until evaluation completed  Hold Viagra use

## 2016-11-25 NOTE — Progress Notes (Signed)
Subjective:    Patient ID: Carlos David, male    DOB: 01-07-1946, 71 y.o.   MRN: [id number]  HPI  Thank 71 year old patient who is seen today with a chief complaint of chest pain.  This has been intermittent for at least 2 months. An episode 3 days ago that lasted several hours.  The pain is described as a soreness or achiness in the left upper anterior chest wall area.  No aggravating or alleviating factors.  No associated symptoms.  Denies any exertional symptoms. Cardiovascular risk factors include dyslipidemia. He has had no further pain over the past 3 days  Past Medical History:  Diagnosis Date  . BPH (benign prostatic hyperplasia)   . Cancer ([place])    thyroid cancer 1990  . HYPERLIPIDEMIA 12/26/2008  . HYPOTHYROIDISM 12/26/2008  . PPD positive   . THYROID CANCER, HX OF 1990     Social History   Social History  . Marital status: Married    Spouse name: N/A  . Number of children: N/A  . Years of education: N/A   Occupational History  . Not on file.   Social History Main Topics  . Smoking status: Former Smoker    Types: Cigarettes, Pipe    Quit date: 11/17/1968  . Smokeless tobacco: Never Used  . Alcohol use 0.0 oz/week     Comment: occas. beer  . Drug use: No  . Sexual activity: Not on file   Other Topics Concern  . Not on file   Social History Narrative  . No narrative on file    Past Surgical History:  Procedure Laterality Date  . COLONOSCOPY     10-ye ago-normal at [facility]  . EYE SURGERY     as child  . neckotomy     20 lymph nodes removed from left neck  . TOTAL THYROIDECTOMY  1990    Family History  Problem Relation Age of Onset  . Colon cancer Neg Hx     Allergies  Allergen Reactions  . Penicillins Hives    Current Outpatient Prescriptions on File Prior to Visit  Medication Sig Dispense Refill  . acetaminophen (TYLENOL 8 HOUR ARTHRITIS PAIN) 650 MG CR tablet Take 1,300 mg by mouth every 8 (eight) hours as needed for pain.    Marland Kitchen aspirin 81  MG tablet Take 81 mg by mouth daily.      Marland Kitchen atorvastatin (LIPITOR) 20 MG tablet TAKE 1 TABLET BY MOUTH ONCE DAILY 90 tablet 3  . finasteride (PROSCAR) 5 MG tablet TAKE 1 TABLET BY MOUTH EVERY DAY 90 tablet 1  . Glucosamine-MSM-Hyaluronic Acd (JOINT HEALTH PO) Take 1 tablet by mouth daily. 1500 mg a day    . levothyroxine (SYNTHROID, LEVOTHROID) 137 MCG tablet TAKE ONE TABLET BY MOUTH EVERY DAY 90 tablet 3  . meloxicam (MOBIC) 15 MG tablet TAKE 1 TABLET (15 MG TOTAL) BY MOUTH DAILY. 30 tablet 0  . OVER THE COUNTER MEDICATION Take 2 capsules by mouth 2 (two) times daily before a meal. Nature's Pearl    . sildenafil (REVATIO) 20 MG tablet TAKE 2-5 TABLETS AS NEEDED FOR SEXUAL ACTIVITY 50 tablet 6  . sildenafil (VIAGRA) 100 MG tablet Take 1 tablet (100 mg total) by mouth daily as needed for erectile dysfunction. 10 tablet 2  . tamsulosin (FLOMAX) 0.4 MG CAPS capsule TAKE ONE CAPSULE BY MOUTH EVERY DAY 90 capsule 1   No current facility-administered medications on file prior to visit.     BP 132/82 (BP Location:  Right Arm, Patient Position: Sitting, Cuff Size: Normal)   Pulse 70   Temp 97.3 F (36.3 C) (Oral)   Ht 5\' 9"  (1.753 m)   Wt 201 lb 6.4 oz (91.4 kg)   SpO2 98%   BMI 29.74 kg/m     Review of Systems  Constitutional: Negative for appetite change, chills, fatigue and fever.  HENT: Negative for congestion, dental problem, ear pain, hearing loss, sore throat, tinnitus, trouble swallowing and voice change.   Eyes: Negative for pain, discharge and visual disturbance.  Respiratory: Negative for cough, chest tightness, wheezing and stridor.   Cardiovascular: Positive for chest pain. Negative for palpitations and leg swelling.  Gastrointestinal: Negative for abdominal distention, abdominal pain, blood in stool, constipation, diarrhea, nausea and vomiting.  Genitourinary: Negative for difficulty urinating, discharge, flank pain, genital sores, hematuria and urgency.  Musculoskeletal:  Negative for arthralgias, back pain, gait problem, joint swelling, myalgias and neck stiffness.  Skin: Negative for rash.  Neurological: Negative for dizziness, syncope, speech difficulty, weakness, numbness and headaches.  Hematological: Negative for adenopathy. Does not bruise/bleed easily.  Psychiatric/Behavioral: Negative for behavioral problems and dysphoric mood. The patient is not nervous/anxious.        Objective:   Physical Exam  Constitutional: He is oriented to person, place, and time. He appears well-developed.  HENT:  Head: Normocephalic.  Right Ear: External ear normal.  Left Ear: External ear normal.  Eyes: Conjunctivae and EOM are normal.  Neck: Normal range of motion.  Cardiovascular: Normal rate, normal heart sounds and intact distal pulses.   Pulmonary/Chest: Breath sounds normal.  Abdominal: Bowel sounds are normal.  Musculoskeletal: Normal range of motion. He exhibits no edema or tenderness.  Neurological: He is alert and oriented to person, place, and time.  Psychiatric: He has a normal mood and affect. His behavior is normal.          Assessment & Plan:   Atypical chest pain.  Will review a 12-lead EKG.  Will schedule a nuclear stress test Will continue daily aspirin.  Until evaluation completed.  We'll add metoprolol and when necessary nitroglycerin Dyslipidemia  Nyoka Cowden

## 2016-11-28 ENCOUNTER — Ambulatory Visit (HOSPITAL_COMMUNITY): Payer: Medicare HMO | Attending: Cardiology

## 2016-11-28 DIAGNOSIS — R079 Chest pain, unspecified: Secondary | ICD-10-CM | POA: Insufficient documentation

## 2016-11-28 LAB — MYOCARDIAL PERFUSION IMAGING
CSEPEDS: 0 s
CSEPHR: 89 %
CSEPPHR: 133 {beats}/min
Estimated workload: 10.1 METS
Exercise duration (min): 9 min
LVDIAVOL: 107 mL (ref 62–150)
LVSYSVOL: 49 mL
MPHR: 149 {beats}/min
RATE: 0.32
Rest HR: 62 {beats}/min
SDS: 1
SRS: 4
SSS: 5
TID: 0.76

## 2016-11-28 MED ORDER — TECHNETIUM TC 99M TETROFOSMIN IV KIT
31.9000 | PACK | Freq: Once | INTRAVENOUS | Status: AC | PRN
  Administered 2016-11-28: 31.9 via INTRAVENOUS
  Filled 2016-11-28: qty 32

## 2016-11-28 MED ORDER — TECHNETIUM TC 99M TETROFOSMIN IV KIT
10.8000 | PACK | Freq: Once | INTRAVENOUS | Status: AC | PRN
  Administered 2016-11-28: 10.8 via INTRAVENOUS
  Filled 2016-11-28: qty 11

## 2016-12-02 ENCOUNTER — Telehealth: Payer: Self-pay | Admitting: Internal Medicine

## 2016-12-02 NOTE — Telephone Encounter (Signed)
Please call/notify patient that lab/test/procedure is normal 

## 2016-12-02 NOTE — Telephone Encounter (Signed)
See message below, please advise.

## 2016-12-02 NOTE — Telephone Encounter (Signed)
Pt had stress test on Friday and would like the results

## 2016-12-02 NOTE — Telephone Encounter (Signed)
Left a detailed message on voicemail that pt results to stress test were normal. Please call office back if he has any questions.

## 2016-12-16 DIAGNOSIS — M1811 Unilateral primary osteoarthritis of first carpometacarpal joint, right hand: Secondary | ICD-10-CM | POA: Diagnosis not present

## 2016-12-16 DIAGNOSIS — M1812 Unilateral primary osteoarthritis of first carpometacarpal joint, left hand: Secondary | ICD-10-CM | POA: Diagnosis not present

## 2016-12-16 DIAGNOSIS — M180 Bilateral primary osteoarthritis of first carpometacarpal joints: Secondary | ICD-10-CM | POA: Diagnosis not present

## 2017-01-07 ENCOUNTER — Other Ambulatory Visit: Payer: Self-pay | Admitting: Podiatry

## 2017-01-09 ENCOUNTER — Telehealth: Payer: Self-pay | Admitting: *Deleted

## 2017-01-09 MED ORDER — MELOXICAM 15 MG PO TABS: 15.0000 mg | ORAL_TABLET | Freq: Every day | ORAL | 1 refills | Status: DC

## 2017-01-09 NOTE — Telephone Encounter (Signed)
Pt states he needs a refill of the Meloxicam Dr. 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  01/09/2017-I informed pt Dr. Prudence Davidson refilled +1refill and that he would need to see him prior to future refills. Pt states understanding.

## 2017-01-12 DIAGNOSIS — H524 Presbyopia: Secondary | ICD-10-CM | POA: Diagnosis not present

## 2017-01-24 ENCOUNTER — Other Ambulatory Visit: Payer: Self-pay | Admitting: Internal Medicine

## 2017-02-03 DIAGNOSIS — M48062 Spinal stenosis, lumbar region with neurogenic claudication: Secondary | ICD-10-CM | POA: Diagnosis not present

## 2017-02-03 DIAGNOSIS — M5416 Radiculopathy, lumbar region: Secondary | ICD-10-CM | POA: Diagnosis not present

## 2017-04-06 DIAGNOSIS — M48062 Spinal stenosis, lumbar region with neurogenic claudication: Secondary | ICD-10-CM | POA: Diagnosis not present

## 2017-04-06 DIAGNOSIS — R030 Elevated blood-pressure reading, without diagnosis of hypertension: Secondary | ICD-10-CM | POA: Diagnosis not present

## 2017-04-06 DIAGNOSIS — M4316 Spondylolisthesis, lumbar region: Secondary | ICD-10-CM | POA: Diagnosis not present

## 2017-04-06 DIAGNOSIS — Z6829 Body mass index (BMI) 29.0-29.9, adult: Secondary | ICD-10-CM | POA: Diagnosis not present

## 2017-04-08 DIAGNOSIS — M5416 Radiculopathy, lumbar region: Secondary | ICD-10-CM | POA: Diagnosis not present

## 2017-04-08 DIAGNOSIS — M48062 Spinal stenosis, lumbar region with neurogenic claudication: Secondary | ICD-10-CM | POA: Diagnosis not present

## 2017-04-29 DIAGNOSIS — R030 Elevated blood-pressure reading, without diagnosis of hypertension: Secondary | ICD-10-CM | POA: Diagnosis not present

## 2017-04-29 DIAGNOSIS — M48062 Spinal stenosis, lumbar region with neurogenic claudication: Secondary | ICD-10-CM | POA: Diagnosis not present

## 2017-05-11 ENCOUNTER — Other Ambulatory Visit: Payer: Self-pay | Admitting: Internal Medicine

## 2017-06-15 ENCOUNTER — Other Ambulatory Visit: Payer: Medicare HMO

## 2017-06-15 ENCOUNTER — Other Ambulatory Visit: Payer: Commercial Managed Care - HMO | Admitting: Internal Medicine

## 2017-06-15 DIAGNOSIS — M48062 Spinal stenosis, lumbar region with neurogenic claudication: Secondary | ICD-10-CM | POA: Diagnosis not present

## 2017-06-15 DIAGNOSIS — M5416 Radiculopathy, lumbar region: Secondary | ICD-10-CM | POA: Diagnosis not present

## 2017-06-15 DIAGNOSIS — Z6828 Body mass index (BMI) 28.0-28.9, adult: Secondary | ICD-10-CM | POA: Diagnosis not present

## 2017-06-15 DIAGNOSIS — M47816 Spondylosis without myelopathy or radiculopathy, lumbar region: Secondary | ICD-10-CM | POA: Diagnosis not present

## 2017-06-22 ENCOUNTER — Ambulatory Visit (INDEPENDENT_AMBULATORY_CARE_PROVIDER_SITE_OTHER): Payer: Medicare HMO | Admitting: Internal Medicine

## 2017-06-22 ENCOUNTER — Encounter: Payer: Self-pay | Admitting: Internal Medicine

## 2017-06-22 VITALS — BP 122/80 | HR 113 | Temp 97.8°F | Ht 69.0 in | Wt 202.6 lb

## 2017-06-22 DIAGNOSIS — N400 Enlarged prostate without lower urinary tract symptoms: Secondary | ICD-10-CM

## 2017-06-22 DIAGNOSIS — Z0000 Encounter for general adult medical examination without abnormal findings: Secondary | ICD-10-CM | POA: Diagnosis not present

## 2017-06-22 DIAGNOSIS — E785 Hyperlipidemia, unspecified: Secondary | ICD-10-CM

## 2017-06-22 DIAGNOSIS — M150 Primary generalized (osteo)arthritis: Secondary | ICD-10-CM

## 2017-06-22 DIAGNOSIS — M159 Polyosteoarthritis, unspecified: Secondary | ICD-10-CM

## 2017-06-22 DIAGNOSIS — E039 Hypothyroidism, unspecified: Secondary | ICD-10-CM | POA: Diagnosis not present

## 2017-06-22 LAB — CBC WITH DIFFERENTIAL/PLATELET
Basophils Absolute: 0 K/uL (ref 0.0–0.1)
Basophils Relative: 0.8 % (ref 0.0–3.0)
Eosinophils Absolute: 0.1 K/uL (ref 0.0–0.7)
Eosinophils Relative: 2.4 % (ref 0.0–5.0)
HCT: 43.6 % (ref 39.0–52.0)
Hemoglobin: 14.8 g/dL (ref 13.0–17.0)
Lymphocytes Relative: 28.8 % (ref 12.0–46.0)
Lymphs Abs: 1.4 K/uL (ref 0.7–4.0)
MCHC: 33.9 g/dL (ref 30.0–36.0)
MCV: 90.1 fl (ref 78.0–100.0)
Monocytes Absolute: 0.5 K/uL (ref 0.1–1.0)
Monocytes Relative: 10.6 % (ref 3.0–12.0)
Neutro Abs: 2.9 K/uL (ref 1.4–7.7)
Neutrophils Relative %: 57.4 % (ref 43.0–77.0)
Platelets: 162 K/uL (ref 150.0–400.0)
RBC: 4.84 Mil/uL (ref 4.22–5.81)
RDW: 13.3 % (ref 11.5–15.5)
WBC: 5 K/uL (ref 4.0–10.5)

## 2017-06-22 LAB — COMPREHENSIVE METABOLIC PANEL
ALBUMIN: 4.2 g/dL (ref 3.5–5.2)
ALK PHOS: 55 U/L (ref 39–117)
ALT: 19 U/L (ref 0–53)
AST: 21 U/L (ref 0–37)
BILIRUBIN TOTAL: 0.7 mg/dL (ref 0.2–1.2)
BUN: 24 mg/dL — ABNORMAL HIGH (ref 6–23)
CALCIUM: 8.9 mg/dL (ref 8.4–10.5)
CHLORIDE: 106 meq/L (ref 96–112)
CO2: 25 mEq/L (ref 19–32)
Creatinine, Ser: 1.04 mg/dL (ref 0.40–1.50)
GFR: 74.54 mL/min (ref >60.00)
Glucose, Bld: 95 mg/dL (ref 70–99)
Potassium: 4 mEq/L (ref 3.5–5.1)
Sodium: 138 mEq/L (ref 135–145)
Total Protein: 6.5 g/dL (ref 6.0–8.3)

## 2017-06-22 LAB — LIPID PANEL
CHOLESTEROL: 178 mg/dL (ref 0–200)
HDL: 67.2 mg/dL (ref >39.00)
LDL CALC: 102 mg/dL — AB (ref 0–99)
NonHDL: 111.29
TRIGLYCERIDES: 47 mg/dL (ref 0.0–149.0)
Total CHOL/HDL Ratio: 3
VLDL: 9.4 mg/dL (ref 0.0–40.0)

## 2017-06-22 LAB — TSH: TSH: 0.52 u[IU]/mL (ref 0.35–4.50)

## 2017-06-22 NOTE — Progress Notes (Signed)
Subjective:    Patient ID: Carlos David, male    DOB: Apr 09, 1946, 71 y.o.   MRN: [id number]  HPI  71 year old patient who is seen today for an annual examination and subsequent Medicare wellness visit He is followed by ophthalmology and did have an exam in February of this year.  He is contemplating cataract surgery. He has a history of spinal stenosis and is followed by orthopedics. History of BPH and is followed by urology in December every year. He had a colonoscopy in 2016 that did reveal adenomatous polyps. Doing quite well except for intermittent back and leg pain.  He does have a nice benefit from epidurals and does have another one planned soon.  Past Medical History:  Diagnosis Date  . BPH (benign prostatic hyperplasia)   . Cancer ([place])    thyroid cancer 1990  . HYPERLIPIDEMIA 12/26/2008  . HYPOTHYROIDISM 12/26/2008  . PPD positive   . THYROID CANCER, HX OF 1990     Social History   Social History  . Marital status: Married    Spouse name: N/A  . Number of children: N/A  . Years of education: N/A   Occupational History  . Not on file.   Social History Main Topics  . Smoking status: Former Smoker    Types: Cigarettes, Pipe    Quit date: 11/17/1968  . Smokeless tobacco: Never Used  . Alcohol use 0.0 oz/week     Comment: occas. beer  . Drug use: No  . Sexual activity: Not on file   Other Topics Concern  . Not on file   Social History Narrative  . No narrative on file    Past Surgical History:  Procedure Laterality Date  . COLONOSCOPY     10-ye ago-normal at [facility]  . EYE SURGERY     as child  . neckotomy     20 lymph nodes removed from left neck  . TOTAL THYROIDECTOMY  1990    Family History  Problem Relation Age of Onset  . Colon cancer Neg Hx     Allergies  Allergen Reactions  . Penicillins Hives    Current Outpatient Prescriptions on File Prior to Visit  Medication Sig Dispense Refill  . acetaminophen (TYLENOL 8 HOUR ARTHRITIS PAIN) 650  MG CR tablet Take 1,300 mg by mouth every 8 (eight) hours as needed for pain.    Marland Kitchen aspirin 81 MG tablet Take 81 mg by mouth daily.      Marland Kitchen atorvastatin (LIPITOR) 20 MG tablet TAKE 1 TABLET BY MOUTH ONCE DAILY 90 tablet 3  . finasteride (PROSCAR) 5 MG tablet TAKE 1 TABLET BY MOUTH EVERY DAY 90 tablet 1  . Glucosamine-MSM-Hyaluronic Acd (JOINT HEALTH PO) Take 1 tablet by mouth daily. 1500 mg a day    . levothyroxine (SYNTHROID, LEVOTHROID) 137 MCG tablet TAKE 1 TABLET BY MOUTH EVERY DAY 90 tablet 3  . meloxicam (MOBIC) 15 MG tablet TAKE 1 TABLET (15 MG TOTAL) BY MOUTH DAILY. 30 tablet 0  . meloxicam (MOBIC) 15 MG tablet Take 1 tablet (15 mg total) by mouth daily. 30 tablet 1  . OVER THE COUNTER MEDICATION Take 2 capsules by mouth 2 (two) times daily before a meal. Nature's Pearl    . sildenafil (REVATIO) 20 MG tablet TAKE 2-5 TABLETS AS NEEDED FOR SEXUAL ACTIVITY 50 tablet 6  . sildenafil (VIAGRA) 100 MG tablet Take 1 tablet (100 mg total) by mouth daily as needed for erectile dysfunction. 10 tablet 2  . tamsulosin (  FLOMAX) 0.4 MG CAPS capsule TAKE ONE CAPSULE BY MOUTH EVERY DAY 90 capsule 1  . metoprolol tartrate (LOPRESSOR) 25 MG tablet Take 1 tablet (25 mg total) by mouth 2 (two) times daily. (Patient not taking: Reported on 06/22/2017) 180 tablet 3  . nitroGLYCERIN (NITROSTAT) 0.4 MG SL tablet Place 1 tablet (0.4 mg total) under the tongue every 5 (five) minutes as needed for chest pain. (Patient not taking: Reported on 06/22/2017) 50 tablet 3   No current facility-administered medications on file prior to visit.     BP 122/80 (BP Location: Left Arm, Patient Position: Sitting, Cuff Size: Normal)   Pulse (!) 113   Temp 97.8 F (36.6 C) (Oral)   Ht 5\' 9"  (1.753 m)   Wt 202 lb 9.6 oz (91.9 kg)   SpO2 98%   BMI 29.92 kg/m    Medicare wellness visit  1. Risk factors, based on past  M,S,F history.  Current vascular risk factors include dyslipidemia.   He did have a negative nuclear stress test  in January 2018   2.  Physical activities: remains quite active with yard work and other activities.  Occasional limited by arthritis involving the hands and low back pain secondary to spinal stenosis   3.  Depression/mood:no history of major depression or mood disorder  4.  Hearing:does have some occasional issues understanding soft, or whispered voices  5.  ADL's:independent  6.  Fall risk:low  7.  Home safety:no problems identified  8.  Height weight, and visual acuity;height and weight stable no change in visual acuity is followed by ophthalmology with bilateral cataracts  9.  Counseling:continue heart healthy diet and active lifestyle 10. Lab orders based on risk factors:laboratory update will be reviewed  11. Referral :follow-up urology  12. Care plan:we'll continue efforts at aggressive risk factor modification  13. Cognitive assessment: alert and appropriate with normal affect  14. Screening: Patient provided with a written and personalized 5-10 year screening schedule in the AVS.  We'll continue annual urology follow-ups and  now 5 year interval colonoscopies  15. Provider List Update: ophthalmology primary care urology, orthopedics    Review of Systems  Constitutional: Negative for appetite change, chills, fatigue and fever.  HENT: Negative for congestion, dental problem, ear pain, hearing loss, sore throat, tinnitus, trouble swallowing and voice change.   Eyes: Negative for pain, discharge and visual disturbance.  Respiratory: Negative for cough, chest tightness, wheezing and stridor.   Cardiovascular: Negative for chest pain, palpitations and leg swelling.  Gastrointestinal: Negative for abdominal distention, abdominal pain, blood in stool, constipation, diarrhea, nausea and vomiting.  Genitourinary: Negative for difficulty urinating, discharge, flank pain, genital sores, hematuria and urgency.  Musculoskeletal: Positive for arthralgias and back pain. Negative for  gait problem, joint swelling, myalgias and neck stiffness.  Skin: Negative for rash.  Neurological: Negative for dizziness, syncope, speech difficulty, weakness, numbness and headaches.  Hematological: Negative for adenopathy. Does not bruise/bleed easily.  Psychiatric/Behavioral: Negative for behavioral problems and dysphoric mood. The patient is not nervous/anxious.        Objective:   Physical Exam  Constitutional: He appears well-developed and well-nourished.  HENT:  Head: Normocephalic and atraumatic.  Right Ear: External ear normal.  Left Ear: External ear normal.  Nose: Nose normal.  Mouth/Throat: Oropharynx is clear and moist.  Low hanging soft palate  Eyes: Pupils are equal, round, and reactive to light. Conjunctivae and EOM are normal. No scleral icterus.  Neck: Normal range of motion. Neck supple. No JVD  present. No thyromegaly present.  Thyroidectomy scar  Cardiovascular: Regular rhythm, normal heart sounds and intact distal pulses.  Exam reveals no gallop and no friction rub.   No murmur heard. Dorsalis pedis pulses plus 1 posterior tibial pulses plus 3  Pulmonary/Chest: Effort normal and breath sounds normal. He exhibits no tenderness.  Abdominal: Soft. Bowel sounds are normal. He exhibits no distension and no mass. There is no tenderness.  Genitourinary: Penis normal.  Genitourinary Comments: uncircumcised  Musculoskeletal: Normal range of motion. He exhibits no edema or tenderness.  Lymphadenopathy:    He has no cervical adenopathy.  Neurological: He is alert. He has normal reflexes. No cranial nerve deficit. Coordination normal.  Skin: Skin is warm and dry. No rash noted.  Psychiatric: He has a normal mood and affect. His behavior is normal.          Assessment & Plan:   Preventive health examination Subsequent Medicare wellness visit History colonic polyps.  Colonoscopies at five-year tervals Chronic back pain secondary to spinal stenosis.  Follow-up  orthopedics History of BPH.  Symptoms well-controlled on present therapy.  Follow-up urology Dyslipidemia.  We'll review a lipid profile Status post thyroidectomy.  Carlos David

## 2017-06-22 NOTE — Patient Instructions (Addendum)
WE NOW OFFER   [facility] FAST TRACK!!!  SAME DAY Appointments for ACUTE CARE  Such as: Sprains, Injuries, cuts, abrasions, rashes, muscle pain, joint pain, back pain Colds, flu, sore throats, headache, allergies, cough, fever  Ear pain, sinus and eye infections Abdominal pain, nausea, vomiting, diarrhea, upset stomach Animal/insect bites  3 Easy Ways to Schedule: Walk-In Scheduling Call in scheduling Mychart Sign-up: [email or web address]   Limit your sodium (Salt) intake    It is important that you exercise regularly, at least 20 minutes 3 to 4 times per week.  If you develop chest pain or shortness of breath seek  medical attention.  Return in one year for follow-up  Urology follow-up as scheduled

## 2017-06-30 DIAGNOSIS — M48062 Spinal stenosis, lumbar region with neurogenic claudication: Secondary | ICD-10-CM | POA: Diagnosis not present

## 2017-06-30 DIAGNOSIS — R030 Elevated blood-pressure reading, without diagnosis of hypertension: Secondary | ICD-10-CM | POA: Diagnosis not present

## 2017-06-30 DIAGNOSIS — Z6828 Body mass index (BMI) 28.0-28.9, adult: Secondary | ICD-10-CM | POA: Diagnosis not present

## 2017-07-16 DIAGNOSIS — M5416 Radiculopathy, lumbar region: Secondary | ICD-10-CM | POA: Diagnosis not present

## 2017-07-16 DIAGNOSIS — M4316 Spondylolisthesis, lumbar region: Secondary | ICD-10-CM | POA: Diagnosis not present

## 2017-07-16 DIAGNOSIS — M48062 Spinal stenosis, lumbar region with neurogenic claudication: Secondary | ICD-10-CM | POA: Diagnosis not present

## 2017-07-16 DIAGNOSIS — M47816 Spondylosis without myelopathy or radiculopathy, lumbar region: Secondary | ICD-10-CM | POA: Diagnosis not present

## 2017-07-29 DIAGNOSIS — R030 Elevated blood-pressure reading, without diagnosis of hypertension: Secondary | ICD-10-CM | POA: Diagnosis not present

## 2017-07-29 DIAGNOSIS — M5416 Radiculopathy, lumbar region: Secondary | ICD-10-CM | POA: Diagnosis not present

## 2017-07-29 DIAGNOSIS — M48062 Spinal stenosis, lumbar region with neurogenic claudication: Secondary | ICD-10-CM | POA: Diagnosis not present

## 2017-08-06 ENCOUNTER — Encounter: Payer: Self-pay | Admitting: Internal Medicine

## 2017-08-28 DIAGNOSIS — M545 Low back pain: Secondary | ICD-10-CM | POA: Diagnosis not present

## 2017-08-28 DIAGNOSIS — M25551 Pain in right hip: Secondary | ICD-10-CM | POA: Diagnosis not present

## 2017-09-12 ENCOUNTER — Other Ambulatory Visit: Payer: Self-pay | Admitting: Podiatry

## 2017-09-30 ENCOUNTER — Other Ambulatory Visit: Payer: Self-pay | Admitting: Internal Medicine

## 2017-10-14 DIAGNOSIS — R030 Elevated blood-pressure reading, without diagnosis of hypertension: Secondary | ICD-10-CM | POA: Diagnosis not present

## 2017-10-14 DIAGNOSIS — M5416 Radiculopathy, lumbar region: Secondary | ICD-10-CM | POA: Diagnosis not present

## 2017-10-20 DIAGNOSIS — M25552 Pain in left hip: Secondary | ICD-10-CM | POA: Diagnosis not present

## 2017-12-14 ENCOUNTER — Encounter: Payer: Self-pay | Admitting: Internal Medicine

## 2017-12-14 DIAGNOSIS — N401 Enlarged prostate with lower urinary tract symptoms: Secondary | ICD-10-CM | POA: Diagnosis not present

## 2017-12-14 DIAGNOSIS — N5201 Erectile dysfunction due to arterial insufficiency: Secondary | ICD-10-CM | POA: Diagnosis not present

## 2017-12-14 DIAGNOSIS — R3914 Feeling of incomplete bladder emptying: Secondary | ICD-10-CM | POA: Diagnosis not present

## 2017-12-23 DIAGNOSIS — M48062 Spinal stenosis, lumbar region with neurogenic claudication: Secondary | ICD-10-CM | POA: Diagnosis not present

## 2017-12-23 DIAGNOSIS — M5416 Radiculopathy, lumbar region: Secondary | ICD-10-CM | POA: Diagnosis not present

## 2018-01-13 DIAGNOSIS — H52203 Unspecified astigmatism, bilateral: Secondary | ICD-10-CM | POA: Diagnosis not present

## 2018-01-13 DIAGNOSIS — H5203 Hypermetropia, bilateral: Secondary | ICD-10-CM | POA: Diagnosis not present

## 2018-01-13 DIAGNOSIS — H524 Presbyopia: Secondary | ICD-10-CM | POA: Diagnosis not present

## 2018-01-22 DIAGNOSIS — M5416 Radiculopathy, lumbar region: Secondary | ICD-10-CM | POA: Diagnosis not present

## 2018-01-22 DIAGNOSIS — M48062 Spinal stenosis, lumbar region with neurogenic claudication: Secondary | ICD-10-CM | POA: Diagnosis not present

## 2018-02-09 DIAGNOSIS — M13842 Other specified arthritis, left hand: Secondary | ICD-10-CM | POA: Diagnosis not present

## 2018-02-09 DIAGNOSIS — M13841 Other specified arthritis, right hand: Secondary | ICD-10-CM | POA: Diagnosis not present

## 2018-02-23 DIAGNOSIS — D0359 Melanoma in situ of other part of trunk: Secondary | ICD-10-CM | POA: Diagnosis not present

## 2018-02-23 DIAGNOSIS — D225 Melanocytic nevi of trunk: Secondary | ICD-10-CM | POA: Diagnosis not present

## 2018-03-10 DIAGNOSIS — Z6828 Body mass index (BMI) 28.0-28.9, adult: Secondary | ICD-10-CM | POA: Diagnosis not present

## 2018-03-10 DIAGNOSIS — M5416 Radiculopathy, lumbar region: Secondary | ICD-10-CM | POA: Diagnosis not present

## 2018-03-10 DIAGNOSIS — M48062 Spinal stenosis, lumbar region with neurogenic claudication: Secondary | ICD-10-CM | POA: Diagnosis not present

## 2018-03-10 DIAGNOSIS — I10 Essential (primary) hypertension: Secondary | ICD-10-CM | POA: Diagnosis not present

## 2018-03-29 DIAGNOSIS — L905 Scar conditions and fibrosis of skin: Secondary | ICD-10-CM | POA: Diagnosis not present

## 2018-03-29 DIAGNOSIS — D0352 Melanoma in situ of breast (skin) (soft tissue): Secondary | ICD-10-CM | POA: Diagnosis not present

## 2018-04-02 ENCOUNTER — Other Ambulatory Visit: Payer: Self-pay | Admitting: Internal Medicine

## 2018-05-10 DIAGNOSIS — D225 Melanocytic nevi of trunk: Secondary | ICD-10-CM | POA: Diagnosis not present

## 2018-05-10 DIAGNOSIS — D1801 Hemangioma of skin and subcutaneous tissue: Secondary | ICD-10-CM | POA: Diagnosis not present

## 2018-05-10 DIAGNOSIS — B078 Other viral warts: Secondary | ICD-10-CM | POA: Diagnosis not present

## 2018-05-10 DIAGNOSIS — Z85820 Personal history of malignant melanoma of skin: Secondary | ICD-10-CM | POA: Diagnosis not present

## 2018-05-10 DIAGNOSIS — L570 Actinic keratosis: Secondary | ICD-10-CM | POA: Diagnosis not present

## 2018-05-10 DIAGNOSIS — L738 Other specified follicular disorders: Secondary | ICD-10-CM | POA: Diagnosis not present

## 2018-05-10 DIAGNOSIS — L814 Other melanin hyperpigmentation: Secondary | ICD-10-CM | POA: Diagnosis not present

## 2018-06-23 ENCOUNTER — Ambulatory Visit (INDEPENDENT_AMBULATORY_CARE_PROVIDER_SITE_OTHER): Payer: Medicare HMO | Admitting: Internal Medicine

## 2018-06-23 ENCOUNTER — Encounter: Payer: Self-pay | Admitting: Internal Medicine

## 2018-06-23 VITALS — BP 130/84 | HR 67 | Temp 98.2°F | Ht 68.75 in | Wt 200.6 lb

## 2018-06-23 DIAGNOSIS — M159 Polyosteoarthritis, unspecified: Secondary | ICD-10-CM

## 2018-06-23 DIAGNOSIS — E785 Hyperlipidemia, unspecified: Secondary | ICD-10-CM | POA: Diagnosis not present

## 2018-06-23 DIAGNOSIS — E039 Hypothyroidism, unspecified: Secondary | ICD-10-CM | POA: Diagnosis not present

## 2018-06-23 DIAGNOSIS — M150 Primary generalized (osteo)arthritis: Secondary | ICD-10-CM | POA: Diagnosis not present

## 2018-06-23 DIAGNOSIS — Z0000 Encounter for general adult medical examination without abnormal findings: Secondary | ICD-10-CM | POA: Diagnosis not present

## 2018-06-23 LAB — CBC WITH DIFFERENTIAL/PLATELET
Basophils Absolute: 0 10*3/uL (ref 0.0–0.1)
Basophils Relative: 0.5 % (ref 0.0–3.0)
Eosinophils Absolute: 0.1 10*3/uL (ref 0.0–0.7)
Eosinophils Relative: 1.5 % (ref 0.0–5.0)
HCT: 44.1 % (ref 39.0–52.0)
Hemoglobin: 14.8 g/dL (ref 13.0–17.0)
Lymphocytes Relative: 28.1 % (ref 12.0–46.0)
Lymphs Abs: 1.5 10*3/uL (ref 0.7–4.0)
MCHC: 33.7 g/dL (ref 30.0–36.0)
MCV: 89.8 fl (ref 78.0–100.0)
Monocytes Absolute: 0.5 10*3/uL (ref 0.1–1.0)
Monocytes Relative: 8.8 % (ref 3.0–12.0)
Neutro Abs: 3.3 10*3/uL (ref 1.4–7.7)
Neutrophils Relative %: 61.1 % (ref 43.0–77.0)
Platelets: 165 10*3/uL (ref 150.0–400.0)
RBC: 4.91 Mil/uL (ref 4.22–5.81)
RDW: 13 % (ref 11.5–15.5)
WBC: 5.5 10*3/uL (ref 4.0–10.5)

## 2018-06-23 LAB — COMPREHENSIVE METABOLIC PANEL
ALT: 21 U/L (ref 0–53)
AST: 22 U/L (ref 0–37)
Albumin: 4.4 g/dL (ref 3.5–5.2)
Alkaline Phosphatase: 61 U/L (ref 39–117)
BUN: 14 mg/dL (ref 6–23)
CO2: 29 mEq/L (ref 19–32)
Calcium: 9.3 mg/dL (ref 8.4–10.5)
Chloride: 104 mEq/L (ref 96–112)
Creatinine, Ser: 1.01 mg/dL (ref 0.40–1.50)
GFR: 76.88 mL/min (ref >60.00)
Glucose, Bld: 88 mg/dL (ref 70–99)
Potassium: 4.4 mEq/L (ref 3.5–5.1)
Sodium: 139 mEq/L (ref 135–145)
Total Bilirubin: 0.8 mg/dL (ref 0.2–1.2)
Total Protein: 6.7 g/dL (ref 6.0–8.3)

## 2018-06-23 LAB — LIPID PANEL
CHOL/HDL RATIO: 2
Cholesterol: 148 mg/dL (ref 0–200)
HDL: 60.7 mg/dL (ref >39.00)
LDL Cholesterol: 75 mg/dL (ref 0–99)
NonHDL: 87.64
TRIGLYCERIDES: 65 mg/dL (ref 0.0–149.0)
VLDL: 13 mg/dL (ref 0.0–40.0)

## 2018-06-23 LAB — TSH: TSH: 0.47 u[IU]/mL (ref 0.35–4.50)

## 2018-06-23 NOTE — Patient Instructions (Addendum)
Follow-up thinks cellulitis adequate and every diagnosis septal later rather than click him offLimit your sodium (Salt) intake    It is important that you exercise regularly, at least 20 minutes 3 to 4 times per week.  If you develop chest pain or shortness of breath seek  medical attention.  Return in one year for follow-up    Health Maintenance, Male A healthy lifestyle and preventive care is important for your health and wellness. Ask your health care provider about what schedule of regular examinations is right for you. What should I know about weight and diet? Eat a Healthy Diet  Eat plenty of vegetables, fruits, whole grains, low-fat dairy products, and lean protein.  Do not eat a lot of foods high in solid fats, added sugars, or salt.  Maintain a Healthy Weight Regular exercise can help you achieve or maintain a healthy weight. You should:  Do at least 150 minutes of exercise each week. The exercise should increase your heart rate and make you sweat (moderate-intensity exercise).  Do strength-training exercises at least twice a week.  Watch Your Levels of Cholesterol and Blood Lipids  Have your blood tested for lipids and cholesterol every 5 years starting at 73 years of age. If you are at high risk for heart disease, you should start having your blood tested when you are 73 years old. You may need to have your cholesterol levels checked more often if: ? Your lipid or cholesterol levels are high. ? You are older than 73 years of age. ? You are at high risk for heart disease.  What should I know about cancer screening? Many types of cancers can be detected early and may often be prevented. Lung Cancer  You should be screened every year for lung cancer if: ? You are a current smoker who has smoked for at least 30 years. ? You are a former smoker who has quit within the past 15 years.  Talk to your health care provider about your screening options, when you should start  screening, and how often you should be screened.  Colorectal Cancer  Routine colorectal cancer screening usually begins at 73 years of age and should be repeated every 5-10 years until you are 74 years old. You may need to be screened more often if early forms of precancerous polyps or small growths are found. Your health care provider may recommend screening at an earlier age if you have risk factors for colon cancer.  Your health care provider may recommend using home test kits to check for hidden blood in the stool.  A small camera at the end of a tube can be used to examine your colon (sigmoidoscopy or colonoscopy). This checks for the earliest forms of colorectal cancer.  Prostate and Testicular Cancer  Depending on your age and overall health, your health care provider may do certain tests to screen for prostate and testicular cancer.  Talk to your health care provider about any symptoms or concerns you have about testicular or prostate cancer.  Skin Cancer  Check your skin from head to toe regularly.  Tell your health care provider about any new moles or changes in moles, especially if: ? There is a change in a mole's size, shape, or color. ? You have a mole that is larger than a pencil eraser.  Always use sunscreen. Apply sunscreen liberally and repeat throughout the day.  Protect yourself by wearing long sleeves, pants, a wide-brimmed hat, and sunglasses when outside.  What  should I know about heart disease, diabetes, and high blood pressure?  If you are 90-48 years of age, have your blood pressure checked every 3-5 years. If you are 50 years of age or older, have your blood pressure checked every year. You should have your blood pressure measured twice-once when you are at a hospital or clinic, and once when you are not at a hospital or clinic. Record the average of the two measurements. To check your blood pressure when you are not at a hospital or clinic, you can use: ? An  automated blood pressure machine at a pharmacy. ? A home blood pressure monitor.  Talk to your health care provider about your target blood pressure.  If you are between 50-72 years old, ask your health care provider if you should take aspirin to prevent heart disease.  Have regular diabetes screenings by checking your fasting blood sugar level. ? If you are at a normal weight and have a low risk for diabetes, have this test once every three years after the age of 34. ? If you are overweight and have a high risk for diabetes, consider being tested at a younger age or more often.  A one-time screening for abdominal aortic aneurysm (AAA) by ultrasound is recommended for men aged 78-75 years who are current or former smokers. What should I know about preventing infection? Hepatitis B If you have a higher risk for hepatitis B, you should be screened for this virus. Talk with your health care provider to find out if you are at risk for hepatitis B infection. Hepatitis C Blood testing is recommended for:  Everyone born from [birth year] through 1966.  Anyone with known risk factors for hepatitis C.  Sexually Transmitted Diseases (STDs)  You should be screened each year for STDs including gonorrhea and chlamydia if: ? You are sexually active and are younger than 73 years of age. ? You are older than 73 years of age and your health care provider tells you that you are at risk for this type of infection. ? Your sexual activity has changed since you were last screened and you are at an increased risk for chlamydia or gonorrhea. Ask your health care provider if you are at risk.  Talk with your health care provider about whether you are at high risk of being infected with HIV. Your health care provider may recommend a prescription medicine to help prevent HIV infection.  What else can I do?  Schedule regular health, dental, and eye exams.  Stay current with your vaccines (immunizations).  Do not use  any tobacco products, such as cigarettes, chewing tobacco, and e-cigarettes. If you need help quitting, ask your health care provider.  Limit alcohol intake to no more than 2 drinks per day. One drink equals 12 ounces of beer, 5 ounces of wine, or 1 ounces of hard liquor.  Do not use street drugs.  Do not share needles.  Ask your health care provider for help if you need support or information about quitting drugs.  Tell your health care provider if you often feel depressed.  Tell your health care provider if you have ever been abused or do not feel safe at home. This information is not intended to replace advice given to you by your health care provider. Make sure you discuss any questions you have with your health care provider. Document Released: 05/01/2008 Document Revised: 07/02/2016 Document Reviewed: 08/07/2015 Elsevier Interactive Patient Education  [employer].

## 2018-06-23 NOTE — Progress Notes (Signed)
Subjective:    Patient ID: Carlos David, male    DOB: Aug 08, 1946, 72 y.o.   MRN: [id number]  HPI  72 year old patient who is seen today for an annual clinical exam as well as subsequent Medicare wellness visit.  He continues to do quite well. He has a history of spinal stenosis and continues to have periodic epidurals with some benefit. He has a history of colonic polyps.  His last colonoscopy was 2016  He is followed by urology annually with a history of BPH  He is status post thyroidectomy for thyroid cancer.  He has a history of dyslipidemia and remains on statin therapy  Past Medical History:  Diagnosis Date  . BPH (benign prostatic hyperplasia)   . Cancer ([place])    thyroid cancer 1990  . HYPERLIPIDEMIA 12/26/2008  . HYPOTHYROIDISM 12/26/2008  . PPD positive   . THYROID CANCER, HX OF 1990     Social History   Socioeconomic History  . Marital status: Married    Spouse name: Not on file  . Number of children: Not on file  . Years of education: Not on file  . Highest education level: Not on file  Occupational History  . Not on file  Social Needs  . Financial resource strain: Not on file  . Food insecurity:    Worry: Not on file    Inability: Not on file  . Transportation needs:    Medical: Not on file    Non-medical: Not on file  Tobacco Use  . Smoking status: Former Smoker    Types: Cigarettes, Pipe    Last attempt to quit: 11/17/1968    Years since quitting: 49.6  . Smokeless tobacco: Never Used  Substance and Sexual Activity  . Alcohol use: Yes    Alcohol/week: 0.0 oz    Comment: occas. beer  . Drug use: No  . Sexual activity: Not on file  Lifestyle  . Physical activity:    Days per week: Not on file    Minutes per session: Not on file  . Stress: Not on file  Relationships  . Social connections:    Talks on phone: Not on file    Gets together: Not on file    Attends religious service: Not on file    Active member of club or organization: Not on file      Attends meetings of clubs or organizations: Not on file    Relationship status: Not on file  . Intimate partner violence:    Fear of current or ex partner: Not on file    Emotionally abused: Not on file    Physically abused: Not on file    Forced sexual activity: Not on file  Other Topics Concern  . Not on file  Social History Narrative  . Not on file    Past Surgical History:  Procedure Laterality Date  . COLONOSCOPY     10-ye ago-normal at [facility]  . EYE SURGERY     as child  . neckotomy     20 lymph nodes removed from left neck  . TOTAL THYROIDECTOMY  1990    Family History  Problem Relation Age of Onset  . Colon cancer Neg Hx     Allergies  Allergen Reactions  . Penicillins Hives    Current Outpatient Medications on File Prior to Visit  Medication Sig Dispense Refill  . acetaminophen (TYLENOL 8 HOUR ARTHRITIS PAIN) 650 MG CR tablet Take 1,300 mg by mouth every 8 (eight)  hours as needed for pain.    Marland Kitchen aspirin 81 MG tablet Take 81 mg by mouth daily.      Marland Kitchen atorvastatin (LIPITOR) 20 MG tablet TAKE 1 TABLET BY MOUTH EVERY DAY 90 tablet 3  . cyclobenzaprine (FLEXERIL) 10 MG tablet Take by mouth.    . finasteride (PROSCAR) 5 MG tablet TAKE 1 TABLET BY MOUTH EVERY DAY 90 tablet 1  . Glucosamine-MSM-Hyaluronic Acd (JOINT HEALTH PO) Take 1 tablet by mouth daily. 1500 mg a day    . ibuprofen (ADVIL,MOTRIN) 600 MG tablet TAKE 1 TABLET BY MOUTH EVERY 6 TO 8 HOURS AS NEEDED FOR PAIN  0  . levothyroxine (SYNTHROID, LEVOTHROID) 137 MCG tablet TAKE 1 TABLET BY MOUTH EVERY DAY 90 tablet 3  . meloxicam (MOBIC) 15 MG tablet TAKE 1 TABLET (15 MG TOTAL) BY MOUTH DAILY. 30 tablet 0  . meloxicam (MOBIC) 15 MG tablet Take 1 tablet (15 mg total) by mouth daily. 30 tablet 1  . metoprolol tartrate (LOPRESSOR) 25 MG tablet Take 1 tablet (25 mg total) by mouth 2 (two) times daily. 180 tablet 3  . nitroGLYCERIN (NITROSTAT) 0.4 MG SL tablet Place 1 tablet (0.4 mg total) under the tongue every 5  (five) minutes as needed for chest pain. 50 tablet 3  . OVER THE COUNTER MEDICATION Take 2 capsules by mouth 2 (two) times daily before a meal. Nature's Pearl    . sildenafil (REVATIO) 20 MG tablet TAKE 2-5 TABLETS AS NEEDED FOR SEXUAL ACTIVITY 50 tablet 6  . sildenafil (VIAGRA) 100 MG tablet Take 1 tablet (100 mg total) by mouth daily as needed for erectile dysfunction. 10 tablet 2  . tamsulosin (FLOMAX) 0.4 MG CAPS capsule TAKE ONE CAPSULE BY MOUTH EVERY DAY 90 capsule 1   No current facility-administered medications on file prior to visit.     BP 130/84 (BP Location: Right Arm, Patient Position: Sitting, Cuff Size: Large)   Pulse 67   Temp 98.2 F (36.8 C) (Oral)   Ht 5' 8.75" (1.746 m)   Wt 200 lb 9.6 oz (91 kg)   SpO2 97%   BMI 29.84 kg/m     Subsequent Medicare wellness visit  1. Risk factors, based on past  M,S,F history.  Cardiovascular risk factors include a history of dyslipidemia.  Normal nuclear stress test in 2018  2.  Physical activities: Remains quite active with very little in the way of exercise restrictions.  He does have lumbar stenosis with intermittent low back pain  3.  Depression/mood: No history major depression or mood disorder  4.  Hearing: No deficits  5.  ADL's: Independent  6.  Fall risk: Low  7.  Home safety: No issues identified  8.  Height weight, and visual acuity; height and weight stable no change in visual acuity  9.  Counseling: Continue a heart healthy diet more regular exercise encouraged  10. Lab orders based on risk factors: We will check laboratory update including lipid profile  11. Referral : Follow-up urology  12. Care plan: Continue all efforts at aggressive risk factor modification  13. Cognitive assessment: Alert and appropriate normal affect.  No cognitive dysfunction  14. Screening: Patient provided with a written and personalized 5-10 year screening schedule in the AVS. continue 5-year interval colonoscopies as well as  annual urology follow-up  15. Provider List Update: Primary care urology ophthalmology and orthopedics     Review of Systems  Constitutional: Negative for appetite change, chills, fatigue and fever.  HENT: Negative  for congestion, dental problem, ear pain, hearing loss, sore throat, tinnitus, trouble swallowing and voice change.   Eyes: Negative for pain, discharge and visual disturbance.  Respiratory: Negative for cough, chest tightness, wheezing and stridor.   Cardiovascular: Negative for chest pain, palpitations and leg swelling.  Gastrointestinal: Negative for abdominal distention, abdominal pain, blood in stool, constipation, diarrhea, nausea and vomiting.  Genitourinary: Positive for difficulty urinating. Negative for discharge, flank pain, genital sores, hematuria and urgency.  Musculoskeletal: Positive for arthralgias and back pain. Negative for gait problem, joint swelling, myalgias and neck stiffness.  Skin: Negative for rash.  Neurological: Negative for dizziness, syncope, speech difficulty, weakness, numbness and headaches.  Hematological: Negative for adenopathy. Does not bruise/bleed easily.  Psychiatric/Behavioral: Negative for behavioral problems and dysphoric mood. The patient is not nervous/anxious.        Objective:   Physical Exam  Constitutional: He appears well-developed and well-nourished.  HENT:  Head: Normocephalic and atraumatic.  Right Ear: External ear normal.  Left Ear: External ear normal.  Nose: Nose normal.  Mouth/Throat: Oropharynx is clear and moist.  Eyes: Pupils are equal, round, and reactive to light. Conjunctivae and EOM are normal. No scleral icterus.  Neck: Normal range of motion. Neck supple. No JVD present. No thyromegaly present.  Cardiovascular: Regular rhythm, normal heart sounds and intact distal pulses. Exam reveals no gallop and no friction rub.  No murmur heard. Pulmonary/Chest: Effort normal and breath sounds normal. He exhibits no  tenderness.  Abdominal: Soft. Bowel sounds are normal. He exhibits no distension and no mass. There is no tenderness.  Genitourinary: Penis normal.  Musculoskeletal: Normal range of motion. He exhibits no edema or tenderness.  Lymphadenopathy:    He has no cervical adenopathy.  Neurological: He is alert. He has normal reflexes. No cranial nerve deficit. Coordination normal.  Skin: Skin is warm and dry. No rash noted.  Psychiatric: He has a normal mood and affect. His behavior is normal.          Assessment & Plan:   Preventive health examination Subsequent Medicare wellness visit Dyslipidemia continue statin therapy Postsurgical hypothyroidism.  Continue levothyroxine Osteoarthritis Spinal stenosis BPH follow-up urology   Follow-up here in 6 to 12 months  Marletta Lor

## 2018-07-21 ENCOUNTER — Telehealth: Payer: Self-pay | Admitting: Internal Medicine

## 2018-07-21 DIAGNOSIS — M5416 Radiculopathy, lumbar region: Secondary | ICD-10-CM | POA: Diagnosis not present

## 2018-07-21 DIAGNOSIS — M48062 Spinal stenosis, lumbar region with neurogenic claudication: Secondary | ICD-10-CM | POA: Diagnosis not present

## 2018-07-21 MED ORDER — SILDENAFIL CITRATE 100 MG PO TABS: 100.0000 mg | ORAL_TABLET | Freq: Every day | ORAL | 2 refills | Status: DC | PRN

## 2018-07-21 NOTE — Telephone Encounter (Signed)
Copied from [place] [id number]. Topic: Quick Communication - Rx Refill/Question >> Jul 21, 2018  2:08 PM Neva Seat wrote: sildenafil (REVATIO) 20 MG tablet  Pt's original Rx was expired.  Pt is wanting this refilled asap. [address], [place] [address] Phone: [phone] Fax: [phone]

## 2018-07-21 NOTE — Telephone Encounter (Signed)
Rx sent in. No further action needed! 

## 2018-07-21 NOTE — Telephone Encounter (Signed)
Sildenafil (revatio) 20 mg refill Last Refill:09/26/16 # 50  Last OV: 06/23/18 PCP: [name] Pharmacy:[facility] [place], [place]   prescription expired on 09/26/17

## 2018-07-22 ENCOUNTER — Telehealth: Payer: Self-pay | Admitting: Family Medicine

## 2018-07-22 NOTE — Telephone Encounter (Signed)
Copied from [place] [id number]. Topic: General - Other >> Jul 22, 2018  3:59 PM Carolyn Stare wrote: The below RX was sent to the wrong pharmacy please resend to [facility] in [facility]    sildenafil (VIAGRA) 100 MG tablet   [facility]

## 2018-07-22 NOTE — Telephone Encounter (Signed)
Noted! Rx has been resent!

## 2018-07-23 ENCOUNTER — Other Ambulatory Visit: Payer: Self-pay | Admitting: Internal Medicine

## 2018-07-26 MED ORDER — SILDENAFIL CITRATE 20 MG PO TABS: 20.0000 mg | ORAL_TABLET | Freq: Three times a day (TID) | ORAL | 0 refills | Status: DC

## 2018-07-27 ENCOUNTER — Other Ambulatory Visit: Payer: Self-pay

## 2018-07-27 MED ORDER — SILDENAFIL CITRATE 20 MG PO TABS: 20.0000 mg | ORAL_TABLET | Freq: Three times a day (TID) | ORAL | 6 refills | Status: DC

## 2018-07-27 NOTE — Telephone Encounter (Signed)
Pt Rx has been sent on 07/26/2018 multiple times due to Rx error. Pt came in 07/27/2018 and was given a hand Rx to take to his preferred pharmacy. No other action needed!

## 2018-08-06 ENCOUNTER — Ambulatory Visit: Payer: Medicare HMO | Admitting: Podiatry

## 2018-08-06 DIAGNOSIS — M79676 Pain in unspecified toe(s): Secondary | ICD-10-CM

## 2018-08-06 DIAGNOSIS — L600 Ingrowing nail: Secondary | ICD-10-CM

## 2018-08-06 MED ORDER — DOXYCYCLINE HYCLATE 100 MG PO TABS: 100.0000 mg | ORAL_TABLET | Freq: Two times a day (BID) | ORAL | 0 refills | Status: DC

## 2018-08-06 NOTE — Patient Instructions (Signed)

## 2018-08-06 NOTE — Progress Notes (Addendum)
Subjective:  Patient ID: Carlos David, male    DOB: 11-11-1946,  MRN: [id number]  Chief Complaint  Patient presents with  . Ingrown Toenail    right great toenail, lateral border    72 y.o. male presents with the above complaint. Complains of ingrown toenail to the right great toe outside border. Has attempted to cut it out himself. Going to [place] next week.  Review of Systems: Negative except as noted in the HPI. Denies N/V/F/Ch.  Past Medical History:  Diagnosis Date  . BPH (benign prostatic hyperplasia)   . Cancer ([place])    thyroid cancer 1990  . HYPERLIPIDEMIA 12/26/2008  . HYPOTHYROIDISM 12/26/2008  . PPD positive   . THYROID CANCER, HX OF 1990    Current Outpatient Medications:  .  acetaminophen (TYLENOL 8 HOUR ARTHRITIS PAIN) 650 MG CR tablet, Take 1,300 mg by mouth every 8 (eight) hours as needed for pain., Disp: , Rfl:  .  aspirin 81 MG tablet, Take 81 mg by mouth daily.  , Disp: , Rfl:  .  atorvastatin (LIPITOR) 20 MG tablet, TAKE 1 TABLET BY MOUTH EVERY DAY, Disp: 90 tablet, Rfl: 3 .  cyclobenzaprine (FLEXERIL) 10 MG tablet, Take by mouth., Disp: , Rfl:  .  finasteride (PROSCAR) 5 MG tablet, TAKE 1 TABLET BY MOUTH EVERY DAY, Disp: 90 tablet, Rfl: 1 .  Glucosamine-MSM-Hyaluronic Acd (JOINT HEALTH PO), Take 1 tablet by mouth daily. 1500 mg a day, Disp: , Rfl:  .  ibuprofen (ADVIL,MOTRIN) 600 MG tablet, TAKE 1 TABLET BY MOUTH EVERY 6 TO 8 HOURS AS NEEDED FOR PAIN, Disp: , Rfl: 0 .  levothyroxine (SYNTHROID, LEVOTHROID) 137 MCG tablet, TAKE 1 TABLET BY MOUTH EVERY DAY, Disp: 90 tablet, Rfl: 3 .  meloxicam (MOBIC) 15 MG tablet, TAKE 1 TABLET (15 MG TOTAL) BY MOUTH DAILY., Disp: 30 tablet, Rfl: 0 .  meloxicam (MOBIC) 15 MG tablet, Take 1 tablet (15 mg total) by mouth daily., Disp: 30 tablet, Rfl: 1 .  metoprolol tartrate (LOPRESSOR) 25 MG tablet, Take 1 tablet (25 mg total) by mouth 2 (two) times daily., Disp: 180 tablet, Rfl: 3 .  nitroGLYCERIN (NITROSTAT) 0.4 MG SL tablet,  Place 1 tablet (0.4 mg total) under the tongue every 5 (five) minutes as needed for chest pain., Disp: 50 tablet, Rfl: 3 .  OVER THE COUNTER MEDICATION, Take 2 capsules by mouth 2 (two) times daily before a meal. Nature's Pearl, Disp: , Rfl:  .  sildenafil (REVATIO) 20 MG tablet, Take 1 tablet (20 mg total) by mouth 3 (three) times daily., Disp: 50 tablet, Rfl: 6 .  tamsulosin (FLOMAX) 0.4 MG CAPS capsule, TAKE ONE CAPSULE BY MOUTH EVERY DAY, Disp: 90 capsule, Rfl: 1  Social History   Tobacco Use  Smoking Status Former Smoker  . Types: Cigarettes, Pipe  . Last attempt to quit: 11/17/1968  . Years since quitting: 49.7  Smokeless Tobacco Never Used    Allergies  Allergen Reactions  . Penicillins Hives   Objective:  There were no vitals filed for this visit. There is no height or weight on file to calculate BMI. Constitutional Well developed. Well nourished.  Vascular Dorsalis pedis pulses palpable bilaterally. Posterior tibial pulses palpable bilaterally. Capillary refill normal to all digits.  No cyanosis or clubbing noted. Pedal hair growth normal.  Neurologic Normal speech. Oriented to person, place, and time. Epicritic sensation to light touch grossly present bilaterally.  Dermatologic Painful ingrowing nail at lateral nail borders of the hallux nail right. No  other open wounds. No skin lesions.  Orthopedic: Normal joint ROM without pain or crepitus bilaterally. No visible deformities. No bony tenderness.   Radiographs: None Assessment:   1. Ingrown nail   2. Pain around toenail    Plan:  Patient was evaluated and treated and all questions answered.  Ingrown Nail, right -Patient elects to proceed with minor surgery to remove ingrown toenail removal today. Consent reviewed and signed by patient. -Ingrown nail excised. See procedure note. -Educated on post-procedure care including soaking. Written instructions provided and reviewed. -Patient to follow up in 2 weeks  for nail check. -Rx Doxy for ppx. Worried about having something with him since he is traveling.  Procedure: Avulsion of Ingrown Toenail Location: Right 1st toe lateral nail borders. Anesthesia: Lidocaine 1% plain; 1.5 mL and Marcaine 0.5% plain; 1.5 mL, digital block. Skin Prep: Betadine. Dressing: Silvadene; telfa; dry, sterile, compression dressing. Technique: Following skin prep, the toe was exsanguinated and a tourniquet was secured at the base of the toe. The affected nail border was freed, split with a nail splitter, and excised. The tourniquet was then removed and sterile dressing applied. Disposition: Patient tolerated procedure well. Patient to return in 2 weeks for follow-up.   No follow-ups on file.

## 2018-08-06 NOTE — Addendum Note (Signed)
Addended by: Hardie Pulley on: 08/06/2018 02:44 PM   Modules accepted: Orders

## 2018-09-15 DIAGNOSIS — M542 Cervicalgia: Secondary | ICD-10-CM | POA: Diagnosis not present

## 2018-09-15 DIAGNOSIS — Z6829 Body mass index (BMI) 29.0-29.9, adult: Secondary | ICD-10-CM | POA: Diagnosis not present

## 2018-09-15 DIAGNOSIS — R030 Elevated blood-pressure reading, without diagnosis of hypertension: Secondary | ICD-10-CM | POA: Diagnosis not present

## 2018-11-02 DIAGNOSIS — D225 Melanocytic nevi of trunk: Secondary | ICD-10-CM | POA: Diagnosis not present

## 2018-11-02 DIAGNOSIS — L218 Other seborrheic dermatitis: Secondary | ICD-10-CM | POA: Diagnosis not present

## 2018-11-02 DIAGNOSIS — Z85828 Personal history of other malignant neoplasm of skin: Secondary | ICD-10-CM | POA: Diagnosis not present

## 2018-11-02 DIAGNOSIS — Z85820 Personal history of malignant melanoma of skin: Secondary | ICD-10-CM | POA: Diagnosis not present

## 2018-11-02 DIAGNOSIS — D1801 Hemangioma of skin and subcutaneous tissue: Secondary | ICD-10-CM | POA: Diagnosis not present

## 2018-11-02 DIAGNOSIS — L570 Actinic keratosis: Secondary | ICD-10-CM | POA: Diagnosis not present

## 2018-11-02 DIAGNOSIS — L821 Other seborrheic keratosis: Secondary | ICD-10-CM | POA: Diagnosis not present

## 2018-11-02 DIAGNOSIS — D485 Neoplasm of uncertain behavior of skin: Secondary | ICD-10-CM | POA: Diagnosis not present

## 2018-11-02 DIAGNOSIS — L814 Other melanin hyperpigmentation: Secondary | ICD-10-CM | POA: Diagnosis not present

## 2018-11-17 HISTORY — PX: CATARACT EXTRACTION, BILATERAL: SHX1313

## 2018-11-19 DIAGNOSIS — M5416 Radiculopathy, lumbar region: Secondary | ICD-10-CM | POA: Diagnosis not present

## 2018-11-19 DIAGNOSIS — M48062 Spinal stenosis, lumbar region with neurogenic claudication: Secondary | ICD-10-CM | POA: Diagnosis not present

## 2018-12-03 DIAGNOSIS — D485 Neoplasm of uncertain behavior of skin: Secondary | ICD-10-CM | POA: Diagnosis not present

## 2018-12-03 DIAGNOSIS — L570 Actinic keratosis: Secondary | ICD-10-CM | POA: Diagnosis not present

## 2018-12-03 DIAGNOSIS — L821 Other seborrheic keratosis: Secondary | ICD-10-CM | POA: Diagnosis not present

## 2018-12-06 DIAGNOSIS — M25551 Pain in right hip: Secondary | ICD-10-CM | POA: Diagnosis not present

## 2018-12-16 DIAGNOSIS — N401 Enlarged prostate with lower urinary tract symptoms: Secondary | ICD-10-CM | POA: Diagnosis not present

## 2018-12-16 DIAGNOSIS — N5201 Erectile dysfunction due to arterial insufficiency: Secondary | ICD-10-CM | POA: Diagnosis not present

## 2018-12-16 DIAGNOSIS — R3914 Feeling of incomplete bladder emptying: Secondary | ICD-10-CM | POA: Diagnosis not present

## 2018-12-17 ENCOUNTER — Encounter: Payer: Self-pay | Admitting: Family Medicine

## 2018-12-17 ENCOUNTER — Other Ambulatory Visit: Payer: Self-pay | Admitting: Urology

## 2018-12-17 ENCOUNTER — Ambulatory Visit (INDEPENDENT_AMBULATORY_CARE_PROVIDER_SITE_OTHER): Payer: Medicare HMO | Admitting: Family Medicine

## 2018-12-17 VITALS — BP 144/82 | HR 71 | Temp 97.6°F | Ht 70.0 in | Wt 203.6 lb

## 2018-12-17 DIAGNOSIS — N529 Male erectile dysfunction, unspecified: Secondary | ICD-10-CM | POA: Insufficient documentation

## 2018-12-17 DIAGNOSIS — N400 Enlarged prostate without lower urinary tract symptoms: Secondary | ICD-10-CM | POA: Diagnosis not present

## 2018-12-17 DIAGNOSIS — M545 Low back pain, unspecified: Secondary | ICD-10-CM

## 2018-12-17 DIAGNOSIS — R030 Elevated blood-pressure reading, without diagnosis of hypertension: Secondary | ICD-10-CM | POA: Diagnosis not present

## 2018-12-17 DIAGNOSIS — E039 Hypothyroidism, unspecified: Secondary | ICD-10-CM

## 2018-12-17 DIAGNOSIS — G8929 Other chronic pain: Secondary | ICD-10-CM

## 2018-12-17 DIAGNOSIS — E785 Hyperlipidemia, unspecified: Secondary | ICD-10-CM

## 2018-12-17 DIAGNOSIS — I10 Essential (primary) hypertension: Secondary | ICD-10-CM | POA: Insufficient documentation

## 2018-12-17 NOTE — Assessment & Plan Note (Signed)
Stable. Continue synthroid 147mcg daily. Check TSH with next blood draw.

## 2018-12-17 NOTE — Assessment & Plan Note (Signed)
At goal per JNC-8 guidelines. Discussed lifestyle modifications including low salt diet and regular exercise. Continue home blood pressure monitoring with goal 150/90 or lower. Discussed reasons to return to care.

## 2018-12-17 NOTE — Progress Notes (Signed)
   Chief Complaint:  Carlos David is a 73 y.o. male who presents today with a chief complaint of elevated blood pressure readings and to transfer care to this office.   Assessment/Plan:  Hypothyroidism s/p thyroidectomy 1990 due to thyroid cancer Stable. Continue synthroid 180mcg daily. Check TSH with next blood draw.   Erectile dysfunction Stable. Continue sildenafil as needed.   Elevated blood pressure reading At goal per JNC-8 guidelines. Discussed lifestyle modifications including low salt diet and regular exercise. Continue home blood pressure monitoring with goal 150/90 or lower. Discussed reasons to return to care.   Dyslipidemia Stable. Continue lipitor 20mg  daily. Check lipid panel with next blood draw.   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 No redflags. Continue management per neurosurgery.   BPH (benign prostatic hyperplasia) Stable. Continue management per urology.     Subjective:  HPI:  His stable, chronic medical conditions are outlined below:  # Elevated Blood pressure readings - Currently diet controlled - ROS: No reported chest pain or shortness of breath  # Dyslipidemia - On Lipitor 20 mg daily and tolerating well. - ROS: No report myalgias.  # Hypothyroidism - On Synthroid 137 mcg daily and tolerating well. - ROS: No reported hot or cold intolerance.   % BPH with ED - Follows with urology -On finasteride 5 mg daily, sildenafil 20mg  three times daily, and Flomax 0.4 mg daily.  Tolerating well.  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 Gets intermittent epidural steroid injections - On gabapentin 300mg  twice daily and mobic 15mg  daily  ROS: Per HPI  PMH: He reports that he quit smoking about 50 years ago. His smoking use included cigarettes and pipe. He has never used smokeless tobacco. He reports current alcohol use. He reports that he does not  use drugs.      Objective:  Physical Exam: BP (!) 144/82 (BP Location: Left Arm, Patient Position: Sitting, Cuff Size: Normal)   Pulse 71   Temp 97.6 F (36.4 C) (Oral)   Ht 5\' 10"  (1.778 m)   Wt 203 lb 9.6 oz (92.4 kg)   SpO2 98%   BMI 29.21 kg/m   Wt Readings from Last 3 Encounters:  12/17/18 203 lb 9.6 oz (92.4 kg)  06/23/18 200 lb 9.6 oz (91 kg)  06/22/17 202 lb 9.6 oz (91.9 kg)  Gen: NAD, resting comfortably CV: Regular rate and rhythm with no murmurs appreciated Pulm: Normal work of breathing, clear to auscultation bilaterally with no crackles, wheezes, or rhonchi MSK: No edema, cyanosis, or clubbing noted     Time Spent: I spent >40 minutes face-to-face with the patient, with more than half spent on counseling for management plan for his elevated BP readings, dyslipidemia, hypothyroidism.   Algis Greenhouse. Jerline Pain, MD 12/17/2018 9:19 AM

## 2018-12-17 NOTE — Assessment & Plan Note (Signed)
Stable. Continue lipitor 20mg  daily. Check lipid panel with next blood draw.

## 2018-12-17 NOTE — Assessment & Plan Note (Signed)
Stable.  Continue management per urology. 

## 2018-12-17 NOTE — Progress Notes (Signed)
This is completed. Thank you

## 2018-12-17 NOTE — Assessment & Plan Note (Signed)
Stable. No red flags. Continue management per neurosurgery.  

## 2018-12-17 NOTE — Patient Instructions (Addendum)
It was very nice to see you today!  Keep up the good work!  Keep an eye on your blood pressures and let me know if persistently 140/90 or higher.  No changes today.  Come back to see me in August for your annual check in with blood work, or sooner as needed.   Take care, Dr Jerline Pain

## 2018-12-17 NOTE — Assessment & Plan Note (Signed)
Stable. Continue sildenafil as needed. 

## 2018-12-21 ENCOUNTER — Other Ambulatory Visit: Payer: Self-pay | Admitting: Internal Medicine

## 2019-01-11 NOTE — Telephone Encounter (Signed)
Pt called in and stated that he needs a refill on this med.  He is now a pt of Dr Jerline Pain At [facility]   Pharmacy - [facility] [address]

## 2019-01-12 ENCOUNTER — Other Ambulatory Visit: Payer: Self-pay

## 2019-01-12 MED ORDER — ATORVASTATIN CALCIUM 20 MG PO TABS: 20.0000 mg | ORAL_TABLET | Freq: Every day | ORAL | 3 refills | Status: DC

## 2019-01-12 NOTE — Telephone Encounter (Signed)
See note

## 2019-01-13 DIAGNOSIS — H02831 Dermatochalasis of right upper eyelid: Secondary | ICD-10-CM | POA: Diagnosis not present

## 2019-01-13 DIAGNOSIS — H5203 Hypermetropia, bilateral: Secondary | ICD-10-CM | POA: Diagnosis not present

## 2019-01-13 DIAGNOSIS — H2513 Age-related nuclear cataract, bilateral: Secondary | ICD-10-CM | POA: Diagnosis not present

## 2019-01-13 DIAGNOSIS — H52203 Unspecified astigmatism, bilateral: Secondary | ICD-10-CM | POA: Diagnosis not present

## 2019-01-13 DIAGNOSIS — H25013 Cortical age-related cataract, bilateral: Secondary | ICD-10-CM | POA: Diagnosis not present

## 2019-01-13 DIAGNOSIS — H53002 Unspecified amblyopia, left eye: Secondary | ICD-10-CM | POA: Diagnosis not present

## 2019-01-13 DIAGNOSIS — H524 Presbyopia: Secondary | ICD-10-CM | POA: Diagnosis not present

## 2019-01-13 DIAGNOSIS — H02834 Dermatochalasis of left upper eyelid: Secondary | ICD-10-CM | POA: Diagnosis not present

## 2019-01-19 ENCOUNTER — Other Ambulatory Visit: Payer: Self-pay

## 2019-01-19 ENCOUNTER — Encounter (HOSPITAL_BASED_OUTPATIENT_CLINIC_OR_DEPARTMENT_OTHER): Payer: Self-pay | Admitting: *Deleted

## 2019-01-19 NOTE — Progress Notes (Addendum)
Spoke with Carlos David after midnight, arrive 800 am 01-21-2019 [facility] to take sip of water: gabapentin, finasteride, tamsulosin, levothyroxine Patient given ower instructions including bring all prescription medications in original containers No labs needed Has surgery orders in epic

## 2019-01-21 ENCOUNTER — Ambulatory Visit (HOSPITAL_BASED_OUTPATIENT_CLINIC_OR_DEPARTMENT_OTHER)
Admission: RE | Admit: 2019-01-21 | Discharge: 2019-01-22 | Disposition: A | Discharge status: Home / Self Care | Payer: Medicare HMO | Attending: Urology | Admitting: Urology

## 2019-01-21 ENCOUNTER — Ambulatory Visit (HOSPITAL_BASED_OUTPATIENT_CLINIC_OR_DEPARTMENT_OTHER): Payer: Medicare HMO | Admitting: Anesthesiology

## 2019-01-21 ENCOUNTER — Encounter (HOSPITAL_BASED_OUTPATIENT_CLINIC_OR_DEPARTMENT_OTHER): Payer: Self-pay | Admitting: *Deleted

## 2019-01-21 ENCOUNTER — Encounter (HOSPITAL_BASED_OUTPATIENT_CLINIC_OR_DEPARTMENT_OTHER)
Admission: RE | Disposition: A | Discharge status: Home / Self Care | Payer: Self-pay | Source: Home / Self Care | Attending: Urology

## 2019-01-21 ENCOUNTER — Other Ambulatory Visit: Payer: Self-pay

## 2019-01-21 DIAGNOSIS — N3289 Other specified disorders of bladder: Secondary | ICD-10-CM | POA: Insufficient documentation

## 2019-01-21 DIAGNOSIS — Z923 Personal history of irradiation: Secondary | ICD-10-CM | POA: Diagnosis not present

## 2019-01-21 DIAGNOSIS — Z79899 Other long term (current) drug therapy: Secondary | ICD-10-CM | POA: Diagnosis not present

## 2019-01-21 DIAGNOSIS — E890 Postprocedural hypothyroidism: Secondary | ICD-10-CM | POA: Insufficient documentation

## 2019-01-21 DIAGNOSIS — Z79890 Hormone replacement therapy (postmenopausal): Secondary | ICD-10-CM | POA: Insufficient documentation

## 2019-01-21 DIAGNOSIS — N400 Enlarged prostate without lower urinary tract symptoms: Secondary | ICD-10-CM | POA: Diagnosis not present

## 2019-01-21 DIAGNOSIS — N401 Enlarged prostate with lower urinary tract symptoms: Secondary | ICD-10-CM | POA: Diagnosis not present

## 2019-01-21 DIAGNOSIS — Z791 Long term (current) use of non-steroidal anti-inflammatories (NSAID): Secondary | ICD-10-CM | POA: Diagnosis not present

## 2019-01-21 DIAGNOSIS — E785 Hyperlipidemia, unspecified: Secondary | ICD-10-CM | POA: Insufficient documentation

## 2019-01-21 DIAGNOSIS — Z85850 Personal history of malignant neoplasm of thyroid: Secondary | ICD-10-CM | POA: Insufficient documentation

## 2019-01-21 DIAGNOSIS — R3914 Feeling of incomplete bladder emptying: Secondary | ICD-10-CM | POA: Diagnosis not present

## 2019-01-21 DIAGNOSIS — Z87891 Personal history of nicotine dependence: Secondary | ICD-10-CM | POA: Diagnosis not present

## 2019-01-21 HISTORY — DX: Unspecified cataract: H26.9

## 2019-01-21 HISTORY — DX: Personal history of irradiation: Z92.3

## 2019-01-21 HISTORY — PX: TRANSURETHRAL RESECTION OF PROSTATE: SHX73

## 2019-01-21 LAB — BASIC METABOLIC PANEL
Anion gap: 8 (ref 5–15)
BUN: 18 mg/dL (ref 8–23)
CO2: 24 mmol/L (ref 22–32)
Calcium: 9.1 mg/dL (ref 8.9–10.3)
Chloride: 106 mmol/L (ref 98–111)
Creatinine, Ser: 1 mg/dL (ref 0.61–1.24)
GFR calc Af Amer: >60 mL/min (ref >60)
Glucose, Bld: 98 mg/dL (ref 70–99)
POTASSIUM: 3.8 mmol/L (ref 3.5–5.1)
Sodium: 138 mmol/L (ref 135–145)

## 2019-01-21 SURGERY — TURP (TRANSURETHRAL RESECTION OF PROSTATE)
Anesthesia: General

## 2019-01-21 MED ORDER — FENTANYL CITRATE (PF) 100 MCG/2ML IJ SOLN
INTRAMUSCULAR | Status: AC
  Filled 2019-01-21: qty 2

## 2019-01-21 MED ORDER — GENTAMICIN SULFATE 40 MG/ML IJ SOLN
5.0000 mg/kg | INTRAVENOUS | Status: DC
  Administered 2019-01-21: 400 mg via INTRAVENOUS
  Filled 2019-01-21: qty 11.5

## 2019-01-21 MED ORDER — OXYCODONE-ACETAMINOPHEN 5-325 MG PO TABS: 1.0000 | ORAL_TABLET | Freq: Four times a day (QID) | ORAL | 0 refills | Status: DC | PRN

## 2019-01-21 MED ORDER — GABAPENTIN 300 MG PO CAPS
ORAL_CAPSULE | ORAL | Status: AC
  Filled 2019-01-21: qty 1

## 2019-01-21 MED ORDER — OXYCODONE HCL 5 MG PO TABS
5.0000 mg | ORAL_TABLET | ORAL | Status: DC | PRN
  Filled 2019-01-21: qty 1

## 2019-01-21 MED ORDER — MIDAZOLAM HCL 5 MG/5ML IJ SOLN
INTRAMUSCULAR | Status: DC | PRN
  Administered 2019-01-21: 2 mg via INTRAVENOUS

## 2019-01-21 MED ORDER — DEXAMETHASONE SODIUM PHOSPHATE 4 MG/ML IJ SOLN
INTRAMUSCULAR | Status: DC | PRN
  Administered 2019-01-21: 4 mg via INTRAVENOUS

## 2019-01-21 MED ORDER — CIPROFLOXACIN HCL 500 MG PO TABS: 500.0000 mg | ORAL_TABLET | Freq: Two times a day (BID) | ORAL | 0 refills | Status: AC

## 2019-01-21 MED ORDER — GLYCOPYRROLATE 0.2 MG/ML IJ SOLN
INTRAMUSCULAR | Status: DC | PRN
  Administered 2019-01-21: 0.2 mg via INTRAVENOUS

## 2019-01-21 MED ORDER — ACETAMINOPHEN 500 MG PO TABS
1000.0000 mg | ORAL_TABLET | Freq: Three times a day (TID) | ORAL | Status: AC
  Administered 2019-01-21 – 2019-01-22 (×3): 1000 mg via ORAL
  Filled 2019-01-21: qty 2

## 2019-01-21 MED ORDER — OXYCODONE HCL 5 MG PO TABS
5.0000 mg | ORAL_TABLET | Freq: Once | ORAL | Status: DC | PRN
  Filled 2019-01-21: qty 1

## 2019-01-21 MED ORDER — FINASTERIDE 5 MG PO TABS
5.0000 mg | ORAL_TABLET | Freq: Every day | ORAL | Status: DC
  Administered 2019-01-21: 5 mg via ORAL
  Filled 2019-01-21: qty 1

## 2019-01-21 MED ORDER — ONDANSETRON HCL 4 MG/2ML IJ SOLN
INTRAMUSCULAR | Status: AC
  Filled 2019-01-21: qty 2

## 2019-01-21 MED ORDER — SENNOSIDES-DOCUSATE SODIUM 8.6-50 MG PO TABS
1.0000 | ORAL_TABLET | Freq: Two times a day (BID) | ORAL | Status: DC
  Administered 2019-01-21 (×2): 1 via ORAL
  Filled 2019-01-21 (×3): qty 1

## 2019-01-21 MED ORDER — PROPOFOL 10 MG/ML IV BOLUS
INTRAVENOUS | Status: AC
  Filled 2019-01-21: qty 40

## 2019-01-21 MED ORDER — LIDOCAINE HCL 1 % IJ SOLN
INTRAMUSCULAR | Status: DC | PRN
  Administered 2019-01-21: 50 mg via INTRADERMAL

## 2019-01-21 MED ORDER — LACTATED RINGERS IV SOLN
INTRAVENOUS | Status: DC
  Administered 2019-01-21: 1000 mL via INTRAVENOUS
  Filled 2019-01-21: qty 1000

## 2019-01-21 MED ORDER — ACETAMINOPHEN 500 MG PO TABS
ORAL_TABLET | ORAL | Status: AC
  Filled 2019-01-21: qty 2

## 2019-01-21 MED ORDER — MIDAZOLAM HCL 2 MG/2ML IJ SOLN
INTRAMUSCULAR | Status: AC
  Filled 2019-01-21: qty 2

## 2019-01-21 MED ORDER — LEVOTHYROXINE SODIUM 137 MCG PO TABS
137.0000 ug | ORAL_TABLET | Freq: Every day | ORAL | Status: DC
  Filled 2019-01-21: qty 1

## 2019-01-21 MED ORDER — FENTANYL CITRATE (PF) 100 MCG/2ML IJ SOLN
INTRAMUSCULAR | Status: DC | PRN
  Administered 2019-01-21: 50 ug via INTRAVENOUS
  Administered 2019-01-21: 25 ug via INTRAVENOUS

## 2019-01-21 MED ORDER — GLYCOPYRROLATE PF 0.2 MG/ML IJ SOSY
PREFILLED_SYRINGE | INTRAMUSCULAR | Status: AC
  Filled 2019-01-21: qty 1

## 2019-01-21 MED ORDER — OXYCODONE HCL 5 MG/5ML PO SOLN
5.0000 mg | Freq: Once | ORAL | Status: DC | PRN
  Filled 2019-01-21: qty 5

## 2019-01-21 MED ORDER — GABAPENTIN 300 MG PO CAPS
300.0000 mg | ORAL_CAPSULE | Freq: Two times a day (BID) | ORAL | Status: DC
  Administered 2019-01-21 (×2): 300 mg via ORAL
  Filled 2019-01-21: qty 1

## 2019-01-21 MED ORDER — STERILE WATER FOR IRRIGATION IR SOLN
Status: DC | PRN
  Administered 2019-01-21: 9000 mL
  Administered 2019-01-21 (×2): 3000 mL

## 2019-01-21 MED ORDER — SODIUM CHLORIDE 0.9 % IV SOLN
INTRAVENOUS | Status: DC
  Administered 2019-01-21: 13:00:00 via INTRAVENOUS
  Filled 2019-01-21 (×2): qty 1000

## 2019-01-21 MED ORDER — ATORVASTATIN CALCIUM 20 MG PO TABS
20.0000 mg | ORAL_TABLET | Freq: Every day | ORAL | Status: DC
  Administered 2019-01-21: 20 mg via ORAL
  Filled 2019-01-21: qty 1

## 2019-01-21 MED ORDER — FENTANYL CITRATE (PF) 100 MCG/2ML IJ SOLN
25.0000 ug | INTRAMUSCULAR | Status: DC | PRN
  Administered 2019-01-21 (×2): 50 ug via INTRAVENOUS
  Filled 2019-01-21: qty 1

## 2019-01-21 MED ORDER — ONDANSETRON HCL 4 MG/2ML IJ SOLN
INTRAMUSCULAR | Status: DC | PRN
  Administered 2019-01-21: 4 mg via INTRAVENOUS

## 2019-01-21 MED ORDER — NITROGLYCERIN 0.4 MG SL SUBL
0.4000 mg | SUBLINGUAL_TABLET | SUBLINGUAL | Status: DC | PRN
  Filled 2019-01-21: qty 25

## 2019-01-21 MED ORDER — DEXAMETHASONE SODIUM PHOSPHATE 10 MG/ML IJ SOLN
INTRAMUSCULAR | Status: AC
  Filled 2019-01-21: qty 1

## 2019-01-21 MED ORDER — HYDROMORPHONE HCL 1 MG/ML IJ SOLN
0.5000 mg | INTRAMUSCULAR | Status: DC | PRN
  Filled 2019-01-21: qty 1

## 2019-01-21 MED ORDER — SODIUM CHLORIDE 0.9 % IR SOLN
3000.0000 mL | Status: DC
  Administered 2019-01-21 (×2): 3000 mL
  Filled 2019-01-21: qty 3000

## 2019-01-21 MED ORDER — GENTAMICIN SULFATE 40 MG/ML IJ SOLN
5.0000 mg/kg | INTRAVENOUS | Status: DC
  Filled 2019-01-21: qty 10

## 2019-01-21 MED ORDER — PROPOFOL 10 MG/ML IV BOLUS
INTRAVENOUS | Status: DC | PRN
  Administered 2019-01-21: 180 mg via INTRAVENOUS

## 2019-01-21 MED ORDER — SENNOSIDES-DOCUSATE SODIUM 8.6-50 MG PO TABS: 1.0000 | ORAL_TABLET | Freq: Two times a day (BID) | ORAL | 0 refills | Status: DC

## 2019-01-21 MED ORDER — ONDANSETRON HCL 4 MG/2ML IJ SOLN
4.0000 mg | Freq: Once | INTRAMUSCULAR | Status: DC | PRN
  Filled 2019-01-21: qty 2

## 2019-01-21 SURGICAL SUPPLY — 29 items
BAG DRAIN URO-CYSTO SKYTR STRL (DRAIN) ×3 IMPLANT
BAG DRN ANRFLXCHMBR STRAP LEK (BAG)
BAG DRN UROCATH (DRAIN) ×1
BAG URINE DRAINAGE (UROLOGICAL SUPPLIES) IMPLANT
BAG URINE LEG 19OZ MD ST LTX (BAG) IMPLANT
BAG URINE LEG 500ML (DRAIN) IMPLANT
CATH FOLEY 2WAY SLVR  5CC 22FR (CATHETERS)
CATH FOLEY 2WAY SLVR 30CC 20FR (CATHETERS) IMPLANT
CATH FOLEY 2WAY SLVR 5CC 22FR (CATHETERS) IMPLANT
CATH FOLEY 3WAY 30CC 22F (CATHETERS) ×2 IMPLANT
CLOTH BEACON ORANGE TIMEOUT ST (SAFETY) ×3 IMPLANT
ELECT BIVAP BIPO 22/24 DONUT (ELECTROSURGICAL)
ELECT REM PT RETURN 9FT ADLT (ELECTROSURGICAL) ×3
ELECTRD BIVAP BIPO 22/24 DONUT (ELECTROSURGICAL) IMPLANT
ELECTRODE REM PT RTRN 9FT ADLT (ELECTROSURGICAL) ×1 IMPLANT
EVACUATOR MICROVAS BLADDER (UROLOGICAL SUPPLIES) IMPLANT
GLOVE BIO SURGEON STRL SZ7.5 (GLOVE) ×3 IMPLANT
GOWN STRL REUS W/ TWL LRG LVL3 (GOWN DISPOSABLE) ×1 IMPLANT
GOWN STRL REUS W/TWL LRG LVL3 (GOWN DISPOSABLE) ×3
GUIDEWIRE STR DUAL SENSOR (WIRE) ×2 IMPLANT
IV NS IRRIG 3000ML ARTHROMATIC (IV SOLUTION) ×16 IMPLANT
KIT TURNOVER CYSTO (KITS) ×3 IMPLANT
LOOP CUT BIPOLAR 24F LRG (ELECTROSURGICAL) IMPLANT
MANIFOLD NEPTUNE II (INSTRUMENTS) IMPLANT
PACK CYSTO (CUSTOM PROCEDURE TRAY) ×3 IMPLANT
SYRINGE IRR TOOMEY STRL 70CC (SYRINGE) IMPLANT
TUBE CONNECTING 12'X1/4 (SUCTIONS)
TUBE CONNECTING 12X1/4 (SUCTIONS) IMPLANT
TUBING UROLOGY SET (TUBING) IMPLANT

## 2019-01-21 NOTE — Discharge Instructions (Signed)
1 - You may have urinary urgency (bladder spasms) and bloody urine on / off for up to 2 weeks.  This is normal.  2 - Call MD or go to ER for fever >102, severe pain / nausea / vomiting not relieved by medications, or acute change in medical status  

## 2019-01-21 NOTE — Anesthesia Preprocedure Evaluation (Addendum)
Anesthesia Evaluation  Patient identified by MRN, date of birth, ID band Patient awake    Reviewed: Allergy & Precautions, NPO status , Patient's Chart, lab work & pertinent test results  History of Anesthesia Complications Negative for: history of anesthetic complications  Airway Mallampati: II  TM Distance: >3 FB Neck ROM: Full    Dental no notable dental hx. (+) Teeth Intact   Pulmonary neg pulmonary ROS, former smoker,    Pulmonary exam normal        Cardiovascular negative cardio ROS Normal cardiovascular exam     Neuro/Psych negative neurological ROS  negative psych ROS   GI/Hepatic negative GI ROS, Neg liver ROS,   Endo/Other  Hypothyroidism   Renal/GU negative Renal ROS  negative genitourinary   Musculoskeletal negative musculoskeletal ROS (+)   Abdominal   Peds  Hematology negative hematology ROS (+)   Anesthesia Other Findings 73 yo M for TURP - HLD, hypothyroid - Stress test 11/28/16: Myocardial perfusion is normal. The study is normal. This is a low risk study. Overall left ventricular systolic function was normal. LV cavity size is normal. Nuclear stress EF: 54%. The left ventricular ejection fraction is mildly decreased (45-54%). There is no prior study for comparison.  Reproductive/Obstetrics                            Anesthesia Physical Anesthesia Plan  ASA: II  Anesthesia Plan: General   Post-op Pain Management:    Induction: Intravenous  PONV Risk Score and Plan: 2 and Ondansetron, Dexamethasone and Treatment may vary due to age or medical condition  Airway Management Planned: LMA  Additional Equipment: None  Intra-op Plan:   Post-operative Plan: Extubation in OR  Informed Consent: I have reviewed the patients History and Physical, chart, labs and discussed the procedure including the risks, benefits and alternatives for the proposed anesthesia with the  patient or authorized representative who has indicated his/her understanding and acceptance.     Dental advisory given  Plan Discussed with:   Anesthesia Plan Comments:        Anesthesia Quick Evaluation

## 2019-01-21 NOTE — Op Note (Signed)
NAME: Carlos David, Carlos David MEDICAL RECORD [id number] ACCOUNT [id number] DATE OF BIRTH:October 11, 1946 FACILITY: [facility] LOCATION: [facility] PHYSICIAN:Elyzabeth Goatley, MD  OPERATIVE REPORT  DATE OF PROCEDURE:  01/21/2019  PREOPERATIVE DIAGNOSIS:  Prostatic hypertrophy with incomplete bladder emptying.  PROCEDURE:  Transection of the prostate.  ESTIMATED BLOOD LOSS:  100 mL.  COMPLICATIONS:  None.  SPECIMENS:  Prostate chips for pathology.  FINDINGS: 1.  Significant bilobar prostatic hypertrophy pre-resection. 2.  Resolution of bilobar hypertrophy post-resection. 3.  Large capacity trabeculated urinary bladder.  INDICATIONS:  The patient is a pleasant 73 year old gentleman with a longstanding history of prostatic hypertrophy.  He has been on maximal medical therapy for a number of years.  He has had increasing symptomatic bothersome as well as increasing  residual urines for some time.  Options were discussed for further management including outlet procedure.  His prostate volume was estimated to be approximately 60-70 g in the size range.  It was felt that transurethral resection would offer the best  risk/benefit profile, and he wished to proceed.  Informed consent was obtained and placed in the medical record.  PROCEDURE IN DETAIL:  The patient being verified, the procedure being transection of the prostate was confirmed.  Procedure time-out was performed.  Intravenous antibiotics were administered.  General LMA anesthesia was induced.  The patient was placed  into a low lithotomy position.  A sterile field was created by prepping and draping his penis, perineum and proximal thighs using iodine.  Cystourethroscopy was performed with a 26-French resectoscope sheath.  Placement in the urinary bladder revealed  moderate trabeculation, large capacity.  There was bilobar prostatic hypertrophy noted with kissing lobes.  Photo documentation was performed.  Next, using resectoscope, loop transection  of the prostate was performed in a top-down orientation from the  area of the bladder neck to the area of the verumontanum, taking exquisite care not to resect distal to the verumontanum.  A 12 o'clock position was resected down.  It appeared to be the superficial fibromuscular capsule, the prostatic capsule, followed  by the right lobe of the prostate and the left of the prostate, taking exquisite care to avoid undermining the bladder neck, which did not occur grossly.  Prostate chips were irrigated and set aside for permanent pathology.  The base of the resection  area was fulgurated once again using bipolar energy.  Hemostasis appeared excellent.  There was no evidence of bladder perforation.  The resectoscope was then exchanged for a new 22-French 3-way Foley catheter over a sensor working wire to the level of  the urinary bladder, 20 mL of water in the balloon.  This was placed on light traction and normal saline irrigation.  The procedure was terminated.  The patient tolerated the procedure well.  No immediate perioperative complications.  The patient was  taken to postanesthesia care in stable condition with plan for observation overnight.  LN/NUANCE  D:01/21/2019 T:01/21/2019 JOB:[id number]

## 2019-01-21 NOTE — Anesthesia Postprocedure Evaluation (Signed)
Anesthesia Post Note  Patient: Carlos David  Procedure(s) Performed: TRANSURETHRAL RESECTION OF THE PROSTATE (TURP) (N/A )     Patient location during evaluation: PACU Anesthesia Type: General Level of consciousness: awake and alert Pain management: pain level controlled Vital Signs Assessment: post-procedure vital signs reviewed and stable Respiratory status: spontaneous breathing, nonlabored ventilation and respiratory function stable Cardiovascular status: blood pressure returned to baseline and stable Postop Assessment: no apparent nausea or vomiting Anesthetic complications: no    Last Vitals:  Vitals:   01/21/19 1230 01/21/19 1249  BP: (!) 155/86 (!) 156/88  Pulse: (!) 59 60  Resp: 15 16  Temp:  36.5 C  SpO2: 100% 97%    Last Pain:  Vitals:   01/21/19 1230  TempSrc:   PainSc: 6                  Lidia Collum

## 2019-01-21 NOTE — H&P (Signed)
Carlos David is an 73 y.o. male.    Chief Complaint: Pre-Op TURP  HPI:  1 - Lower Urinary Tract Symptoms / Large Prostate / Incomplete Emptying- Pt with minimal baseline obstrucitve / irritative LUTS. Some straining to void and urgency that is of minimal bother. No intoncinnence. Nocturia x1. MRI 01/2012 at [facility] with "large" prostate and "trabeculated" bladder, no measurments given, images not available for review. DRE 10/2012 40gm. PVR 10/2012 "487cc". No recurrent UTI or h/o renal insuficiency.    Placed on dual therapy with tamsulosin + finasteride 10/2012 and now reports dramatic subjective improvement.    Recent PVR Surveillance:  10/2016 PVR " 0 mL"  11/2017 PVR "562mL"  11/2018 PVR "721mL", cysto bilobar hypertrophy only / no strictures, DRE 70gm.   2 - Prostate Cancer Screening - No FHX prostate cancer. Had PSA based screening and DRE annually until 2017 at age 63 at which point PSA 0.29.    PMH sig for metastatic thyroid cancer, now in remission (he attributes to work in [employer] at [employer], known site of [place] [employer] experiments). His PCP is Bluford Kaufmann MD.    Today [name] is seen to proceed with TURP. Most recent UA withtou infectios parameters.     Past Medical History:  Diagnosis Date  . Actinic keratosis 12/16/2010   Qualifier: Diagnosis of  By: [name]  MD, [name]   . BPH (benign prostatic hyperplasia)   . Cancer ([place])    thyroid cancer 1990  . Cataract    both eyes  . History of radiation therapy 1990  . HYPERLIPIDEMIA 12/26/2008  . HYPOTHYROIDISM 12/26/2008  . PPD positive   . THYROID CANCER, HX OF 1990  . THYROID CANCER, HX OF 12/26/2008   Qualifier: Diagnosis of  By: [name]  MD, [name]     Past Surgical History:  Procedure Laterality Date  . COLONOSCOPY     10-ye ago-[facility] at [facility]  . EYE SURGERY     as child  . neckotomy  1990   20 lymph nodes removed from left neck  . TOTAL THYROIDECTOMY  1990    Family History   Problem Relation Age of Onset  . Colon cancer Neg Hx    Social History:  reports that he quit smoking about 50 years ago. His smoking use included cigarettes and pipe. He has never used smokeless tobacco. He reports current alcohol use. He reports that he does not use drugs.  Allergies:  Allergies  Allergen Reactions  . Penicillins Hives    No medications prior to admission.    No results found for this or any previous visit (from the past 48 hour(s)). No results found.  Review of Systems  Constitutional: Negative for chills and fever.  Genitourinary: Positive for urgency.  All other systems reviewed and are negative.   Height 5\' 10"  (1.778 m), weight 88.5 kg. Physical Exam  Constitutional: He appears well-developed.  HENT:  Head: Normocephalic.  Eyes: Pupils are equal, round, and reactive to light.  Neck: Normal range of motion.  Cardiovascular: Normal rate.  Respiratory: Effort normal.  GI: Soft.  Musculoskeletal: Normal range of motion.  Neurological: He is alert.  Skin: Skin is warm.  Psychiatric: He has a normal mood and affect.     Assessment/Plan  Proceed as planned with TURP. Risks, benefits, alternatives, expected peri-op course discussed previously and reiterated today.   [name], MD 01/21/2019, 5:39 AM

## 2019-01-21 NOTE — Anesthesia Procedure Notes (Signed)
Procedure Name: LMA Insertion Date/Time: 01/21/2019 10:17 AM Performed by: Garrel Ridgel, CRNA Pre-anesthesia Checklist: Patient identified, Emergency Drugs available, Suction available, Patient being monitored and Timeout performed Patient Re-evaluated:Patient Re-evaluated prior to induction Oxygen Delivery Method: Circle system utilized Preoxygenation: Pre-oxygenation with 100% oxygen Induction Type: IV induction Ventilation: Mask ventilation without difficulty LMA: LMA inserted LMA Size: 4.0 Number of attempts: 1 Tube secured with: Tape Dental Injury: Teeth and Oropharynx as per pre-operative assessment

## 2019-01-21 NOTE — Brief Op Note (Signed)
01/21/2019  11:00 AM  PATIENT:  Carlos David  73 y.o. male  PRE-OPERATIVE DIAGNOSIS:  PROSTATE HYPERTROPHY  POST-OPERATIVE DIAGNOSIS:  PROSTATE HYPERTROPHY  PROCEDURE:  Procedure(s) with comments: TRANSURETHRAL RESECTION OF THE PROSTATE (TURP) (N/A) - 75 MINS  SURGEON:  Surgeon(s) and Role:    * [name], MD - Primary  PHYSICIAN ASSISTANT:   ASSISTANTS: none   ANESTHESIA:   general  EBL:  5 mL   BLOOD ADMINISTERED:none  DRAINS: 25F 3 way foley to NS irrigation   LOCAL MEDICATIONS USED:  NONE  SPECIMEN:  Source of Specimen:  prostate chips  DISPOSITION OF SPECIMEN:  PATHOLOGY  COUNTS:  YES  TOURNIQUET:  * No tourniquets in log *  DICTATION: .Other Dictation: Dictation Number [id number]  PLAN OF CARE: Admit for overnight observation  PATIENT DISPOSITION:  PACU - hemodynamically stable.   Delay start of Pharmacological VTE agent (>24hrs) due to surgical blood loss or risk of bleeding: yes

## 2019-01-21 NOTE — Transfer of Care (Signed)
Immediate Anesthesia Transfer of Care Note  Patient: Carlos David  Procedure(s) Performed: TRANSURETHRAL RESECTION OF THE PROSTATE (TURP) (N/A )  Patient Location: PACU  Anesthesia Type:General  Level of Consciousness: awake, alert , oriented and patient cooperative  Airway & Oxygen Therapy: Patient Spontanous Breathing and Patient connected to face mask oxygen  Post-op Assessment: Report given to RN and Post -op Vital signs reviewed and stable  Post vital signs: Reviewed and stable  Last Vitals:  Vitals Value Taken Time  BP 144/87 01/21/2019 11:18 AM  Temp    Pulse 65 01/21/2019 11:20 AM  Resp 9 01/21/2019 11:20 AM  SpO2 100 % 01/21/2019 11:20 AM  Vitals shown include unvalidated device data.  Last Pain:  Vitals:   01/21/19 0829  TempSrc:   PainSc: 3       Patients Stated Pain Goal: 5 (03/27/01 1117)  Complications: No apparent anesthesia complications

## 2019-01-22 DIAGNOSIS — R3914 Feeling of incomplete bladder emptying: Secondary | ICD-10-CM | POA: Diagnosis not present

## 2019-01-22 DIAGNOSIS — N401 Enlarged prostate with lower urinary tract symptoms: Secondary | ICD-10-CM | POA: Diagnosis not present

## 2019-01-22 DIAGNOSIS — Z85850 Personal history of malignant neoplasm of thyroid: Secondary | ICD-10-CM | POA: Diagnosis not present

## 2019-01-22 DIAGNOSIS — Z79899 Other long term (current) drug therapy: Secondary | ICD-10-CM | POA: Diagnosis not present

## 2019-01-22 DIAGNOSIS — E890 Postprocedural hypothyroidism: Secondary | ICD-10-CM | POA: Diagnosis not present

## 2019-01-22 DIAGNOSIS — Z923 Personal history of irradiation: Secondary | ICD-10-CM | POA: Diagnosis not present

## 2019-01-22 DIAGNOSIS — N3289 Other specified disorders of bladder: Secondary | ICD-10-CM | POA: Diagnosis not present

## 2019-01-22 DIAGNOSIS — E785 Hyperlipidemia, unspecified: Secondary | ICD-10-CM | POA: Diagnosis not present

## 2019-01-22 DIAGNOSIS — Z87891 Personal history of nicotine dependence: Secondary | ICD-10-CM | POA: Diagnosis not present

## 2019-01-22 LAB — HEMOGLOBIN AND HEMATOCRIT, BLOOD
HCT: 44.1 % (ref 39.0–52.0)
HEMOGLOBIN: 13.9 g/dL (ref 13.0–17.0)

## 2019-01-22 NOTE — Discharge Summary (Signed)
Physician Discharge Summary  Patient ID: BOWEN KIA MRN: [id number] DOB/AGE: 1945/12/12 73 y.o.  Admit date: 01/21/2019 Discharge date: 01/22/2019  Admission Diagnoses:  Discharge Diagnoses:  Active Problems:   Prostatic hyperplasia   Discharged Condition: good  Hospital Course: Patient underwent a TURP on 01/21/2019.  He remained overnight on continuous bladder irrigation.  His urine was clear off CBI the following day and hemoglobin was stable.  He was discharged in stable condition with his catheter  Consults: None  Significant Diagnostic Studies: None  Treatments: surgery: TURP  Discharge Exam: Blood pressure 109/65, pulse 65, temperature 98.1 F (36.7 C), resp. rate 18, height 5\' 10"  (1.778 m), weight 91.3 kg, SpO2 95 %. No acute distress Adequate perfusion of extremities Nonlabored respirations Foley catheter in place draining clear yellow urine  Disposition: Discharge disposition: 01-Home or Self Care        Allergies as of 01/22/2019      Reactions   Penicillins Hives      Medication List    STOP taking these medications   Tylenol 8 Hour Arthritis Pain 650 MG CR tablet Generic drug:  acetaminophen     TAKE these medications   atorvastatin 20 MG tablet Commonly known as:  LIPITOR Take 1 tablet (20 mg total) by mouth daily. What changed:  when to take this   ciprofloxacin 500 MG tablet Commonly known as:  Cipro Take 1 tablet (500 mg total) by mouth 2 (two) times daily for 3 days. To prevent infection with catheter in place   finasteride 5 MG tablet Commonly known as:  PROSCAR TAKE 1 TABLET BY MOUTH EVERY DAY   gabapentin 300 MG capsule Commonly known as:  NEURONTIN Take 300 mg by mouth 2 (two) times daily.   levothyroxine 137 MCG tablet Commonly known as:  SYNTHROID, LEVOTHROID TAKE 1 TABLET BY MOUTH EVERY DAY   meloxicam 15 MG tablet Commonly known as:  MOBIC TAKE 1 TABLET (15 MG TOTAL) BY MOUTH DAILY.   nitroGLYCERIN 0.4 MG SL  tablet Commonly known as:  NITROSTAT Place 1 tablet (0.4 mg total) under the tongue every 5 (five) minutes as needed for chest pain.   OVER THE COUNTER MEDICATION Take 2 capsules by mouth every morning. Nature's Pearl   oxyCODONE-acetaminophen 5-325 MG tablet Commonly known as:  Percocet Take 1 tablet by mouth every 6 (six) hours as needed for moderate pain or severe pain. Post-operatively   senna-docusate 8.6-50 MG tablet Commonly known as:  Senokot-S Take 1 tablet by mouth 2 (two) times daily. While taking strong pain meds to prevent constipation   sildenafil 20 MG tablet Commonly known as:  REVATIO Take 1 tablet (20 mg total) by mouth 3 (three) times daily. What changed:    when to take this  reasons to take this  additional instructions   tamsulosin 0.4 MG Caps capsule Commonly known as:  FLOMAX TAKE ONE CAPSULE BY MOUTH EVERY DAY   UNABLE TO FIND Joint juice glucosamine 1500 mg daily      Follow-up Information    [name], MD On 01/24/2019.   Specialty:  Urology Why:  at 10 AM for MD visit and catheter removal Contact information: [place] [address] [phone]           Signed: Marton Redwood, III 01/22/2019, 8:32 AM

## 2019-01-24 ENCOUNTER — Encounter (HOSPITAL_BASED_OUTPATIENT_CLINIC_OR_DEPARTMENT_OTHER): Payer: Self-pay | Admitting: Urology

## 2019-01-25 DIAGNOSIS — M5416 Radiculopathy, lumbar region: Secondary | ICD-10-CM | POA: Diagnosis not present

## 2019-01-25 DIAGNOSIS — M48062 Spinal stenosis, lumbar region with neurogenic claudication: Secondary | ICD-10-CM | POA: Diagnosis not present

## 2019-01-26 DIAGNOSIS — H25812 Combined forms of age-related cataract, left eye: Secondary | ICD-10-CM | POA: Diagnosis not present

## 2019-01-26 DIAGNOSIS — H25011 Cortical age-related cataract, right eye: Secondary | ICD-10-CM | POA: Diagnosis not present

## 2019-01-26 DIAGNOSIS — H2511 Age-related nuclear cataract, right eye: Secondary | ICD-10-CM | POA: Diagnosis not present

## 2019-02-01 ENCOUNTER — Encounter: Payer: Self-pay | Admitting: Family Medicine

## 2019-02-03 ENCOUNTER — Ambulatory Visit: Payer: Medicare HMO | Admitting: Family Medicine

## 2019-02-23 DIAGNOSIS — M25551 Pain in right hip: Secondary | ICD-10-CM | POA: Diagnosis not present

## 2019-03-09 DIAGNOSIS — M48062 Spinal stenosis, lumbar region with neurogenic claudication: Secondary | ICD-10-CM | POA: Diagnosis not present

## 2019-03-09 DIAGNOSIS — M5416 Radiculopathy, lumbar region: Secondary | ICD-10-CM | POA: Diagnosis not present

## 2019-03-30 DIAGNOSIS — H2512 Age-related nuclear cataract, left eye: Secondary | ICD-10-CM | POA: Diagnosis not present

## 2019-03-30 DIAGNOSIS — H25012 Cortical age-related cataract, left eye: Secondary | ICD-10-CM | POA: Diagnosis not present

## 2019-04-25 DIAGNOSIS — R3914 Feeling of incomplete bladder emptying: Secondary | ICD-10-CM | POA: Diagnosis not present

## 2019-04-27 DIAGNOSIS — Z0100 Encounter for examination of eyes and vision without abnormal findings: Secondary | ICD-10-CM | POA: Diagnosis not present

## 2019-04-27 DIAGNOSIS — H524 Presbyopia: Secondary | ICD-10-CM | POA: Diagnosis not present

## 2019-05-05 DIAGNOSIS — Z85820 Personal history of malignant melanoma of skin: Secondary | ICD-10-CM | POA: Diagnosis not present

## 2019-05-05 DIAGNOSIS — D1801 Hemangioma of skin and subcutaneous tissue: Secondary | ICD-10-CM | POA: Diagnosis not present

## 2019-05-05 DIAGNOSIS — L570 Actinic keratosis: Secondary | ICD-10-CM | POA: Diagnosis not present

## 2019-05-05 DIAGNOSIS — D225 Melanocytic nevi of trunk: Secondary | ICD-10-CM | POA: Diagnosis not present

## 2019-05-05 DIAGNOSIS — L814 Other melanin hyperpigmentation: Secondary | ICD-10-CM | POA: Diagnosis not present

## 2019-05-05 DIAGNOSIS — L820 Inflamed seborrheic keratosis: Secondary | ICD-10-CM | POA: Diagnosis not present

## 2019-05-05 DIAGNOSIS — L821 Other seborrheic keratosis: Secondary | ICD-10-CM | POA: Diagnosis not present

## 2019-05-10 ENCOUNTER — Other Ambulatory Visit: Payer: Self-pay

## 2019-05-10 ENCOUNTER — Telehealth: Payer: Self-pay | Admitting: Family Medicine

## 2019-05-10 DIAGNOSIS — M48062 Spinal stenosis, lumbar region with neurogenic claudication: Secondary | ICD-10-CM | POA: Diagnosis not present

## 2019-05-10 DIAGNOSIS — M5416 Radiculopathy, lumbar region: Secondary | ICD-10-CM | POA: Diagnosis not present

## 2019-05-10 MED ORDER — LEVOTHYROXINE SODIUM 137 MCG PO TABS: 137.0000 ug | ORAL_TABLET | Freq: Every day | ORAL | 3 refills | Status: DC

## 2019-05-10 NOTE — Telephone Encounter (Signed)
Medication: levothyroxine (SYNTHROID, LEVOTHROID) 137 MCG tablet  Has the patient contacted their pharmacy? no  Preferred Pharmacy (with phone number or street name):  [id number] [address], [place] [phone] (Phone) [phone] (Fax)   Agent: Please be advised that RX refills may take up to 3 business days. We ask that you follow-up with your pharmacy.

## 2019-05-10 NOTE — Telephone Encounter (Signed)
Rx sent to pharmacy   

## 2019-06-06 ENCOUNTER — Encounter: Payer: Self-pay | Admitting: Family Medicine

## 2019-06-10 DIAGNOSIS — M48062 Spinal stenosis, lumbar region with neurogenic claudication: Secondary | ICD-10-CM | POA: Diagnosis not present

## 2019-06-10 DIAGNOSIS — R030 Elevated blood-pressure reading, without diagnosis of hypertension: Secondary | ICD-10-CM | POA: Diagnosis not present

## 2019-06-10 DIAGNOSIS — Z6828 Body mass index (BMI) 28.0-28.9, adult: Secondary | ICD-10-CM | POA: Diagnosis not present

## 2019-06-10 DIAGNOSIS — M5416 Radiculopathy, lumbar region: Secondary | ICD-10-CM | POA: Diagnosis not present

## 2019-06-27 ENCOUNTER — Encounter: Payer: Medicare HMO | Admitting: Family Medicine

## 2019-07-06 ENCOUNTER — Other Ambulatory Visit: Payer: Self-pay

## 2019-07-06 ENCOUNTER — Encounter: Payer: Self-pay | Admitting: Family Medicine

## 2019-07-06 ENCOUNTER — Ambulatory Visit (INDEPENDENT_AMBULATORY_CARE_PROVIDER_SITE_OTHER): Payer: Medicare HMO | Admitting: Family Medicine

## 2019-07-06 ENCOUNTER — Ambulatory Visit (INDEPENDENT_AMBULATORY_CARE_PROVIDER_SITE_OTHER): Payer: Medicare HMO

## 2019-07-06 VITALS — BP 142/78 | HR 64 | Temp 97.9°F | Ht 70.0 in | Wt 199.0 lb

## 2019-07-06 VITALS — BP 142/78 | Temp 97.9°F | Ht 70.0 in | Wt 199.0 lb

## 2019-07-06 DIAGNOSIS — M545 Low back pain, unspecified: Secondary | ICD-10-CM

## 2019-07-06 DIAGNOSIS — M159 Polyosteoarthritis, unspecified: Secondary | ICD-10-CM

## 2019-07-06 DIAGNOSIS — Z1159 Encounter for screening for other viral diseases: Secondary | ICD-10-CM

## 2019-07-06 DIAGNOSIS — E663 Overweight: Secondary | ICD-10-CM

## 2019-07-06 DIAGNOSIS — E785 Hyperlipidemia, unspecified: Secondary | ICD-10-CM | POA: Diagnosis not present

## 2019-07-06 DIAGNOSIS — M150 Primary generalized (osteo)arthritis: Secondary | ICD-10-CM | POA: Diagnosis not present

## 2019-07-06 DIAGNOSIS — Z6828 Body mass index (BMI) 28.0-28.9, adult: Secondary | ICD-10-CM

## 2019-07-06 DIAGNOSIS — N529 Male erectile dysfunction, unspecified: Secondary | ICD-10-CM | POA: Diagnosis not present

## 2019-07-06 DIAGNOSIS — Z0001 Encounter for general adult medical examination with abnormal findings: Secondary | ICD-10-CM | POA: Diagnosis not present

## 2019-07-06 DIAGNOSIS — N400 Enlarged prostate without lower urinary tract symptoms: Secondary | ICD-10-CM

## 2019-07-06 DIAGNOSIS — R5383 Other fatigue: Secondary | ICD-10-CM | POA: Diagnosis not present

## 2019-07-06 DIAGNOSIS — G8929 Other chronic pain: Secondary | ICD-10-CM

## 2019-07-06 DIAGNOSIS — R030 Elevated blood-pressure reading, without diagnosis of hypertension: Secondary | ICD-10-CM | POA: Diagnosis not present

## 2019-07-06 DIAGNOSIS — E039 Hypothyroidism, unspecified: Secondary | ICD-10-CM | POA: Diagnosis not present

## 2019-07-06 DIAGNOSIS — Z0000 Encounter for general adult medical examination without abnormal findings: Secondary | ICD-10-CM

## 2019-07-06 LAB — CBC
HCT: 42.4 % (ref 39.0–52.0)
Hemoglobin: 14.6 g/dL (ref 13.0–17.0)
MCHC: 34.4 g/dL (ref 30.0–36.0)
MCV: 90.6 fl (ref 78.0–100.0)
Platelets: 170 10*3/uL (ref 150.0–400.0)
RBC: 4.67 Mil/uL (ref 4.22–5.81)
RDW: 13 % (ref 11.5–15.5)
WBC: 6.6 10*3/uL (ref 4.0–10.5)

## 2019-07-06 LAB — COMPREHENSIVE METABOLIC PANEL
ALT: 29 U/L (ref 0–53)
AST: 35 U/L (ref 0–37)
Albumin: 4.5 g/dL (ref 3.5–5.2)
Alkaline Phosphatase: 61 U/L (ref 39–117)
BUN: 20 mg/dL (ref 6–23)
CO2: 27 mEq/L (ref 19–32)
Calcium: 9.1 mg/dL (ref 8.4–10.5)
Chloride: 103 mEq/L (ref 96–112)
Creatinine, Ser: 0.98 mg/dL (ref 0.40–1.50)
GFR: 74.69 mL/min (ref >60.00)
Glucose, Bld: 88 mg/dL (ref 70–99)
Potassium: 4.2 mEq/L (ref 3.5–5.1)
Sodium: 138 mEq/L (ref 135–145)
Total Bilirubin: 0.7 mg/dL (ref 0.2–1.2)
Total Protein: 6.7 g/dL (ref 6.0–8.3)

## 2019-07-06 LAB — LIPID PANEL
Cholesterol: 177 mg/dL (ref 0–200)
HDL: 68.6 mg/dL (ref >39.00)
LDL Cholesterol: 92 mg/dL (ref 0–99)
NonHDL: 108.12
Total CHOL/HDL Ratio: 3
Triglycerides: 83 mg/dL (ref 0.0–149.0)
VLDL: 16.6 mg/dL (ref 0.0–40.0)

## 2019-07-06 LAB — TSH: TSH: 1.61 u[IU]/mL (ref 0.35–4.50)

## 2019-07-06 LAB — VITAMIN B12: Vitamin B-12: 353 pg/mL (ref 211–911)

## 2019-07-06 NOTE — Patient Instructions (Addendum)
Carlos David , Thank you for taking time to come for your Medicare Wellness Visit. I appreciate your ongoing commitment to your health goals. Please review the following plan we discussed and let me know if I can assist you in the future.   Screening recommendations/referrals: Colorectal Screening: completed 10/02/15 with Dr. Ardis Hughs   Vision and Dental Exams: Recommended annual ophthalmology exams for early detection of glaucoma and other disorders of the eye Recommended annual dental exams for proper oral hygiene  Vaccinations: Influenza vaccine:  Pneumococcal vaccine: up to date; last 03/09/14 Tdap vaccine: up to date; last 09/20/14 Shingles vaccine: Please call your insurance company to determine your out of pocket expense for the Shingrix vaccine. You may receive this vaccine at your local pharmacy.  Advanced directives: Please bring a copy of your POA (Power of Attorney) and/or Living Will to your next appointment.  Goals:  Recommend trying the DASH diet   Next appointment: Please schedule your Annual Wellness Visit with your Nurse Health Advisor in one year.  Preventive Care 40 Years and Older, Male Preventive care refers to lifestyle choices and visits with your health care provider that can promote health and wellness. What does preventive care include?  A yearly physical exam. This is also called an annual well check.  Dental exams once or twice a year.  Routine eye exams. Ask your health care provider how often you should have your eyes checked.  Personal lifestyle choices, including:  Daily care of your teeth and gums.  Regular physical activity.  Eating a healthy diet.  Avoiding tobacco and drug use.  Limiting alcohol use.  Practicing safe sex.  Taking low doses of aspirin every day if recommended by your health care provider..  Taking vitamin and mineral supplements as recommended by your health care provider. What happens during an annual well check? The  services and screenings done by your health care provider during your annual well check will depend on your age, overall health, lifestyle risk factors, and family history of disease. Counseling  Your health care provider may ask you questions about your:  Alcohol use.  Tobacco use.  Drug use.  Emotional well-being.  Home and relationship well-being.  Sexual activity.  Eating habits.  History of falls.  Memory and ability to understand (cognition).  Work and work Statistician. Screening  You may have the following tests or measurements:  Height, weight, and BMI.  Blood pressure.  Lipid and cholesterol levels. These may be checked every 5 years, or more frequently if you are over 54 years old.  Skin check.  Lung cancer screening. You may have this screening every year starting at age 101 if you have a 30-pack-year history of smoking and currently smoke or have quit within the past 15 years.  Fecal occult blood test (FOBT) of the stool. You may have this test every year starting at age 60.  Flexible sigmoidoscopy or colonoscopy. You may have a sigmoidoscopy every 5 years or a colonoscopy every 10 years starting at age 47.  Prostate cancer screening. Recommendations will vary depending on your family history and other risks.  Hepatitis C blood test.  Hepatitis B blood test.  Sexually transmitted disease (STD) testing.  Diabetes screening. This is done by checking your blood sugar (glucose) after you have not eaten for a while (fasting). You may have this done every 1-3 years.  Abdominal aortic aneurysm (AAA) screening. You may need this if you are a current or former smoker.  Osteoporosis. You may  be screened starting at age 4 if you are at high risk. Talk with your health care provider about your test results, treatment options, and if necessary, the need for more tests. Vaccines  Your health care provider may recommend certain vaccines, such as:  Influenza  vaccine. This is recommended every year.  Tetanus, diphtheria, and acellular pertussis (Tdap, Td) vaccine. You may need a Td booster every 10 years.  Zoster vaccine. You may need this after age 73.  Pneumococcal 13-valent conjugate (PCV13) vaccine. One dose is recommended after age 59.  Pneumococcal polysaccharide (PPSV23) vaccine. One dose is recommended after age 86. Talk to your health care provider about which screenings and vaccines you need and how often you need them. This information is not intended to replace advice given to you by your health care provider. Make sure you discuss any questions you have with your health care provider. Document Released: 11/30/2015 Document Revised: 07/23/2016 Document Reviewed: 09/04/2015 Elsevier Interactive Patient Education  2017 [employer].    Managing Your Hypertension Hypertension is commonly called high blood pressure. This is when the force of your blood pressing against the walls of your arteries is too strong. Arteries are blood vessels that carry blood from your heart throughout your body. Hypertension forces the heart to work harder to pump blood, and may cause the arteries to become narrow or stiff. Having untreated or uncontrolled hypertension can cause heart attack, stroke, kidney disease, and other problems. What are blood pressure readings? A blood pressure reading consists of a higher number over a lower number. Ideally, your blood pressure should be below 120/80. The first ("top") number is called the systolic pressure. It is a measure of the pressure in your arteries as your heart beats. The second ("bottom") number is called the diastolic pressure. It is a measure of the pressure in your arteries as the heart relaxes. What does my blood pressure reading mean? Blood pressure is classified into four stages. Based on your blood pressure reading, your health care provider may use the following stages to determine what type of  treatment you need, if any. Systolic pressure and diastolic pressure are measured in a unit called mm Hg. Normal  Systolic pressure: below 229.  Diastolic pressure: below 80. Elevated  Systolic pressure: 798-921.  Diastolic pressure: below 80. Hypertension stage 1  Systolic pressure: 194-174.  Diastolic pressure: 08-14. Hypertension stage 2  Systolic pressure: 481 or above.  Diastolic pressure: 90 or above. What health risks are associated with hypertension? Managing your hypertension is an important responsibility. Uncontrolled hypertension can lead to:  A heart attack.  A stroke.  A weakened blood vessel (aneurysm).  Heart failure.  Kidney damage.  Eye damage.  Metabolic syndrome.  Memory and concentration problems. What changes can I make to manage my hypertension? Hypertension can be managed by making lifestyle changes and possibly by taking medicines. Your health care provider will help you make a plan to bring your blood pressure within a normal range. Eating and drinking   Eat a diet that is high in fiber and potassium, and low in salt (sodium), added sugar, and fat. An example eating plan is called the DASH (Dietary Approaches to Stop Hypertension) diet. To eat this way: ? Eat plenty of fresh fruits and vegetables. Try to fill half of your plate at each meal with fruits and vegetables. ? Eat whole grains, such as whole wheat pasta, brown rice, or whole grain bread. Fill about one quarter of your plate with whole  grains. ? Eat low-fat diary products. ? Avoid fatty cuts of meat, processed or cured meats, and poultry with skin. Fill about one quarter of your plate with lean proteins such as fish, chicken without skin, beans, eggs, and tofu. ? Avoid premade and processed foods. These tend to be higher in sodium, added sugar, and fat.  Reduce your daily sodium intake. Most people with hypertension should eat less than 1,500 mg of sodium a day.  Limit alcohol  intake to no more than 1 drink a day for nonpregnant women and 2 drinks a day for men. One drink equals 12 oz of beer, 5 oz of wine, or 1 oz of hard liquor. Lifestyle  Work with your health care provider to maintain a healthy body weight, or to lose weight. Ask what an ideal weight is for you.  Get at least 30 minutes of exercise that causes your heart to beat faster (aerobic exercise) most days of the week. Activities may include walking, swimming, or biking.  Include exercise to strengthen your muscles (resistance exercise), such as weight lifting, as part of your weekly exercise routine. Try to do these types of exercises for 30 minutes at least 3 days a week.  Do not use any products that contain nicotine or tobacco, such as cigarettes and e-cigarettes. If you need help quitting, ask your health care provider.  Control any long-term (chronic) conditions you have, such as high cholesterol or diabetes. Monitoring  Monitor your blood pressure at home as told by your health care provider. Your personal target blood pressure may vary depending on your medical conditions, your age, and other factors.  Have your blood pressure checked regularly, as often as told by your health care provider. Working with your health care provider  Review all the medicines you take with your health care provider because there may be side effects or interactions.  Talk with your health care provider about your diet, exercise habits, and other lifestyle factors that may be contributing to hypertension.  Visit your health care provider regularly. Your health care provider can help you create and adjust your plan for managing hypertension. Will I need medicine to control my blood pressure? Your health care provider may prescribe medicine if lifestyle changes are not enough to get your blood pressure under control, and if:  Your systolic blood pressure is 130 or higher.  Your diastolic blood pressure is 80 or  higher. Take medicines only as told by your health care provider. Follow the directions carefully. Blood pressure medicines must be taken as prescribed. The medicine does not work as well when you skip doses. Skipping doses also puts you at risk for problems. Contact a health care provider if:  You think you are having a reaction to medicines you have taken.  You have repeated (recurrent) headaches.  You feel dizzy.  You have swelling in your ankles.  You have trouble with your vision. Get help right away if:  You develop a severe headache or confusion.  You have unusual weakness or numbness, or you feel faint.  You have severe pain in your chest or abdomen.  You vomit repeatedly.  You have trouble breathing. Summary  Hypertension is when the force of blood pumping through your arteries is too strong. If this condition is not controlled, it may put you at risk for serious complications.  Your personal target blood pressure may vary depending on your medical conditions, your age, and other factors. For most people, a normal blood pressure  is less than 120/80.  Hypertension is managed by lifestyle changes, medicines, or both. Lifestyle changes include weight loss, eating a healthy, low-sodium diet, exercising more, and limiting alcohol. This information is not intended to replace advice given to you by your health care provider. Make sure you discuss any questions you have with your health care provider. Document Released: 07/28/2012 Document Revised: 02/25/2019 Document Reviewed: 10/01/2016 Elsevier Patient Education  2020 [employer].

## 2019-07-06 NOTE — Progress Notes (Signed)
Subjective:   Carlos David is a 73 y.o. male who presents for Medicare Annual/Subsequent preventive examination.  Review of Systems:   Cardiac Risk Factors include: advanced age (>78men, >64 women);dyslipidemia     Objective:    Vitals: BP (!) 142/78   Temp 97.9 F (36.6 C)   Ht 5\' 10"  (1.778 m)   Wt 199 lb (90.3 kg)   BMI 28.55 kg/m   Body mass index is 28.55 kg/m.  Advanced Directives 07/06/2019 09/06/2015 02/28/2014  Does Patient Have a Medical Advance Directive? Yes Yes Patient does not have advance directive  Type of Advance Directive [place];Living will [place];Living will -  Does patient want to make changes to medical advance directive? No - Patient declined - -  Copy of [place] in Chart? No - copy requested - -    Tobacco Social History   Tobacco Use  Smoking Status Former Smoker  . Types: Cigarettes, Pipe  . Quit date: 11/17/1968  . Years since quitting: 50.6  Smokeless Tobacco Never Used     Counseling given: Not Answered   Clinical Intake:  Pre-visit preparation completed: Yes  Pain : 0-10 Pain Score: 3  Pain Type: Chronic pain Pain Location: Back  BMI - recorded: 28.55 Nutritional Status: BMI 25 -29 Overweight Diabetes: No  How often do you need to have someone help you when you read instructions, pamphlets, or other written materials from your doctor or pharmacy?: 1 - Never  Interpreter Needed?: No  Information entered by :: Denman George LPN  Past Medical History:  Diagnosis Date  . Actinic keratosis 12/16/2010   Qualifier: Diagnosis of  By: [name]  MD, [name]   . BPH (benign prostatic hyperplasia)   . Cancer ([place])    thyroid cancer 1990  . Cataract    both eyes  . History of radiation therapy 1990  . HYPERLIPIDEMIA 12/26/2008  . HYPOTHYROIDISM 12/26/2008  . PPD positive   . THYROID CANCER, HX OF 1990  . THYROID CANCER, HX OF 12/26/2008   Qualifier: Diagnosis of   By: [name]  MD, [name]    Past Surgical History:  Procedure Laterality Date  . CATARACT EXTRACTION, BILATERAL  2020  . COLONOSCOPY     10-ye ago-normal at [facility]  . EYE SURGERY     as child  . neckotomy  1990   20 lymph nodes removed from left neck  . TOTAL THYROIDECTOMY  1990  . TRANSURETHRAL RESECTION OF PROSTATE N/A 01/21/2019   Procedure: TRANSURETHRAL RESECTION OF THE PROSTATE (TURP);  Surgeon: [name], MD;  Location: [facility];  Service: Urology;  Laterality: N/A;  70 MINS   Family History  Problem Relation Age of Onset  . Colon cancer Neg Hx    Social History   Socioeconomic History  . Marital status: Married    Spouse name: Not on file  . Number of children: Not on file  . Years of education: Not on file  . Highest education level: Not on file  Occupational History  . Not on file  Social Needs  . Financial resource strain: Not on file  . Food insecurity    Worry: Not on file    Inability: Not on file  . Transportation needs    Medical: Not on file    Non-medical: Not on file  Tobacco Use  . Smoking status: Former Smoker    Types: Cigarettes, Pipe    Quit  date: 11/17/1968    Years since quitting: 50.6  . Smokeless tobacco: Never Used  Substance and Sexual Activity  . Alcohol use: Yes    Alcohol/week: 0.0 standard drinks    Comment: occas. beer  . Drug use: No  . Sexual activity: Not on file  Lifestyle  . Physical activity    Days per week: Not on file    Minutes per session: Not on file  . Stress: Not on file  Relationships  . Social Herbalist on phone: Not on file    Gets together: Not on file    Attends religious service: Not on file    Active member of club or organization: Not on file    Attends meetings of clubs or organizations: Not on file    Relationship status: Not on file  Other Topics Concern  . Not on file  Social History Narrative  . Not on file    Outpatient Encounter Medications as of  07/06/2019  Medication Sig  . atorvastatin (LIPITOR) 20 MG tablet Take 1 tablet (20 mg total) by mouth daily. (Patient taking differently: Take 20 mg by mouth at bedtime. )  . finasteride (PROSCAR) 5 MG tablet TAKE 1 TABLET BY MOUTH EVERY DAY  . gabapentin (NEURONTIN) 300 MG capsule Take 300 mg by mouth 2 (two) times daily.  Marland Kitchen levothyroxine (SYNTHROID) 137 MCG tablet Take 1 tablet (137 mcg total) by mouth daily.  . meloxicam (MOBIC) 15 MG tablet TAKE 1 TABLET (15 MG TOTAL) BY MOUTH DAILY.  . nitroGLYCERIN (NITROSTAT) 0.4 MG SL tablet Place 1 tablet (0.4 mg total) under the tongue every 5 (five) minutes as needed for chest pain.  Marland Kitchen OVER THE COUNTER MEDICATION Take 2 capsules by mouth every morning. [place]   . sildenafil (REVATIO) 20 MG tablet Take 1 tablet (20 mg total) by mouth 3 (three) times daily. (Patient taking differently: Take 20 mg by mouth as needed. Takes 4 tabs prn)  . tamsulosin (FLOMAX) 0.4 MG CAPS capsule TAKE ONE CAPSULE BY MOUTH EVERY DAY  . UNABLE TO FIND Joint juice glucosamine 1500 mg daily   No facility-administered encounter medications on file as of 07/06/2019.     Activities of Daily Living In your present state of health, do you have any difficulty performing the following activities: 07/06/2019 01/21/2019  Hearing? N N  Vision? N N  Difficulty concentrating or making decisions? N N  Walking or climbing stairs? Y N  Comment At times due to back pain -  Dressing or bathing? N N  Doing errands, shopping? N -  Preparing Food and eating ? N -  Using the Toilet? N -  In the past six months, have you accidently leaked urine? Y -  Comment Followed by urology -  Do you have problems with loss of bowel control? N -  Managing your Medications? N -  Managing your Finances? N -  Housekeeping or managing your Housekeeping? N -  Some recent data might be hidden    Patient Care Team: Vivi Barrack, MD as PCP - General (Family Medicine) [name], MD as  Consulting Physician (Urology) Marlaine Hind, MD as Consulting Physician (Physical Medicine and Rehabilitation) Rutherford Guys, MD as Consulting Physician (Ophthalmology)   Assessment:   This is a routine wellness examination for [name].  Exercise Activities and Dietary recommendations Current Exercise Habits: Home exercise routine, Type of exercise: stretching;walking, Time (Minutes): 30, Frequency (Times/Week): 5, Weekly Exercise (Minutes/Week): 150, Intensity: Mild, Exercise  limited by: orthopedic condition(s)  Goals    . Blood Pressure < 140/90       Fall Risk Fall Risk  07/06/2019 06/17/2016 03/27/2015 03/09/2014  Falls in the past year? 0 No No No  Number falls in past yr: 0 - - -  Injury with Fall? 0 - - -  Risk for fall due to : Impaired balance/gait - - -  Follow up Education provided - - -    Timed Get Up and Go Performed: Completed and within normal timeframe   Depression Screen PHQ 2/9 Scores 07/06/2019 12/17/2018 06/17/2016 03/27/2015  PHQ - 2 Score 0 0 0 0    Cognitive Function    No cognitive concerns at this time     Immunization History  Administered Date(s) Administered  . Hep A / Hep B 05/24/2012  . Hepatitis B 06/24/2012, 12/27/2012  . Influenza,inj,Quad PF,6+ Mos 11/24/2013  . Pneumococcal Conjugate-13 03/09/2014  . Pneumococcal Polysaccharide-23 02/17/2012  . Tetanus 09/20/2014    Qualifies for Shingles Vaccine? Discussed and patient will check with pharmacy for coverage.  Patient education handout provided   Screening Tests Health Maintenance  Topic Date Due  . Hepatitis C Screening  03-12-1946  . INFLUENZA VACCINE  02/15/2020 (Originally 06/18/2019)  . COLONOSCOPY  10/01/2020  . TETANUS/TDAP  09/20/2024  . PNA vac Low Risk Adult  Completed   Cancer Screenings: Colorectal: completed 10/02/15 with Dr. Ardis Hughs       Plan:    I have personally reviewed and addressed the Medicare Annual Wellness questionnaire and have noted the following in the  patient's chart:  A. Medical and social history B. Use of alcohol, tobacco or illicit drugs  C. Current medications and supplements D. Functional ability and status E.  Nutritional status F.  Physical activity G. Advance directives H. List of other physicians I.  Hospitalizations, surgeries, and ER visits in previous 12 months J.  [place] such as hearing and vision if needed, cognitive and depression L. Referrals, records requested, and appointments-   In addition, I have reviewed and discussed with patient certain preventive protocols, quality metrics, and best practice recommendations. A written personalized care plan for preventive services as well as general preventive health recommendations were provided to patient.   Signed,  Denman George, LPN  Nurse Health Advisor   Nurse Notes: no additions

## 2019-07-06 NOTE — Assessment & Plan Note (Signed)
Continue levothyroxine 1108mcg daily. Check TSH.

## 2019-07-06 NOTE — Progress Notes (Signed)
Chief Complaint:  Carlos David is a 73 y.o. male who presents today for his annual comprehensive physical exam.    Assessment/Plan:  Dyslipidemia Continue lipitor 20mg  daily. Check lipid panel. Check CBC, CMET, and TSH.   Hypothyroidism s/p thyroidectomy 1990 due to thyroid cancer Continue levothyroxine 153mcg daily. Check TSH.   BPH s/p TURP 2020 Stable. Continue management per urology.   Erectile dysfunction Stable. Continue sildenafil as needed per urology.   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 No red flags. Continue management per neurosurgery.   Elevated blood pressure reading At goal per JNC-8.  Continue home BP monitoring with goal 150/90 or lower.   Fatigue Check CBC, cMET, TSH, and B12.  Body mass index is 28.55 kg/m. / Overweight BMI Metric Follow Up - 07/06/19 0839      BMI Metric Follow Up-Please document annually   BMI Metric Follow Up  Education provided       Preventative Healthcare: Check Hep C. Declined flu vaccine. Due for colonoscopy next year.   Patient Counseling(The following topics were reviewed and/or handout was given):  -Nutrition: Stressed importance of moderation in sodium/caffeine intake, saturated fat and cholesterol, caloric balance, sufficient intake of fresh fruits, vegetables, and fiber.  -Stressed the importance of regular exercise.   -Substance Abuse: Discussed cessation/primary prevention of tobacco, alcohol, or other drug use; driving or other dangerous activities under the influence; availability of treatment for abuse.   -Injury prevention: Discussed safety belts, safety helmets, smoke detector, smoking near bedding or upholstery.   -Sexuality: Discussed sexually transmitted diseases, partner selection, use of condoms, avoidance of unintended pregnancy and contraceptive alternatives.   -Dental health: Discussed importance of regular tooth brushing, flossing, and dental visits.  -Health  maintenance and immunizations reviewed. Please refer to Health maintenance section.  Return to care in 1 year for next preventative visit.     Subjective:  HPI:  He has no acute complaints today.   He has been having some increased fatigue over the last few weeks. No obvious precipitating events. No obvious alleviating or aggravating factors.   His stable, chronic medical conditions are outlined below:   # Elevated Blood pressure readings - Currently diet controlled - ROS: No reported chest pain or shortness of breath  # Dyslipidemia - On Lipitor 20 mg daily and tolerating well. - ROS: No report myalgias.  # Hypothyroidism - On Synthroid 137 mcg daily and tolerating well. - ROS: No reported hot or cold intolerance.   % BPH with ED s/p TURP - Follows with urology -On finasteride 5 mg daily, sildenafil 20mg  three times daily, and Flomax 0.4 mg daily.  Tolerating well.  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 - Dr Gifford Shave. Gets intermittent epidural steroid injections - On gabapentin 300mg  twice daily and mobic 15mg  daily   Lifestyle Diet: Balanced. Tries to eat plenty of fruits and vegetables.  Exercise: Very active with grandchildren. Wants to go back to the [facility].   Depression screen PHQ 2/9 07/06/2019  Decreased Interest 0  Down, Depressed, Hopeless 0  PHQ - 2 Score 0    Health Maintenance Due  Topic Date Due  . Hepatitis C Screening  February 15, 1946     ROS: Per HPI, otherwise a complete review of systems was negative.   PMH:  The following were reviewed and entered/updated in epic: Past Medical History:  Diagnosis Date  . Actinic keratosis 12/16/2010   Qualifier: Diagnosis  of  By: [name]  MD, [name]   . BPH (benign prostatic hyperplasia)   . Cancer ([place])    thyroid cancer 1990  . Cataract    both eyes  . History of radiation therapy 1990  . HYPERLIPIDEMIA 12/26/2008  . HYPOTHYROIDISM  12/26/2008  . PPD positive   . THYROID CANCER, HX OF 1990  . THYROID CANCER, HX OF 12/26/2008   Qualifier: Diagnosis of  By: [name]  MD, [name]    Patient Active Problem List   Diagnosis Date Noted  . Elevated blood pressure reading 12/17/2018  . 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 Erectile dysfunction 12/17/2018  . BPH s/p TURP 2020 03/02/2014  . Osteoarthritis 07/16/2012  . Hypothyroidism s/p thyroidectomy 1990 due to thyroid cancer 12/26/2008  . Dyslipidemia 12/26/2008   Past Surgical History:  Procedure Laterality Date  . COLONOSCOPY     10-ye ago-normal at [facility]  . EYE SURGERY     as child  . neckotomy  1990   20 lymph nodes removed from left neck  . TOTAL THYROIDECTOMY  1990  . TRANSURETHRAL RESECTION OF PROSTATE N/A 01/21/2019   Procedure: TRANSURETHRAL RESECTION OF THE PROSTATE (TURP);  Surgeon: [name], MD;  Location: [facility];  Service: Urology;  Laterality: N/A;  67 MINS    Family History  Problem Relation Age of Onset  . Colon cancer Neg Hx     Medications- reviewed and updated Current Outpatient Medications  Medication Sig Dispense Refill  . atorvastatin (LIPITOR) 20 MG tablet Take 1 tablet (20 mg total) by mouth daily. (Patient taking differently: Take 20 mg by mouth at bedtime. ) 90 tablet 3  . finasteride (PROSCAR) 5 MG tablet TAKE 1 TABLET BY MOUTH EVERY DAY 90 tablet 1  . gabapentin (NEURONTIN) 300 MG capsule Take 300 mg by mouth 2 (two) times daily.    Marland Kitchen levothyroxine (SYNTHROID) 137 MCG tablet Take 1 tablet (137 mcg total) by mouth daily. 90 tablet 3  . meloxicam (MOBIC) 15 MG tablet TAKE 1 TABLET (15 MG TOTAL) BY MOUTH DAILY. 30 tablet 0  . nitroGLYCERIN (NITROSTAT) 0.4 MG SL tablet Place 1 tablet (0.4 mg total) under the tongue every 5 (five) minutes as needed for chest pain. 50 tablet 3  . OVER THE COUNTER MEDICATION Take 2 capsules by mouth every morning. [place]     .  sildenafil (REVATIO) 20 MG tablet Take 1 tablet (20 mg total) by mouth 3 (three) times daily. (Patient taking differently: Take 20 mg by mouth as needed. Takes 4 tabs prn) 50 tablet 6  . tamsulosin (FLOMAX) 0.4 MG CAPS capsule TAKE ONE CAPSULE BY MOUTH EVERY DAY 90 capsule 1  . UNABLE TO FIND Joint juice glucosamine 1500 mg daily     No current facility-administered medications for this visit.     Allergies-reviewed and updated Allergies  Allergen Reactions  . Penicillins Hives    Social History   Socioeconomic History  . Marital status: Married    Spouse name: Not on file  . Number of children: Not on file  . Years of education: Not on file  . Highest education level: Not on file  Occupational History  . Not on file  Social Needs  . Financial resource strain: Not on file  . Food insecurity    Worry: Not on file    Inability: Not on file  . Transportation needs    Medical: Not on file  Non-medical: Not on file  Tobacco Use  . Smoking status: Former Smoker    Types: Cigarettes, Pipe    Quit date: 11/17/1968    Years since quitting: 50.6  . Smokeless tobacco: Never Used  Substance and Sexual Activity  . Alcohol use: Yes    Alcohol/week: 0.0 standard drinks    Comment: occas. beer  . Drug use: No  . Sexual activity: Not on file  Lifestyle  . Physical activity    Days per week: Not on file    Minutes per session: Not on file  . Stress: Not on file  Relationships  . Social Herbalist on phone: Not on file    Gets together: Not on file    Attends religious service: Not on file    Active member of club or organization: Not on file    Attends meetings of clubs or organizations: Not on file    Relationship status: Not on file  Other Topics Concern  . Not on file  Social History Narrative  . Not on file        Objective:  Physical Exam: BP (!) 142/78 (BP Location: Left Arm, Patient Position: Sitting, Cuff Size: Normal)   Pulse 64   Temp 97.9 F (36.6  C) (Oral)   Ht 5\' 10"  (1.778 m)   Wt 199 lb (90.3 kg)   SpO2 98%   BMI 28.55 kg/m   Body mass index is 28.55 kg/m. Wt Readings from Last 3 Encounters:  07/06/19 199 lb (90.3 kg)  01/21/19 201 lb 4.8 oz (91.3 kg)  12/17/18 203 lb 9.6 oz (92.4 kg)   Gen: NAD, resting comfortably HEENT: TMs normal bilaterally. OP clear. No thyromegaly noted.  CV: RRR with no murmurs appreciated Pulm: NWOB, CTAB with no crackles, wheezes, or rhonchi GI: Normal bowel sounds present. Soft, Nontender, Nondistended. MSK: no edema, cyanosis, or clubbing noted Skin: warm, dry Neuro: CN2-12 grossly intact. Strength 5/5 in upper and lower extremities. Reflexes symmetric and intact bilaterally.  Psych: Normal affect and thought content     Danee Soller M. Jerline Pain, MD 07/06/2019 9:18 AM

## 2019-07-06 NOTE — Assessment & Plan Note (Signed)
Stable. Continue sildenafil as needed per urology.  

## 2019-07-06 NOTE — Assessment & Plan Note (Signed)
Stable. No red flags. Continue management per neurosurgery.

## 2019-07-06 NOTE — Assessment & Plan Note (Signed)
Continue lipitor 20mg  daily. Check lipid panel. Check CBC, CMET, and TSH.

## 2019-07-06 NOTE — Patient Instructions (Signed)
It was very nice to see you today!  Keep up the good work!  We will check blood work today.  No other changes today.   Take care, Dr Jerline Pain  Please try these tips to maintain a healthy lifestyle:   Eat at least 3 REAL meals and 1-2 snacks per day.  Aim for no more than 5 hours between eating.  If you eat breakfast, please do so within one hour of getting up.    Obtain twice as many fruits/vegetables as protein or carbohydrate foods for both lunch and dinner. (Half of each meal should be fruits/vegetables, one quarter protein, and one quarter starchy carbs)   Cut down on sweet beverages. This includes juice, soda, and sweet tea.    Exercise at least 150 minutes every week.    Preventive Care 60 Years and Older, Male Preventive care refers to lifestyle choices and visits with your health care provider that can promote health and wellness. This includes:  A yearly physical exam. This is also called an annual well check.  Regular dental and eye exams.  Immunizations.  Screening for certain conditions.  Healthy lifestyle choices, such as diet and exercise. What can I expect for my preventive care visit? Physical exam Your health care provider will check:  Height and weight. These may be used to calculate body mass index (BMI), which is a measurement that tells if you are at a healthy weight.  Heart rate and blood pressure.  Your skin for abnormal spots. Counseling Your health care provider may ask you questions about:  Alcohol, tobacco, and drug use.  Emotional well-being.  Home and relationship well-being.  Sexual activity.  Eating habits.  History of falls.  Memory and ability to understand (cognition).  Work and work Statistician. What immunizations do I need?  Influenza (flu) vaccine  This is recommended every year. Tetanus, diphtheria, and pertussis (Tdap) vaccine  You may need a Td booster every 10 years. Varicella (chickenpox) vaccine  You  may need this vaccine if you have not already been vaccinated. Zoster (shingles) vaccine  You may need this after age 57. Pneumococcal conjugate (PCV13) vaccine  One dose is recommended after age 33. Pneumococcal polysaccharide (PPSV23) vaccine  One dose is recommended after age 69. Measles, mumps, and rubella (MMR) vaccine  You may need at least one dose of MMR if you were born in 1957 or later. You may also need a second dose. Meningococcal conjugate (MenACWY) vaccine  You may need this if you have certain conditions. Hepatitis A vaccine  You may need this if you have certain conditions or if you travel or work in places where you may be exposed to hepatitis A. Hepatitis B vaccine  You may need this if you have certain conditions or if you travel or work in places where you may be exposed to hepatitis B. Haemophilus influenzae type b (Hib) vaccine  You may need this if you have certain conditions. You may receive vaccines as individual doses or as more than one vaccine together in one shot (combination vaccines). Talk with your health care provider about the risks and benefits of combination vaccines. What tests do I need? Blood tests  Lipid and cholesterol levels. These may be checked every 5 years, or more frequently depending on your overall health.  Hepatitis C test.  Hepatitis B test. Screening  Lung cancer screening. You may have this screening every year starting at age 40 if you have a 30-pack-year history of smoking and currently  smoke or have quit within the past 15 years.  Colorectal cancer screening. All adults should have this screening starting at age 19 and continuing until age 54. Your health care provider may recommend screening at age 33 if you are at increased risk. You will have tests every 1-10 years, depending on your results and the type of screening test.  Prostate cancer screening. Recommendations will vary depending on your family history and other  risks.  Diabetes screening. This is done by checking your blood sugar (glucose) after you have not eaten for a while (fasting). You may have this done every 1-3 years.  Abdominal aortic aneurysm (AAA) screening. You may need this if you are a current or former smoker.  Sexually transmitted disease (STD) testing. Follow these instructions at home: Eating and drinking  Eat a diet that includes fresh fruits and vegetables, whole grains, lean protein, and low-fat dairy products. Limit your intake of foods with high amounts of sugar, saturated fats, and salt.  Take vitamin and mineral supplements as recommended by your health care provider.  Do not drink alcohol if your health care provider tells you not to drink.  If you drink alcohol: ? Limit how much you have to 0-2 drinks a day. ? Be aware of how much alcohol is in your drink. In the [place] one drink equals one 12 oz bottle of beer (355 mL), one 5 oz glass of wine (148 mL), or one 1 oz glass of hard liquor (44 mL). Lifestyle  Take daily care of your teeth and gums.  Stay active. Exercise for at least 30 minutes on 5 or more days each week.  Do not use any products that contain nicotine or tobacco, such as cigarettes, e-cigarettes, and chewing tobacco. If you need help quitting, ask your health care provider.  If you are sexually active, practice safe sex. Use a condom or other form of protection to prevent STIs (sexually transmitted infections).  Talk with your health care provider about taking a low-dose aspirin or statin. What's next?  Visit your health care provider once a year for a well check visit.  Ask your health care provider how often you should have your eyes and teeth checked.  Stay up to date on all vaccines. This information is not intended to replace advice given to you by your health care provider. Make sure you discuss any questions you have with your health care provider. Document Released: 11/30/2015 Document  Revised: 10/28/2018 Document Reviewed: 10/28/2018 Elsevier Patient Education  2020 [employer].

## 2019-07-06 NOTE — Assessment & Plan Note (Signed)
Stable.  Continue management per urology. 

## 2019-07-06 NOTE — Assessment & Plan Note (Signed)
At goal per JNC-8.  Continue home BP monitoring with goal 150/90 or lower.

## 2019-07-06 NOTE — Progress Notes (Signed)
I have personally reviewed the Medicare Annual Wellness Visit and agree with the assessment and plan.  Algis Greenhouse. Jerline Pain, MD 07/06/2019 10:09 AM

## 2019-07-07 LAB — HEPATITIS C ANTIBODY
Hepatitis C Ab: NONREACTIVE
SIGNAL TO CUT-OFF: 0.01 (ref <1.00)

## 2019-07-11 NOTE — Progress Notes (Signed)
Please inform patient of the following:  Blood work is all NORMAL. Do not need to make any changes to his treatment plan at this time. Would like for him to keep up the good work and we can recheck in a year or so.  Carlos David. Jerline Pain, MD 07/11/2019 9:45 AM

## 2019-08-05 DIAGNOSIS — Z961 Presence of intraocular lens: Secondary | ICD-10-CM | POA: Diagnosis not present

## 2019-08-17 DIAGNOSIS — M5416 Radiculopathy, lumbar region: Secondary | ICD-10-CM | POA: Diagnosis not present

## 2019-08-17 DIAGNOSIS — M48062 Spinal stenosis, lumbar region with neurogenic claudication: Secondary | ICD-10-CM | POA: Diagnosis not present

## 2019-11-28 ENCOUNTER — Telehealth: Payer: Self-pay | Admitting: Family Medicine

## 2019-11-28 NOTE — Telephone Encounter (Signed)
Ok to refill.  Algis Greenhouse. Jerline Pain, MD 11/28/2019 2:55 PM

## 2019-11-28 NOTE — Telephone Encounter (Signed)
.   LAST APPOINTMENT DATE: @@LASTENCT @  NEXT APPOINTMENT DATE:@Visit  date not found  MEDICATION:sildenafil (REVATIO) 20 MG tablet  PHARMACY: [place] [place] Phone:  [phone]  Fax:  [phone]       Patient would like 100 sent in for him Dr. Raliegh Ip use to prescribe it for him.   **Let patient know to contact pharmacy at the end of the day to make sure medication is ready. **  ** Please notify patient to allow 48-72 hours to process**  **Encourage patient to contact the pharmacy for refills or they can request refills through [facility]**  CLINICAL FILLS OUT ALL BELOW:   LAST REFILL:  QTY:  REFILL DATE:    OTHER COMMENTS:    Okay for refill?  Please advise

## 2019-11-28 NOTE — Telephone Encounter (Signed)
Rx request 

## 2019-11-29 ENCOUNTER — Other Ambulatory Visit: Payer: Self-pay

## 2019-11-29 DIAGNOSIS — Z85828 Personal history of other malignant neoplasm of skin: Secondary | ICD-10-CM | POA: Diagnosis not present

## 2019-11-29 DIAGNOSIS — L905 Scar conditions and fibrosis of skin: Secondary | ICD-10-CM | POA: Diagnosis not present

## 2019-11-29 DIAGNOSIS — D225 Melanocytic nevi of trunk: Secondary | ICD-10-CM | POA: Diagnosis not present

## 2019-11-29 DIAGNOSIS — Z85820 Personal history of malignant melanoma of skin: Secondary | ICD-10-CM | POA: Diagnosis not present

## 2019-11-29 DIAGNOSIS — L821 Other seborrheic keratosis: Secondary | ICD-10-CM | POA: Diagnosis not present

## 2019-11-29 DIAGNOSIS — L570 Actinic keratosis: Secondary | ICD-10-CM | POA: Diagnosis not present

## 2019-11-29 DIAGNOSIS — L814 Other melanin hyperpigmentation: Secondary | ICD-10-CM | POA: Diagnosis not present

## 2019-11-29 DIAGNOSIS — D1801 Hemangioma of skin and subcutaneous tissue: Secondary | ICD-10-CM | POA: Diagnosis not present

## 2019-11-29 DIAGNOSIS — Z872 Personal history of diseases of the skin and subcutaneous tissue: Secondary | ICD-10-CM | POA: Diagnosis not present

## 2019-11-29 MED ORDER — SILDENAFIL CITRATE 20 MG PO TABS: 20.0000 mg | ORAL_TABLET | Freq: Three times a day (TID) | ORAL | 1 refills | Status: AC | PRN

## 2019-11-29 MED ORDER — SILDENAFIL CITRATE 20 MG PO TABS: 20.0000 mg | ORAL_TABLET | Freq: Three times a day (TID) | ORAL | 1 refills | Status: DC | PRN

## 2019-11-29 NOTE — Telephone Encounter (Signed)
Rx sent 

## 2019-12-03 ENCOUNTER — Encounter: Payer: Self-pay | Admitting: Family Medicine

## 2019-12-17 ENCOUNTER — Ambulatory Visit: Payer: Medicare HMO

## 2019-12-18 ENCOUNTER — Ambulatory Visit: Payer: Medicare HMO

## 2019-12-22 ENCOUNTER — Ambulatory Visit: Payer: Medicare HMO

## 2019-12-25 ENCOUNTER — Ambulatory Visit: Payer: Medicare HMO | Attending: Internal Medicine

## 2019-12-25 DIAGNOSIS — Z23 Encounter for immunization: Secondary | ICD-10-CM

## 2019-12-25 NOTE — Progress Notes (Signed)
   Covid-19 Vaccination Clinic  Name:  Carlos David    MRN: [id number] DOB: Jan 04, 1946  12/25/2019  Carlos David was observed post Covid-19 immunization for 15 minutes without incidence. He was provided with Vaccine Information Sheet and instruction to access the V-Safe system.   Carlos David was instructed to call 911 with any severe reactions post vaccine: Marland Kitchen Difficulty breathing  . Swelling of your face and throat  . A fast heartbeat  . A bad rash all over your body  . Dizziness and weakness    Immunizations Administered    Name Date Dose VIS Date Route   Pfizer COVID-19 Vaccine 12/25/2019  8:29 AM 0.3 mL 10/28/2019 Intramuscular   Manufacturer: [place]   Lot: [id number]   [place]: [id number]

## 2019-12-26 ENCOUNTER — Other Ambulatory Visit: Payer: Self-pay | Admitting: Family Medicine

## 2020-01-18 ENCOUNTER — Ambulatory Visit (INDEPENDENT_AMBULATORY_CARE_PROVIDER_SITE_OTHER): Payer: Medicare HMO | Admitting: Physician Assistant

## 2020-01-18 ENCOUNTER — Telehealth: Payer: Self-pay | Admitting: *Deleted

## 2020-01-18 ENCOUNTER — Other Ambulatory Visit: Payer: Self-pay

## 2020-01-18 ENCOUNTER — Encounter: Payer: Self-pay | Admitting: Physician Assistant

## 2020-01-18 VITALS — BP 150/90 | HR 72 | Temp 98.1°F | Ht 70.0 in | Wt 208.4 lb

## 2020-01-18 DIAGNOSIS — R079 Chest pain, unspecified: Secondary | ICD-10-CM

## 2020-01-18 LAB — COMPREHENSIVE METABOLIC PANEL
ALT: 26 U/L (ref 0–53)
AST: 32 U/L (ref 0–37)
Albumin: 4.3 g/dL (ref 3.5–5.2)
Alkaline Phosphatase: 70 U/L (ref 39–117)
BUN: 21 mg/dL (ref 6–23)
CO2: 27 mEq/L (ref 19–32)
Calcium: 9.5 mg/dL (ref 8.4–10.5)
Chloride: 102 mEq/L (ref 96–112)
Creatinine, Ser: 1.02 mg/dL (ref 0.40–1.50)
GFR: 71.21 mL/min (ref >60.00)
Glucose, Bld: 88 mg/dL (ref 70–99)
Potassium: 4.4 mEq/L (ref 3.5–5.1)
Sodium: 137 mEq/L (ref 135–145)
Total Bilirubin: 0.6 mg/dL (ref 0.2–1.2)
Total Protein: 6.9 g/dL (ref 6.0–8.3)

## 2020-01-18 LAB — CBC WITH DIFFERENTIAL/PLATELET
Basophils Absolute: 0 10*3/uL (ref 0.0–0.1)
Basophils Relative: 0.5 % (ref 0.0–3.0)
Eosinophils Absolute: 0.2 10*3/uL (ref 0.0–0.7)
Eosinophils Relative: 2.7 % (ref 0.0–5.0)
HCT: 41.9 % (ref 39.0–52.0)
Hemoglobin: 14.3 g/dL (ref 13.0–17.0)
Lymphocytes Relative: 25.2 % (ref 12.0–46.0)
Lymphs Abs: 2 10*3/uL (ref 0.7–4.0)
MCHC: 34.2 g/dL (ref 30.0–36.0)
MCV: 88.4 fl (ref 78.0–100.0)
Monocytes Absolute: 0.8 10*3/uL (ref 0.1–1.0)
Monocytes Relative: 10.1 % (ref 3.0–12.0)
Neutro Abs: 4.8 10*3/uL (ref 1.4–7.7)
Neutrophils Relative %: 61.5 % (ref 43.0–77.0)
Platelets: 194 10*3/uL (ref 150.0–400.0)
RBC: 4.74 Mil/uL (ref 4.22–5.81)
RDW: 13.6 % (ref 11.5–15.5)
WBC: 7.8 10*3/uL (ref 4.0–10.5)

## 2020-01-18 LAB — TSH: TSH: 1.73 u[IU]/mL (ref 0.35–4.50)

## 2020-01-18 NOTE — Telephone Encounter (Signed)
Left message on voicemail to call office. Needs to be Triaged if calls back prior to appt.

## 2020-01-18 NOTE — Progress Notes (Signed)
Carlos David is a 74 y.o. male here for a follow up of a pre-existing problem.  I acted as a Education administrator for [employer], PA-C [employer], LPN  History of Present Illness:   Chief Complaint  Patient presents with  . pinching feeling left chest    HPI   L chest discomfort Back in 2018-2019, had a prior surgery at the bottom of L breast area for atypical skin lesion. Pt c/o pinching in this same left chest area x 6 months off and on.  Denies chest pain with exertion, SOB. Denies numbness or tingling. [facility], will do elliptical for 30 min and not have any exertional symptoms.   Had a similar episode in 2018 when seeing his PCP, Dr. [name]. At that time, had same time of "soreness and achiness in the L upper anterior chest wall area". At that time had normal EKG and was ordered for stress test which was essentially normal.  Current cardiac risk factors include:  History of elevated cholesterol -- currently controlled. Elevated blood pressure reading.  BP Readings from Last 5 Encounters:  01/18/20 (!) 150/90  07/06/19 (!) 142/78  07/06/19 (!) 142/78  01/22/19 112/71  12/17/18 (!) 144/82     Past Medical History:  Diagnosis Date  . Actinic keratosis 12/16/2010   Qualifier: Diagnosis of  By: [name]  MD, [name]   . BPH (benign prostatic hyperplasia)   . Cancer ([place])    thyroid cancer 1990  . Cataract    both eyes  . History of radiation therapy 1990  . HYPERLIPIDEMIA 12/26/2008  . HYPOTHYROIDISM 12/26/2008  . PPD positive   . THYROID CANCER, HX OF 1990  . THYROID CANCER, HX OF 12/26/2008   Qualifier: Diagnosis of  By: [name]  MD, [name]      Social History   Socioeconomic History  . Marital status: Married    Spouse name: Not on file  . Number of children: Not on file  . Years of education: Not on file  . Highest education level: Not on file  Occupational History  . Not on file  Tobacco Use  . Smoking status:  Former Smoker    Types: Cigarettes, Pipe    Quit date: 11/17/1968    Years since quitting: 51.2  . Smokeless tobacco: Never Used  Substance and Sexual Activity  . Alcohol use: Yes    Alcohol/week: 0.0 standard drinks    Comment: occas. beer  . Drug use: No  . Sexual activity: Not on file  Other Topics Concern  . Not on file  Social History Narrative  . Not on file   Social Determinants of Health   Financial Resource Strain:   . Difficulty of Paying Living Expenses: Not on file  Food Insecurity:   . Worried About Charity fundraiser in the Last Year: Not on file  . Ran Out of Food in the Last Year: Not on file  Transportation Needs:   . Lack of Transportation (Medical): Not on file  . Lack of Transportation (Non-Medical): Not on file  Physical Activity:   . Days of Exercise per Week: Not on file  . Minutes of Exercise per Session: Not on file  Stress:   . Feeling of Stress : Not on file  Social Connections:   . Frequency of Communication with Friends and Family: Not on file  . Frequency of Social Gatherings with Friends and Family: Not on file  .  Attends Religious Services: Not on file  . Active Member of Clubs or Organizations: Not on file  . Attends Archivist Meetings: Not on file  . Marital Status: Not on file  Intimate Partner Violence:   . Fear of Current or Ex-Partner: Not on file  . Emotionally Abused: Not on file  . Physically Abused: Not on file  . Sexually Abused: Not on file    Past Surgical History:  Procedure Laterality Date  . CATARACT EXTRACTION, BILATERAL  2020  . COLONOSCOPY     10-ye ago-normal at [facility]  . EYE SURGERY     as child  . neckotomy  1990   20 lymph nodes removed from left neck  . TOTAL THYROIDECTOMY  1990  . TRANSURETHRAL RESECTION OF PROSTATE N/A 01/21/2019   Procedure: TRANSURETHRAL RESECTION OF THE PROSTATE (TURP);  Surgeon: [name], MD;  Location: [facility];  Service: Urology;  Laterality: N/A;   57 MINS    Family History  Problem Relation Age of Onset  . Colon cancer Neg Hx     Allergies  Allergen Reactions  . Penicillins Hives    Current Medications:   Current Outpatient Medications:  .  atorvastatin (LIPITOR) 20 MG tablet, TAKE 1 TABLET BY MOUTH EVERY DAY, Disp: 90 tablet, Rfl: 3 .  finasteride (PROSCAR) 5 MG tablet, TAKE 1 TABLET BY MOUTH EVERY DAY, Disp: 90 tablet, Rfl: 1 .  gabapentin (NEURONTIN) 300 MG capsule, Take 300 mg by mouth 2 (two) times daily., Disp: , Rfl:  .  levothyroxine (SYNTHROID) 137 MCG tablet, Take 1 tablet (137 mcg total) by mouth daily., Disp: 90 tablet, Rfl: 3 .  meloxicam (MOBIC) 15 MG tablet, TAKE 1 TABLET (15 MG TOTAL) BY MOUTH DAILY., Disp: 30 tablet, Rfl: 0 .  nitroGLYCERIN (NITROSTAT) 0.4 MG SL tablet, Place 1 tablet (0.4 mg total) under the tongue every 5 (five) minutes as needed for chest pain., Disp: 50 tablet, Rfl: 3 .  OVER THE COUNTER MEDICATION, Take 2 capsules by mouth every morning. [place] , Disp: , Rfl:  .  sildenafil (REVATIO) 20 MG tablet, Take 1 tablet (20 mg total) by mouth 3 (three) times daily as needed., Disp: 90 tablet, Rfl: 1 .  UNABLE TO FIND, Joint juice glucosamine 1500 mg daily, Disp: , Rfl:    Review of Systems:   ROS  Negative unless otherwise specified per HPI.  Vitals:   Vitals:   01/18/20 1402  BP: (!) 150/90  Pulse: 72  Temp: 98.1 F (36.7 C)  TempSrc: Temporal  SpO2: 96%  Weight: 208 lb 6.1 oz (94.5 kg)  Height: 5\' 10"  (1.778 m)     Body mass index is 29.9 kg/m.  Physical Exam:   Physical Exam Vitals and nursing note reviewed.  Constitutional:      General: He is not in acute distress.    Appearance: He is well-developed. He is not ill-appearing or toxic-appearing.  Cardiovascular:     Rate and Rhythm: Normal rate and regular rhythm.     Pulses: Normal pulses.     Heart sounds: Normal heart sounds, S1 normal and S2 normal.     Comments: No LE edema Pulmonary:     Effort:  Pulmonary effort is normal.     Breath sounds: Normal breath sounds.  Chest:     Comments: Scar to bottom of L chest with slight TTP -- no erythema Skin:    General: Skin is warm and dry.  Neurological:  Mental Status: He is alert.     GCS: GCS eye subscore is 4. GCS verbal subscore is 5. GCS motor subscore is 6.  Psychiatric:        Speech: Speech normal.        Behavior: Behavior normal. Behavior is cooperative.       Assessment and Plan:   Er[place] was seen today for pinching feeling left chest.  Diagnoses and all orders for this visit:  Chest pain, unspecified type -     EKG 12-Lead -     Ambulatory referral to Cardiology -     TSH -     CBC with Differential/Platelet -     Comprehensive metabolic panel   EKG tracing is personally reviewed.  EKG notes NSR.  No acute changes. No exertional component however will refer to cardiology for further reassurance per patient request. Labs updated today. Low threshold to go to the ER in the interim if any changes or worsening symptoms.  . Reviewed expectations re: course of current medical issues. . Discussed self-management of symptoms. . Outlined signs and symptoms indicating need for more acute intervention. . Patient verbalized understanding and all questions were answered. . See orders for this visit as documented in the electronic medical record. . Patient received an After-Visit Summary.  CMA or LPN served as scribe during this visit. History, Physical, and Plan performed by medical provider. The above documentation has been reviewed and is accurate and complete.   [name], PA-C

## 2020-01-18 NOTE — Patient Instructions (Signed)
It was great to see you!  You will be contacted about your referral to cardiology.    Nonspecific Chest Pain Chest pain can be caused by many different conditions. Some causes of chest pain can be life-threatening. These will require treatment right away. Serious causes of chest pain include:  Heart attack.  A tear in the body's main blood vessel.  Redness and swelling (inflammation) around your heart.  Blood clot in your lungs. Other causes of chest pain may not be so serious. These include:  Heartburn.  Anxiety or stress.  Damage to bones or muscles in your chest.  Lung infections. Chest pain can feel like:  Pain or discomfort in your chest.  Crushing, pressure, aching, or squeezing pain.  Burning or tingling.  Dull or sharp pain that is worse when you move, cough, or take a deep breath.  Pain or discomfort that is also felt in your back, neck, jaw, shoulder, or arm, or pain that spreads to any of these areas. It is hard to know whether your pain is caused by something that is serious or something that is not so serious. So it is important to see your doctor right away if you have chest pain. Follow these instructions at home: Medicines  Take over-the-counter and prescription medicines only as told by your doctor.  If you were prescribed an antibiotic medicine, take it as told by your doctor. Do not stop taking the antibiotic even if you start to feel better. Lifestyle   Rest as told by your doctor.  Do not use any products that contain nicotine or tobacco, such as cigarettes, e-cigarettes, and chewing tobacco. If you need help quitting, ask your doctor.  Do not drink alcohol.  Make lifestyle changes as told by your doctor. These may include: ? Getting regular exercise. Ask your doctor what activities are safe for you. ? Eating a heart-healthy diet. A diet and nutrition specialist (dietitian) can help you to learn healthy eating options. ? Staying at a healthy  weight. ? Treating diabetes or high blood pressure, if needed. ? Lowering your stress. Activities such as yoga and relaxation techniques can help. General instructions  Pay attention to any changes in your symptoms. Tell your doctor about them or any new symptoms.  Avoid any activities that cause chest pain.  Keep all follow-up visits as told by your doctor. This is important. You may need more testing if your chest pain does not go away. Contact a doctor if:  Your chest pain does not go away.  You feel depressed.  You have a fever. Get help right away if:  Your chest pain is worse.  You have a cough that gets worse, or you cough up blood.  You have very bad (severe) pain in your belly (abdomen).  You pass out (faint).  You have either of these for no clear reason: ? Sudden chest discomfort. ? Sudden discomfort in your arms, back, neck, or jaw.  You have shortness of breath at any time.  You suddenly start to sweat, or your skin gets clammy.  You feel sick to your stomach (nauseous).  You throw up (vomit).  You suddenly feel lightheaded or dizzy.  You feel very weak or tired.  Your heart starts to beat fast, or it feels like it is skipping beats. These symptoms may be an emergency. Do not wait to see if the symptoms will go away. Get medical help right away. Call your local emergency services (911 in the [place]  Do not drive yourself to the hospital. Summary  Chest pain can be caused by many different conditions. The cause may be serious and need treatment right away. If you have chest pain, see your doctor right away.  Follow your doctor's instructions for taking medicines and making lifestyle changes.  Keep all follow-up visits as told by your doctor. This includes visits for any further testing if your chest pain does not go away.  Be sure to know the signs that show that your condition has become worse. Get help right away if you have these symptoms. This  information is not intended to replace advice given to you by your health care provider. Make sure you discuss any questions you have with your health care provider. Document Revised: 05/06/2018 Document Reviewed: 05/06/2018 Elsevier Patient Education  2020 [employer].

## 2020-01-19 ENCOUNTER — Ambulatory Visit: Payer: Medicare HMO | Admitting: Cardiology

## 2020-01-19 ENCOUNTER — Encounter: Payer: Self-pay | Admitting: Cardiology

## 2020-01-19 ENCOUNTER — Ambulatory Visit: Payer: Medicare HMO | Attending: Internal Medicine

## 2020-01-19 VITALS — BP 150/90 | HR 63 | Temp 97.2°F | Ht 70.0 in | Wt 209.0 lb

## 2020-01-19 DIAGNOSIS — E785 Hyperlipidemia, unspecified: Secondary | ICD-10-CM | POA: Diagnosis not present

## 2020-01-19 DIAGNOSIS — R079 Chest pain, unspecified: Secondary | ICD-10-CM

## 2020-01-19 DIAGNOSIS — Z23 Encounter for immunization: Secondary | ICD-10-CM | POA: Insufficient documentation

## 2020-01-19 DIAGNOSIS — I10 Essential (primary) hypertension: Secondary | ICD-10-CM | POA: Diagnosis not present

## 2020-01-19 MED ORDER — LOSARTAN POTASSIUM 50 MG PO TABS: 50.0000 mg | ORAL_TABLET | Freq: Every day | ORAL | 3 refills | Status: DC

## 2020-01-19 MED ORDER — METOPROLOL TARTRATE 50 MG PO TABS: ORAL_TABLET | ORAL | 0 refills | Status: DC

## 2020-01-19 NOTE — Patient Instructions (Signed)
Medication Instructions:  START Losartan 50 mg daily  *If you need a refill on your cardiac medications before your next appointment, please call your pharmacy*   Lab Work: BMET IN 1 WEEK  If you have labs (blood work) drawn today and your tests are completely normal, you will receive your results only by: Marland Kitchen MyChart Message (if you have MyChart) OR . A paper copy in the mail If you have any lab test that is abnormal or we need to change your treatment, we will call you to review the results.   Testing/Procedures: Your physician has requested that you have an echocardiogram. Echocardiography is a painless test that uses sound waves to create images of your heart. It provides your doctor with information about the size and shape of your heart and how well your heart's chambers and valves are working. This procedure takes approximately one hour. There are no restrictions for this procedure.  This will be done at our [facility] location:  [place] has requested that you have cardiac CT. Cardiac computed tomography (CT) is a painless test that uses an x-ray machine to take clear, detailed pictures of your heart. For further information please visit HugeFiesta.[email or web address]. Please follow instruction sheet as given.   Follow-Up: At [facility], you and your health needs are our priority.  As part of our continuing mission to provide you with exceptional heart care, we have created designated Provider Care Teams.  These Care Teams include your primary Cardiologist (physician) and Advanced Practice Providers (APPs -  Physician Assistants and Nurse Practitioners) who all work together to provide you with the care you need, when you need it.  We recommend signing up for the patient portal called "MyChart".  Sign up information is provided on this After Visit Summary.  MyChart is used to connect with patients for Virtual Visits (Telemedicine).  Patients are able  to view lab/test results, encounter notes, upcoming appointments, etc.  Non-urgent messages can be sent to your provider as well.   To learn more about what you can do with MyChart, go to [email or web address].    Your next appointment:   3 month(s)  The format for your next appointment:   In Person  Provider:   Oswaldo Milian, MD

## 2020-01-19 NOTE — Progress Notes (Signed)
   Covid-19 Vaccination Clinic  Name:  Carlos David    MRN: [id number] DOB: May 01, 1946  01/19/2020  [name] was observed post Covid-19 immunization for 15 minutes without incident. He was provided with Vaccine Information Sheet and instruction to access the V-Safe system.   [name] was instructed to call 911 with any severe reactions post vaccine: Marland Kitchen Difficulty breathing  . Swelling of face and throat  . A fast heartbeat  . A bad rash all over body  . Dizziness and weakness   Immunizations Administered    Name Date Dose VIS Date Route   Pfizer COVID-19 Vaccine 01/19/2020  8:33 AM 0.3 mL 10/28/2019 Intramuscular   Manufacturer: [place]   Lot: [id number]   [place]: [id number]

## 2020-01-19 NOTE — Progress Notes (Signed)
Cardiology Office Note:    Date:  01/19/2020   ID:  KNIGHTON GEYMAN, DOB January 05, 1946, MRN [id number]  PCP:  Vivi Barrack, MD  Cardiologist:  No primary care provider on file.  Electrophysiologist:  None   Referring MD: Vivi Barrack, MD   Chief Complaint  Patient presents with  . Chest Pain    History of Present Illness:    Carlos David is a 74 y.o. male with a hx of thyroid cancer now hypothyroidism, hyperlipidemia who is referred by [name], PA-C for evaluation of chest pain.    He reports that he has been having chest pain for the last few months.  Occurs daily.  Chest pain is located under his left breast.  Describes as pinching pain.  Can last for few minutes.  Typically occurs at rest.  Reports that he exercises at the [facility], will do the elliptical for 30 minutes.  Has not noted exertional chest pain.  Does report some lightheadedness with standing.  Denies any syncope, palpitations, lower extremity edema.  Smoked cigarettes as teenager for few years.  Smoked pipe for 10 years.  Mother had MI in 2s.     Past Medical History:  Diagnosis Date  . Actinic keratosis 12/16/2010   Qualifier: Diagnosis of  By: [name]  MD, [name]   . BPH (benign prostatic hyperplasia)   . Cancer ([place])    thyroid cancer 1990  . Cataract    both eyes  . History of radiation therapy 1990  . HYPERLIPIDEMIA 12/26/2008  . HYPOTHYROIDISM 12/26/2008  . PPD positive   . THYROID CANCER, HX OF 1990  . THYROID CANCER, HX OF 12/26/2008   Qualifier: Diagnosis of  By: [name]  MD, [name]     Past Surgical History:  Procedure Laterality Date  . CATARACT EXTRACTION, BILATERAL  2020  . COLONOSCOPY     10-ye ago-normal at [facility]  . EYE SURGERY     as child  . neckotomy  1990   20 lymph nodes removed from left neck  . TOTAL THYROIDECTOMY  1990  . TRANSURETHRAL RESECTION OF PROSTATE N/A 01/21/2019   Procedure: TRANSURETHRAL RESECTION OF THE PROSTATE (TURP);  Surgeon: [name], MD;   Location: [facility];  Service: Urology;  Laterality: N/A;  74 MINS    Current Medications: No outpatient medications have been marked as taking for the 01/19/20 encounter (Office Visit) with Donato Heinz, MD.     Allergies:   Penicillins   Social History   Socioeconomic History  . Marital status: Married    Spouse name: Not on file  . Number of children: Not on file  . Years of education: Not on file  . Highest education level: Not on file  Occupational History  . Not on file  Tobacco Use  . Smoking status: Former Smoker    Types: Cigarettes, Pipe    Quit date: 11/17/1968    Years since quitting: 51.2  . Smokeless tobacco: Never Used  Substance and Sexual Activity  . Alcohol use: Yes    Alcohol/week: 0.0 standard drinks    Comment: occas. beer  . Drug use: No  . Sexual activity: Not on file  Other Topics Concern  . Not on file  Social History Narrative  . Not on file   Social Determinants of Health   Financial Resource Strain:   . Difficulty of Paying Living Expenses: Not on file  Food Insecurity:   . Worried About Running  Out of Food in the Last Year: Not on file  . Ran Out of Food in the Last Year: Not on file  Transportation Needs:   . Lack of Transportation (Medical): Not on file  . Lack of Transportation (Non-Medical): Not on file  Physical Activity:   . Days of Exercise per Week: Not on file  . Minutes of Exercise per Session: Not on file  Stress:   . Feeling of Stress : Not on file  Social Connections:   . Frequency of Communication with Friends and Family: Not on file  . Frequency of Social Gatherings with Friends and Family: Not on file  . Attends Religious Services: Not on file  . Active Member of Clubs or Organizations: Not on file  . Attends Archivist Meetings: Not on file  . Marital Status: Not on file     Family History: The patient's family history is negative for Colon cancer.  ROS:   Please see the  history of present illness.     All other systems reviewed and are negative.  EKGs/Labs/Other Studies Reviewed:    The following studies were reviewed today:   EKG:  EKG is  ordered today.  The ekg ordered today demonstrates normal sinus rhythm, rate 63, left axis deviation, no ST/T abnormalities  Lexiscan Myoview 11/28/16:  Nuclear stress EF: 54%.  The study is normal.  This is a low risk study.  The left ventricular ejection fraction is mildly decreased (45-54%).  There was no ST segment deviation noted during stress.   Normal resting and stress perfusion. No ischemia or infarction EF 54%   Recent Labs: 01/18/2020: ALT 26; BUN 21; Creatinine, Ser 1.02; Hemoglobin 14.3; Platelets 194.0; Potassium 4.4; Sodium 137; TSH 1.73  Recent Lipid Panel    Component Value Date/Time   CHOL 177 07/06/2019 0859   TRIG 83.0 07/06/2019 0859   HDL 68.60 07/06/2019 0859   CHOLHDL 3 07/06/2019 0859   VLDL 16.6 07/06/2019 0859   LDLCALC 92 07/06/2019 0859    Physical Exam:    VS:  BP (!) 150/90   Pulse 63   Temp (!) 97.2 F (36.2 C)   Ht 5\' 10"  (1.778 m)   Wt 209 lb (94.8 kg)   SpO2 98%   BMI 29.99 kg/m     Wt Readings from Last 3 Encounters:  01/19/20 209 lb (94.8 kg)  01/18/20 208 lb 6.1 oz (94.5 kg)  07/06/19 199 lb (90.3 kg)     GEN:  Well nourished, well developed in no acute distress HEENT: Normal NECK: No JVD; No carotid bruits LYMPHATICS: No lymphadenopathy CARDIAC: RRR, no murmurs, rubs, gallops RESPIRATORY:  Clear to auscultation without rales, wheezing or rhonchi  ABDOMEN: Soft, non-tender, non-distended MUSCULOSKELETAL:  No edema; No deformity  SKIN: Warm and dry NEUROLOGIC:  Alert and oriented x 3 PSYCHIATRIC:  Normal affect   ASSESSMENT:    1. Chest pain, unspecified type   2. Essential hypertension   3. Hyperlipidemia, unspecified hyperlipidemia type    PLAN:    In order of problems listed above:  Chest pain: Atypical in description, but given  risk factors (age, hyperlipidemia, hypertension, tobacco use) would classify as intermediate risk of obstructive coronary disease and warrants further evaluation -Coronary CTA -TTE  Hypertension: No prior diagnosis, but BPs appear elevated at recent clinic visits.  He reports when he checks at home BP is often over 140s over 90s. -Will start losartan 50 mg daily.  Check BMET in 1 week -Asked  patient to check blood pressure daily and call next 2 weeks with results  Hyperlipidemia: on atorvastatin 20 mg.  LDL 92 07/06/19  RTC in 3 months  Medication Adjustments/Labs and Tests Ordered: Current medicines are reviewed at length with the patient today.  Concerns regarding medicines are outlined above.  Orders Placed This Encounter  Procedures  . CT CORONARY MORPH W/CTA COR W/SCORE W/CA W/CM &/OR WO/CM  . CT CORONARY FRACTIONAL FLOW RESERVE DATA PREP  . CT CORONARY FRACTIONAL FLOW RESERVE FLUID ANALYSIS  . Basic metabolic panel  . EKG 12-Lead  . ECHOCARDIOGRAM COMPLETE   Meds ordered this encounter  Medications  . metoprolol tartrate (LOPRESSOR) 50 MG tablet    Sig: Take 50 mg (1 tablet) TWO hours prior to CT    Dispense:  1 tablet    Refill:  0  . losartan (COZAAR) 50 MG tablet    Sig: Take 1 tablet (50 mg total) by mouth daily.    Dispense:  90 tablet    Refill:  3    Patient Instructions  Medication Instructions:  START Losartan 50 mg daily  *If you need a refill on your cardiac medications before your next appointment, please call your pharmacy*   Lab Work: BMET IN 1 WEEK  If you have labs (blood work) drawn today and your tests are completely normal, you will receive your results only by: Marland Kitchen MyChart Message (if you have MyChart) OR . A paper copy in the mail If you have any lab test that is abnormal or we need to change your treatment, we will call you to review the results.   Testing/Procedures: Your physician has requested that you have an echocardiogram.  Echocardiography is a painless test that uses sound waves to create images of your heart. It provides your doctor with information about the size and shape of your heart and how well your heart's chambers and valves are working. This procedure takes approximately one hour. There are no restrictions for this procedure.  This will be done at our [facility] location:  [place] has requested that you have cardiac CT. Cardiac computed tomography (CT) is a painless test that uses an x-ray machine to take clear, detailed pictures of your heart. For further information please visit [email or web address]. Please follow instruction sheet as given.   Follow-Up: At [facility], you and your health needs are our priority.  As part of our continuing mission to provide you with exceptional heart care, we have created designated Provider Care Teams.  These Care Teams include your primary Cardiologist (physician) and Advanced Practice Providers (APPs -  Physician Assistants and Nurse Practitioners) who all work together to provide you with the care you need, when you need it.  We recommend signing up for the patient portal called "MyChart".  Sign up information is provided on this After Visit Summary.  MyChart is used to connect with patients for Virtual Visits (Telemedicine).  Patients are able to view lab/test results, encounter notes, upcoming appointments, etc.  Non-urgent messages can be sent to your provider as well.   To learn more about what you can do with MyChart, go to [email or web address].    Your next appointment:   3 month(s)  The format for your next appointment:   In Person  Provider:   Oswaldo Milian, MD       Signed, Donato Heinz, MD  01/19/2020 5:01 PM    [place]

## 2020-01-24 DIAGNOSIS — M48062 Spinal stenosis, lumbar region with neurogenic claudication: Secondary | ICD-10-CM | POA: Diagnosis not present

## 2020-01-24 DIAGNOSIS — M5416 Radiculopathy, lumbar region: Secondary | ICD-10-CM | POA: Diagnosis not present

## 2020-01-25 DIAGNOSIS — R079 Chest pain, unspecified: Secondary | ICD-10-CM | POA: Diagnosis not present

## 2020-01-25 LAB — BASIC METABOLIC PANEL
BUN/Creatinine Ratio: 23 (ref 10–24)
BUN: 27 mg/dL (ref 8–27)
CO2: 18 mmol/L — ABNORMAL LOW (ref 20–29)
Calcium: 9.1 mg/dL (ref 8.6–10.2)
Chloride: 100 mmol/L (ref 96–106)
Creatinine, Ser: 1.17 mg/dL (ref 0.76–1.27)
GFR calc Af Amer: 71 mL/min/{1.73_m2} (ref >59)
GFR calc non Af Amer: 61 mL/min/{1.73_m2} (ref >59)
Glucose: 82 mg/dL (ref 65–99)
Potassium: 4.6 mmol/L (ref 3.5–5.2)
Sodium: 136 mmol/L (ref 134–144)

## 2020-02-06 ENCOUNTER — Ambulatory Visit (HOSPITAL_COMMUNITY): Payer: Medicare HMO | Attending: Cardiovascular Disease

## 2020-02-06 ENCOUNTER — Other Ambulatory Visit: Payer: Self-pay

## 2020-02-06 DIAGNOSIS — R079 Chest pain, unspecified: Secondary | ICD-10-CM | POA: Diagnosis not present

## 2020-02-16 ENCOUNTER — Ambulatory Visit: Payer: Medicare HMO | Admitting: Family Medicine

## 2020-02-21 ENCOUNTER — Encounter: Payer: Self-pay | Admitting: Family Medicine

## 2020-02-22 DIAGNOSIS — M25561 Pain in right knee: Secondary | ICD-10-CM | POA: Diagnosis not present

## 2020-02-28 ENCOUNTER — Telehealth (HOSPITAL_COMMUNITY): Payer: Self-pay | Admitting: Emergency Medicine

## 2020-02-28 ENCOUNTER — Encounter (HOSPITAL_COMMUNITY): Payer: Self-pay

## 2020-02-28 NOTE — Telephone Encounter (Signed)
Attempted to call patient regarding upcoming cardiac CT appointment. °Left message on voicemail with name and callback number °Kissa Campoy RN Navigator Cardiac Imaging °[facility] °[phone] Office °[phone] Cell ° °

## 2020-02-29 ENCOUNTER — Ambulatory Visit (HOSPITAL_COMMUNITY): Payer: Medicare HMO

## 2020-03-02 DIAGNOSIS — M48062 Spinal stenosis, lumbar region with neurogenic claudication: Secondary | ICD-10-CM | POA: Diagnosis not present

## 2020-03-02 DIAGNOSIS — M5416 Radiculopathy, lumbar region: Secondary | ICD-10-CM | POA: Diagnosis not present

## 2020-03-06 DIAGNOSIS — G5602 Carpal tunnel syndrome, left upper limb: Secondary | ICD-10-CM | POA: Diagnosis not present

## 2020-03-06 DIAGNOSIS — G5600 Carpal tunnel syndrome, unspecified upper limb: Secondary | ICD-10-CM | POA: Insufficient documentation

## 2020-03-13 ENCOUNTER — Other Ambulatory Visit: Payer: Self-pay | Admitting: *Deleted

## 2020-03-13 ENCOUNTER — Telehealth (HOSPITAL_COMMUNITY): Payer: Self-pay | Admitting: Emergency Medicine

## 2020-03-13 ENCOUNTER — Other Ambulatory Visit: Payer: Self-pay

## 2020-03-13 DIAGNOSIS — Z01812 Encounter for preprocedural laboratory examination: Secondary | ICD-10-CM

## 2020-03-13 DIAGNOSIS — R079 Chest pain, unspecified: Secondary | ICD-10-CM

## 2020-03-13 DIAGNOSIS — I10 Essential (primary) hypertension: Secondary | ICD-10-CM

## 2020-03-13 NOTE — Progress Notes (Signed)
Pre procedure BMP order placed per protocol.

## 2020-03-13 NOTE — Telephone Encounter (Signed)
error 

## 2020-03-14 ENCOUNTER — Other Ambulatory Visit: Payer: Self-pay

## 2020-03-14 ENCOUNTER — Other Ambulatory Visit: Payer: Medicare HMO | Admitting: *Deleted

## 2020-03-14 ENCOUNTER — Other Ambulatory Visit: Payer: Self-pay | Admitting: Cardiology

## 2020-03-14 ENCOUNTER — Telehealth (HOSPITAL_COMMUNITY): Payer: Self-pay | Admitting: Emergency Medicine

## 2020-03-14 DIAGNOSIS — G5602 Carpal tunnel syndrome, left upper limb: Secondary | ICD-10-CM | POA: Diagnosis not present

## 2020-03-14 DIAGNOSIS — M48062 Spinal stenosis, lumbar region with neurogenic claudication: Secondary | ICD-10-CM | POA: Diagnosis not present

## 2020-03-14 DIAGNOSIS — I10 Essential (primary) hypertension: Secondary | ICD-10-CM | POA: Diagnosis not present

## 2020-03-14 DIAGNOSIS — M4316 Spondylolisthesis, lumbar region: Secondary | ICD-10-CM | POA: Diagnosis not present

## 2020-03-14 DIAGNOSIS — Z01812 Encounter for preprocedural laboratory examination: Secondary | ICD-10-CM | POA: Diagnosis not present

## 2020-03-14 NOTE — Telephone Encounter (Signed)
Attempted to call patient regarding upcoming cardiac CT appointment. °Left message on voicemail with name and callback number °Vyncent Overby RN Navigator Cardiac Imaging °[facility] °[phone] Office °[phone] Cell ° °

## 2020-03-15 ENCOUNTER — Ambulatory Visit (HOSPITAL_COMMUNITY)
Admission: RE | Admit: 2020-03-15 | Discharge: 2020-03-15 | Disposition: A | Discharge status: Home / Self Care | Payer: Medicare HMO | Source: Ambulatory Visit | Attending: Cardiology | Admitting: Cardiology

## 2020-03-15 DIAGNOSIS — I2510 Atherosclerotic heart disease of native coronary artery without angina pectoris: Secondary | ICD-10-CM | POA: Diagnosis not present

## 2020-03-15 DIAGNOSIS — R079 Chest pain, unspecified: Secondary | ICD-10-CM | POA: Insufficient documentation

## 2020-03-15 DIAGNOSIS — I712 Thoracic aortic aneurysm, without rupture: Secondary | ICD-10-CM | POA: Diagnosis not present

## 2020-03-15 DIAGNOSIS — G5602 Carpal tunnel syndrome, left upper limb: Secondary | ICD-10-CM | POA: Diagnosis not present

## 2020-03-15 LAB — POCT I-STAT CREATININE: Creatinine, Ser: 1 mg/dL (ref 0.61–1.24)

## 2020-03-15 MED ORDER — NITROGLYCERIN 0.4 MG SL SUBL
SUBLINGUAL_TABLET | SUBLINGUAL | Status: AC
  Administered 2020-03-15: 0.8 mg via SUBLINGUAL
  Filled 2020-03-15: qty 2

## 2020-03-15 MED ORDER — IOHEXOL 350 MG/ML SOLN
80.0000 mL | Freq: Once | INTRAVENOUS | Status: AC | PRN
  Administered 2020-03-15: 80 mL via INTRAVENOUS

## 2020-03-15 MED ORDER — NITROGLYCERIN 0.4 MG SL SUBL: 0.8000 mg | SUBLINGUAL_TABLET | Freq: Once | SUBLINGUAL | Status: AC

## 2020-03-16 DIAGNOSIS — I2510 Atherosclerotic heart disease of native coronary artery without angina pectoris: Secondary | ICD-10-CM | POA: Diagnosis not present

## 2020-03-16 LAB — BASIC METABOLIC PANEL
BUN/Creatinine Ratio: 25 — ABNORMAL HIGH (ref 10–24)
BUN: 25 mg/dL (ref 8–27)
CO2: 19 mmol/L — ABNORMAL LOW (ref 20–29)
Calcium: 8.8 mg/dL (ref 8.6–10.2)
Chloride: 106 mmol/L (ref 96–106)
Creatinine, Ser: 1.02 mg/dL (ref 0.76–1.27)
GFR calc Af Amer: 83 mL/min/{1.73_m2} (ref >59)
GFR calc non Af Amer: 72 mL/min/{1.73_m2} (ref >59)
Glucose: 96 mg/dL (ref 65–99)
Potassium: 4.5 mmol/L (ref 3.5–5.2)
Sodium: 138 mmol/L (ref 134–144)

## 2020-03-20 ENCOUNTER — Other Ambulatory Visit: Payer: Self-pay | Admitting: *Deleted

## 2020-03-20 DIAGNOSIS — I712 Thoracic aortic aneurysm, without rupture, unspecified: Secondary | ICD-10-CM

## 2020-03-20 MED ORDER — ATORVASTATIN CALCIUM 40 MG PO TABS: 40.0000 mg | ORAL_TABLET | Freq: Every day | ORAL | 3 refills | Status: DC

## 2020-04-10 ENCOUNTER — Other Ambulatory Visit: Payer: Self-pay | Admitting: Family Medicine

## 2020-04-22 NOTE — Progress Notes (Signed)
Cardiology Office Note:    Date:  04/23/2020   ID:  Carlos David, DOB [birth year], MRN [id number]  PCP:  Vivi Barrack, MD  Cardiologist:  No primary care provider on file.  Electrophysiologist:  None   Referring MD: Vivi Barrack, MD   Chief Complaint  Patient presents with   Chest Pain    History of Present Illness:    Carlos David is a 74 y.o. male with a hx of thyroid cancer now hypothyroidism, hyperlipidemia who presents for follow-up.  He was referred by Carlos Coke, PA-C for evaluation of chest pain, initially seen on 01/19/2020.  He reported that he has been having chest pain for the preceding few months.  Occured daily.  Chest pain is located under his left breast.  Describes as pinching pain.  Can last for few minutes.  Typically occurs at rest.  Reports that he exercises at the [facility], will do the elliptical for 30 minutes.  Has not noted exertional chest pain.  Does report some lightheadedness with standing.  Denies any syncope, palpitations, lower extremity edema.  Smoked cigarettes as teenager for few years.  Smoked pipe for 10 years.  Mother had MI in 54s.    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  Since last clinic visit, reports he continues to have intermittent chest pain but no more than once per month.  States that it is around the site where he had a mole removed 5 years ago.  Pain lasts about 1 minute and resolves.  He exercised at the [facility] for the first time in 3 weeks today.  Did the elliptical for 30 minutes.  Denies any exertional chest pain.  Checks BP daily, reports has been 110s to 130s     Past Medical History:  Diagnosis Date   Actinic  keratosis 12/16/2010   Qualifier: Diagnosis of  By: [name]  MD, [name]    BPH (benign prostatic hyperplasia)    Cancer [facility])    thyroid cancer 1990   Cataract    both eyes   History of radiation therapy 1990   HYPERLIPIDEMIA 12/26/2008   HYPOTHYROIDISM 12/26/2008   PPD positive    THYROID CANCER, HX OF 1990   THYROID CANCER, HX OF 12/26/2008   Qualifier: Diagnosis of  By: [name]  MD, [name]     Past Surgical History:  Procedure Laterality Date   CATARACT EXTRACTION, BILATERAL  2020   COLONOSCOPY     10-ye ago-normal at [facility]   [place]     as child   neckotomy  1990   20 lymph nodes removed from left neck   TOTAL THYROIDECTOMY  1990   TRANSURETHRAL RESECTION OF PROSTATE N/A 01/21/2019   Procedure: TRANSURETHRAL RESECTION OF THE PROSTATE (TURP);  Surgeon: [name], MD;  Location: [facility];  Service: Urology;  Laterality: N/A;  75 MINS    Current Medications: Current Meds  Medication Sig   atorvastatin (LIPITOR) 40 MG tablet Take 1 tablet (40 mg total) by mouth daily.   finasteride (PROSCAR) 5 MG tablet TAKE 1 TABLET BY MOUTH EVERY DAY   gabapentin (NEURONTIN) 300 MG capsule Take 300 mg by mouth 2 (two) times daily.  levothyroxine (SYNTHROID) 137 MCG tablet TAKE 1 TABLET BY MOUTH EVERY DAY   meloxicam (MOBIC) 15 MG tablet TAKE 1 TABLET (15 MG TOTAL) BY MOUTH DAILY.   metoprolol tartrate (LOPRESSOR) 50 MG tablet Take 50 mg (1 tablet) TWO hours prior to CT   nitroGLYCERIN (NITROSTAT) 0.4 MG SL tablet Place 1 tablet (0.4 mg total) under the tongue every 5 (five) minutes as needed for chest pain.   OVER THE COUNTER MEDICATION Take 2 capsules by mouth every morning. Nature's Pearl    UNABLE TO FIND Joint juice glucosamine 1500 mg daily     Allergies:   Penicillins   Social History   Socioeconomic History   Marital status: Married    Spouse name: Not on file   Number of children: Not on file   Years of education:  Not on file   Highest education level: Not on file  Occupational History   Not on file  Tobacco Use   Smoking status: Former Smoker    Types: Cigarettes, Pipe    Quit date: 11/17/1968    Years since quitting: 51.4   Smokeless tobacco: Never Used  Substance and Sexual Activity   Alcohol use: Yes    Alcohol/week: 0.0 standard drinks    Comment: occas. beer   Drug use: No   Sexual activity: Not on file  Other Topics Concern   Not on file  Social History Narrative   Not on file   Social Determinants of Health   Financial Resource Strain:    Difficulty of Paying Living Expenses:   Food Insecurity:    Worried About Burns in the Last Year:    Arboriculturist in the Last Year:   Transportation Needs:    Film/video editor (Medical):    Lack of Transportation (Non-Medical):   Physical Activity:    Days of Exercise per Week:    Minutes of Exercise per Session:   Stress:    Feeling of Stress :   Social Connections:    Frequency of Communication with Friends and Family:    Frequency of Social Gatherings with Friends and Family:    Attends Religious Services:    Active Member of Clubs or Organizations:    Attends Archivist Meetings:    Marital Status:      Family History: The patient's family history is negative for Colon cancer.  ROS:   Please see the history of present illness.     All other systems reviewed and are negative.  EKGs/Labs/Other Studies Reviewed:    The following studies were reviewed today:   EKG:  EKG is not ordered today.  The ekg ordered most recetnly demonstrates normal sinus rhythm, rate 63, left axis deviation, no ST/T abnormalities  Lexiscan Myoview 11/28/16:  Nuclear stress EF: 54%.  The study is normal.  This is a low risk study.  The left ventricular ejection fraction is mildly decreased (45-54%).  There was no ST segment deviation noted during stress.   Normal resting and stress  perfusion. No ischemia or infarction EF 54%  Coronary CTA 03/15/20: IMPRESSION: 1. Coronary calcium score of 526. This was 23 percentile for age and sex matched control.  2. Normal coronary origin with right dominance.  3. CAD-RADS 3. Mild stenosis in the proximal and moderate stenosis in the mid LAD. Consider symptom-guided anti-ischemic pharmacotherapy as well as risk factor modification per guideline directed care. Additional analysis with CT FFR will be submitted.  4. 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 No dissection.  5. Mildly dilated pulmonary artery measuring 33 mm.  1. Left Main: 0.99.  2. LAD: Proximal: 0.96, mid: 0.86, distal: 0.83. 3. LCX: 0.94 4. OM1: 0.96. 5. OM2: 9.97. 6. RCA: Proximal: 0.98, distal: 0.96.  IMPRESSION: 1. CT FFR analysis didn't show any significant stenosis. Aggressive medical management is recommended.   IMPRESSION: 1.  No acute findings in the imaged extracardiac chest. 2.  Aortic Atherosclerosis (ICD10-I70.0).  TTE 02/06/20: 1. Left ventricular ejection fraction, by estimation, is 60 to 65%. The  left ventricle has normal function. The left ventricle has no regional  wall motion abnormalities. There is mild asymmetric left ventricular  hypertrophy. Left ventricular diastolic  parameters are indeterminate.  2. Right ventricular systolic function is normal. The right ventricular  size is normal.  3. The mitral valve is normal in structure. Mild mitral valve  regurgitation. No evidence of mitral stenosis.  4. The aortic valve is normal in structure. Aortic valve regurgitation is  not visualized. No aortic stenosis is present.  5. Aortic dilatation noted. There is mild to moderate dilatation of the  ascending aorta measuring 44 mm.   Recent Labs: 01/18/2020: ALT 26; Hemoglobin 14.3; Platelets 194.0; TSH 1.73 03/14/2020: BUN 25; Potassium 4.5; Sodium 138 03/15/2020: Creatinine, Ser 1.00  Recent Lipid  Panel    Component Value Date/Time   CHOL 177 07/06/2019 0859   TRIG 83.0 07/06/2019 0859   HDL 68.60 07/06/2019 0859   CHOLHDL 3 07/06/2019 0859   VLDL 16.6 07/06/2019 0859   LDLCALC 92 07/06/2019 0859    Physical Exam:    VS:  BP 125/73    Pulse 75    Temp (!) 97.2 F (36.2 C)    Ht 5\' 10"  (1.778 m)    Wt 204 lb 3.2 oz (92.6 kg)    SpO2 91%    BMI 29.30 kg/m     Wt Readings from Last 3 Encounters:  04/23/20 204 lb 3.2 oz (92.6 kg)  01/19/20 209 lb (94.8 kg)  01/18/20 208 lb 6.1 oz (94.5 kg)     GEN:  Well nourished, well developed in no acute distress HEENT: Normal NECK: No JVD; No carotid bruits LYMPHATICS: No lymphadenopathy CARDIAC: RRR, no murmurs, rubs, gallops RESPIRATORY:  Clear to auscultation without rales, wheezing or rhonchi  ABDOMEN: Soft, non-tender, non-distended MUSCULOSKELETAL:  No edema; No deformity  SKIN: Warm and dry NEUROLOGIC:  Alert and oriented x 3 PSYCHIATRIC:  Normal affect   ASSESSMENT:    1. CAD in native artery   2. Hyperlipidemia, unspecified hyperlipidemia type   3. Essential hypertension   4. Ectatic aorta (HCC)    PLAN:    In order of problems listed above:  CAD: Presented with atypical chest pain.  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 -Continue atorvastatin 40 mg daily.  Hypertension: No prior diagnosis, but BPs appear elevated at recent clinic visits.  Started losartan 50 mg daily on 01/19/2020, appears controlled in clinic today  Hyperlipidemia:  LDL 92 07/06/19 on atorvastatin 20 mg daily, increased to 40 mg daily on 03/20/2020.  Will plan to check fasting lipid panel in 1 to 2 months to ensure LDL at goal less than 70  Aortic dilatation: ascending aorta measures 1mm on CTA.  Repeat CTA in 1 year  RTC in 6  months  Medication Adjustments/Labs and  Tests Ordered: Current medicines are reviewed at length with the patient today.  Concerns regarding medicines are outlined above.  Orders Placed This Encounter  Procedures   Lipid panel   No orders of the defined types were placed in this encounter.   Patient Instructions  Medication Instructions:  Your physician recommends that you continue on your current medications as directed. Please refer to the Current Medication list given to you today.  *If you need a refill on your cardiac medications before your next appointment, please call your pharmacy*   Lab Work: Please return for FASTING labs in August (Lipid) --I will mail you lab orders closer to date as a reminder  Our in office lab hours are Monday-Friday 8:00-4:00, closed for lunch 12:45-1:45 pm.  No appointment needed.  If you have labs (blood work) drawn today and your tests are completely normal, you will receive your results only by:  [place] (if you have MyChart) OR  A paper copy in the mail If you have any lab test that is abnormal or we need to change your treatment, we will call you to review the results.  Follow-Up: At [facility], you and your health needs are our priority.  As part of our continuing mission to provide you with exceptional heart care, we have created designated Provider Care Teams.  These Care Teams include your primary Cardiologist (physician) and Advanced Practice Providers (APPs -  Physician Assistants and Nurse Practitioners) who all work together to provide you with the care you need, when you need it.  We recommend signing up for the patient portal called "MyChart".  Sign up information is provided on this After Visit Summary.  MyChart is used to connect with patients for Virtual Visits (Telemedicine).  Patients are able to view lab/test results, encounter notes, upcoming appointments, etc.  Non-urgent messages can be sent to your provider as well.   To learn more about what you can  do with MyChart, go to [email or web address].    Your next appointment:   6 month(s)  The format for your next appointment:   In Person  Provider:   Oswaldo Milian, MD      Signed, Donato Heinz, MD  04/23/2020 5:56 PM    [place]

## 2020-04-23 ENCOUNTER — Encounter: Payer: Self-pay | Admitting: Cardiology

## 2020-04-23 ENCOUNTER — Ambulatory Visit: Payer: Medicare HMO | Admitting: Cardiology

## 2020-04-23 ENCOUNTER — Other Ambulatory Visit: Payer: Self-pay

## 2020-04-23 VITALS — BP 125/73 | HR 75 | Temp 97.2°F | Ht 70.0 in | Wt 204.2 lb

## 2020-04-23 DIAGNOSIS — I10 Essential (primary) hypertension: Secondary | ICD-10-CM

## 2020-04-23 DIAGNOSIS — I2510 Atherosclerotic heart disease of native coronary artery without angina pectoris: Secondary | ICD-10-CM

## 2020-04-23 DIAGNOSIS — I77819 Aortic ectasia, unspecified site: Secondary | ICD-10-CM

## 2020-04-23 DIAGNOSIS — E785 Hyperlipidemia, unspecified: Secondary | ICD-10-CM | POA: Diagnosis not present

## 2020-04-23 DIAGNOSIS — I700 Atherosclerosis of aorta: Secondary | ICD-10-CM | POA: Diagnosis not present

## 2020-04-23 NOTE — Patient Instructions (Signed)
Medication Instructions:  Your physician recommends that you continue on your current medications as directed. Please refer to the Current Medication list given to you today.  *If you need a refill on your cardiac medications before your next appointment, please call your pharmacy*   Lab Work: Please return for FASTING labs in August (Lipid) --I will mail you lab orders closer to date as a reminder  Our in office lab hours are Monday-Friday 8:00-4:00, closed for lunch 12:45-1:45 pm.  No appointment needed.  If you have labs (blood work) drawn today and your tests are completely normal, you will receive your results only by: Marland Kitchen MyChart Message (if you have MyChart) OR . A paper copy in the mail If you have any lab test that is abnormal or we need to change your treatment, we will call you to review the results.  Follow-Up: At [facility], you and your health needs are our priority.  As part of our continuing mission to provide you with exceptional heart care, we have created designated Provider Care Teams.  These Care Teams include your primary Cardiologist (physician) and Advanced Practice Providers (APPs -  Physician Assistants and Nurse Practitioners) who all work together to provide you with the care you need, when you need it.  We recommend signing up for the patient portal called "MyChart".  Sign up information is provided on this After Visit Summary.  MyChart is used to connect with patients for Virtual Visits (Telemedicine).  Patients are able to view lab/test results, encounter notes, upcoming appointments, etc.  Non-urgent messages can be sent to your provider as well.   To learn more about what you can do with MyChart, go to [email or web address].    Your next appointment:   6 month(s)  The format for your next appointment:   In Person  Provider:   Oswaldo Milian, MD

## 2020-04-24 DIAGNOSIS — M4316 Spondylolisthesis, lumbar region: Secondary | ICD-10-CM | POA: Diagnosis not present

## 2020-04-24 DIAGNOSIS — M545 Low back pain: Secondary | ICD-10-CM | POA: Diagnosis not present

## 2020-04-26 DIAGNOSIS — R3914 Feeling of incomplete bladder emptying: Secondary | ICD-10-CM | POA: Diagnosis not present

## 2020-04-26 DIAGNOSIS — N5201 Erectile dysfunction due to arterial insufficiency: Secondary | ICD-10-CM | POA: Diagnosis not present

## 2020-04-26 DIAGNOSIS — R030 Elevated blood-pressure reading, without diagnosis of hypertension: Secondary | ICD-10-CM | POA: Insufficient documentation

## 2020-04-26 DIAGNOSIS — M4316 Spondylolisthesis, lumbar region: Secondary | ICD-10-CM | POA: Diagnosis not present

## 2020-04-26 DIAGNOSIS — M48062 Spinal stenosis, lumbar region with neurogenic claudication: Secondary | ICD-10-CM | POA: Diagnosis not present

## 2020-04-29 NOTE — Telephone Encounter (Signed)
[name], do you know if we got this?

## 2020-05-14 ENCOUNTER — Encounter: Payer: Self-pay | Admitting: Family Medicine

## 2020-05-15 NOTE — Telephone Encounter (Signed)
Pt stated the bite site is red and a few blisters, will schedule appointment to F/U with PCP

## 2020-05-15 NOTE — Telephone Encounter (Signed)
Please see message and advise if pt needs an appt.

## 2020-06-04 DIAGNOSIS — D225 Melanocytic nevi of trunk: Secondary | ICD-10-CM | POA: Diagnosis not present

## 2020-06-04 DIAGNOSIS — L814 Other melanin hyperpigmentation: Secondary | ICD-10-CM | POA: Diagnosis not present

## 2020-06-04 DIAGNOSIS — L309 Dermatitis, unspecified: Secondary | ICD-10-CM | POA: Diagnosis not present

## 2020-06-04 DIAGNOSIS — L718 Other rosacea: Secondary | ICD-10-CM | POA: Diagnosis not present

## 2020-06-04 DIAGNOSIS — L821 Other seborrheic keratosis: Secondary | ICD-10-CM | POA: Diagnosis not present

## 2020-06-04 DIAGNOSIS — L905 Scar conditions and fibrosis of skin: Secondary | ICD-10-CM | POA: Diagnosis not present

## 2020-06-04 DIAGNOSIS — L570 Actinic keratosis: Secondary | ICD-10-CM | POA: Diagnosis not present

## 2020-06-04 DIAGNOSIS — D1801 Hemangioma of skin and subcutaneous tissue: Secondary | ICD-10-CM | POA: Diagnosis not present

## 2020-06-04 DIAGNOSIS — Z85828 Personal history of other malignant neoplasm of skin: Secondary | ICD-10-CM | POA: Diagnosis not present

## 2020-06-06 DIAGNOSIS — M48062 Spinal stenosis, lumbar region with neurogenic claudication: Secondary | ICD-10-CM | POA: Diagnosis not present

## 2020-06-06 DIAGNOSIS — Z6825 Body mass index (BMI) 25.0-25.9, adult: Secondary | ICD-10-CM | POA: Insufficient documentation

## 2020-06-13 ENCOUNTER — Encounter: Payer: Self-pay | Admitting: *Deleted

## 2020-06-15 ENCOUNTER — Ambulatory Visit: Payer: Medicare HMO | Admitting: Family Medicine

## 2020-07-05 ENCOUNTER — Other Ambulatory Visit: Payer: Self-pay

## 2020-07-05 DIAGNOSIS — I2510 Atherosclerotic heart disease of native coronary artery without angina pectoris: Secondary | ICD-10-CM

## 2020-07-05 DIAGNOSIS — E785 Hyperlipidemia, unspecified: Secondary | ICD-10-CM | POA: Diagnosis not present

## 2020-07-05 LAB — LIPID PANEL
Chol/HDL Ratio: 2.5 ratio (ref 0.0–5.0)
Cholesterol, Total: 137 mg/dL (ref 100–199)
HDL: 54 mg/dL (ref >39)
LDL Chol Calc (NIH): 68 mg/dL (ref 0–99)
Triglycerides: 75 mg/dL (ref 0–149)
VLDL Cholesterol Cal: 15 mg/dL (ref 5–40)

## 2020-07-09 ENCOUNTER — Ambulatory Visit (INDEPENDENT_AMBULATORY_CARE_PROVIDER_SITE_OTHER): Payer: Medicare HMO | Admitting: Family Medicine

## 2020-07-09 ENCOUNTER — Other Ambulatory Visit: Payer: Self-pay

## 2020-07-09 ENCOUNTER — Encounter: Payer: Self-pay | Admitting: Family Medicine

## 2020-07-09 VITALS — BP 136/84 | HR 70 | Temp 97.3°F | Ht 70.0 in | Wt 204.0 lb

## 2020-07-09 DIAGNOSIS — M159 Polyosteoarthritis, unspecified: Secondary | ICD-10-CM

## 2020-07-09 DIAGNOSIS — G8929 Other chronic pain: Secondary | ICD-10-CM

## 2020-07-09 DIAGNOSIS — M545 Low back pain: Secondary | ICD-10-CM

## 2020-07-09 DIAGNOSIS — E039 Hypothyroidism, unspecified: Secondary | ICD-10-CM | POA: Diagnosis not present

## 2020-07-09 DIAGNOSIS — E785 Hyperlipidemia, unspecified: Secondary | ICD-10-CM | POA: Diagnosis not present

## 2020-07-09 DIAGNOSIS — N400 Enlarged prostate without lower urinary tract symptoms: Secondary | ICD-10-CM | POA: Diagnosis not present

## 2020-07-09 DIAGNOSIS — I10 Essential (primary) hypertension: Secondary | ICD-10-CM

## 2020-07-09 DIAGNOSIS — Z6829 Body mass index (BMI) 29.0-29.9, adult: Secondary | ICD-10-CM | POA: Diagnosis not present

## 2020-07-09 DIAGNOSIS — M8949 Other hypertrophic osteoarthropathy, multiple sites: Secondary | ICD-10-CM

## 2020-07-09 DIAGNOSIS — Z0001 Encounter for general adult medical examination with abnormal findings: Secondary | ICD-10-CM | POA: Diagnosis not present

## 2020-07-09 DIAGNOSIS — N529 Male erectile dysfunction, unspecified: Secondary | ICD-10-CM

## 2020-07-09 NOTE — Assessment & Plan Note (Signed)
Continue management per neurosurgery. 

## 2020-07-09 NOTE — Patient Instructions (Signed)
It was very nice to see you today!  No changes today.  Keep up the good work!  I will see you back in a year for your next annual visit with blood work.  Please come back to see me sooner if needed.   Take care, Dr Jerline Pain  Please try these tips to maintain a healthy lifestyle:   Eat at least 3 REAL meals and 1-2 snacks per day.  Aim for no more than 5 hours between eating.  If you eat breakfast, please do so within one hour of getting up.    Each meal should contain half fruits/vegetables, one quarter protein, and one quarter carbs (no bigger than a computer mouse)   Cut down on sweet beverages. This includes juice, soda, and sweet tea.     Drink at least 1 glass of water with each meal and aim for at least 8 glasses per day   Exercise at least 150 minutes every week.    Preventive Care 41 Years and Older, Male Preventive care refers to lifestyle choices and visits with your health care provider that can promote health and wellness. This includes:  A yearly physical exam. This is also called an annual well check.  Regular dental and eye exams.  Immunizations.  Screening for certain conditions.  Healthy lifestyle choices, such as diet and exercise. What can I expect for my preventive care visit? Physical exam Your health care provider will check:  Height and weight. These may be used to calculate body mass index (BMI), which is a measurement that tells if you are at a healthy weight.  Heart rate and blood pressure.  Your skin for abnormal spots. Counseling Your health care provider may ask you questions about:  Alcohol, tobacco, and drug use.  Emotional well-being.  Home and relationship well-being.  Sexual activity.  Eating habits.  History of falls.  Memory and ability to understand (cognition).  Work and work Statistician. What immunizations do I need?  Influenza (flu) vaccine  This is recommended every year. Tetanus, diphtheria, and  pertussis (Tdap) vaccine  You may need a Td booster every 10 years. Varicella (chickenpox) vaccine  You may need this vaccine if you have not already been vaccinated. Zoster (shingles) vaccine  You may need this after age 2. Pneumococcal conjugate (PCV13) vaccine  One dose is recommended after age 49. Pneumococcal polysaccharide (PPSV23) vaccine  One dose is recommended after age 62. Measles, mumps, and rubella (MMR) vaccine  You may need at least one dose of MMR if you were born in 1957 or later. You may also need a second dose. Meningococcal conjugate (MenACWY) vaccine  You may need this if you have certain conditions. Hepatitis A vaccine  You may need this if you have certain conditions or if you travel or work in places where you may be exposed to hepatitis A. Hepatitis B vaccine  You may need this if you have certain conditions or if you travel or work in places where you may be exposed to hepatitis B. Haemophilus influenzae type b (Hib) vaccine  You may need this if you have certain conditions. You may receive vaccines as individual doses or as more than one vaccine together in one shot (combination vaccines). Talk with your health care provider about the risks and benefits of combination vaccines. What tests do I need? Blood tests  Lipid and cholesterol levels. These may be checked every 5 years, or more frequently depending on your overall health.  Hepatitis C test.  Hepatitis B test. Screening  Lung cancer screening. You may have this screening every year starting at age 53 if you have a 30-pack-year history of smoking and currently smoke or have quit within the past 15 years.  Colorectal cancer screening. All adults should have this screening starting at age 31 and continuing until age 24. Your health care provider may recommend screening at age 16 if you are at increased risk. You will have tests every 1-10 years, depending on your results and the type of  screening test.  Prostate cancer screening. Recommendations will vary depending on your family history and other risks.  Diabetes screening. This is done by checking your blood sugar (glucose) after you have not eaten for a while (fasting). You may have this done every 1-3 years.  Abdominal aortic aneurysm (AAA) screening. You may need this if you are a current or former smoker.  Sexually transmitted disease (STD) testing. Follow these instructions at home: Eating and drinking  Eat a diet that includes fresh fruits and vegetables, whole grains, lean protein, and low-fat dairy products. Limit your intake of foods with high amounts of sugar, saturated fats, and salt.  Take vitamin and mineral supplements as recommended by your health care provider.  Do not drink alcohol if your health care provider tells you not to drink.  If you drink alcohol: ? Limit how much you have to 0-2 drinks a day. ? Be aware of how much alcohol is in your drink. In the [place] one drink equals one 12 oz bottle of beer (355 mL), one 5 oz glass of wine (148 mL), or one 1 oz glass of hard liquor (44 mL). Lifestyle  Take daily care of your teeth and gums.  Stay active. Exercise for at least 30 minutes on 5 or more days each week.  Do not use any products that contain nicotine or tobacco, such as cigarettes, e-cigarettes, and chewing tobacco. If you need help quitting, ask your health care provider.  If you are sexually active, practice safe sex. Use a condom or other form of protection to prevent STIs (sexually transmitted infections).  Talk with your health care provider about taking a low-dose aspirin or statin. What's next?  Visit your health care provider once a year for a well check visit.  Ask your health care provider how often you should have your eyes and teeth checked.  Stay up to date on all vaccines. This information is not intended to replace advice given to you by your health care provider.  Make sure you discuss any questions you have with your health care provider. Document Revised: 10/28/2018 Document Reviewed: 10/28/2018 Elsevier Patient Education  2020 [employer].

## 2020-07-09 NOTE — Assessment & Plan Note (Addendum)
Last LDL 68. Continue lipitor 40mg  daily.

## 2020-07-09 NOTE — Progress Notes (Signed)
Chief Complaint:  Carlos David is a 74 y.o. male who presents today for his annual comprehensive physical exam.    Assessment/Plan:  Chronic Problems Addressed Today: Dyslipidemia Last LDL 68. Continue lipitor 40mg  daily.   Hypothyroidism s/p thyroidectomy 1990 due to thyroid cancer Last TSH at goal. Continue synthroid 136mcg daily.   BPH s/p TURP 2020 Stable. Continue management per urology.   Erectile dysfunction Stable. Continue sildenafil as needed per urology.   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   Essential hypertension At goal. Continue losartan 50mg  daily.   Preventative Healthcare: Recently had labs that were within normal limits. UTD on covid vaccines. Due for colonoscopy later this year.   Patient Counseling(The following topics were reviewed and/or handout was given):  -Nutrition: Stressed importance of moderation in sodium/caffeine intake, saturated fat and cholesterol, caloric balance, sufficient intake of fresh fruits, vegetables, and fiber.  -Stressed the importance of regular exercise.   -Substance Abuse: Discussed cessation/primary prevention of tobacco, alcohol, or other drug use; driving or other dangerous activities under the influence; availability of treatment for abuse.   -Injury prevention: Discussed safety belts, safety helmets, smoke detector, smoking near bedding or upholstery.   -Sexuality: Discussed sexually transmitted diseases, partner selection, use of condoms, avoidance of unintended pregnancy and contraceptive alternatives.   -Dental health: Discussed importance of regular tooth brushing, flossing, and dental visits.  -Health maintenance and immunizations reviewed. Please refer to Health maintenance section.  Return to care in 1 year for next preventative visit.     Subjective:  HPI:  He has no acute complaints today.   Lifestyle Diet: Balanced. Tries to get  plenty of fruits and vegetables.  Exercise: Goes to the [facility].   Depression screen PHQ 2/9 07/06/2019  Decreased Interest 0  Down, Depressed, Hopeless 0  PHQ - 2 Score 0   There are no preventive care reminders to display for this patient.  ROS: Per HPI, otherwise a complete review of systems was negative.   PMH:  The following were reviewed and entered/updated in epic: Past Medical History:  Diagnosis Date  . Actinic keratosis 12/16/2010   Qualifier: Diagnosis of  By: [name]  MD, [name]   . BPH (benign prostatic hyperplasia)   . Cancer ([place])    thyroid cancer 1990  . Cataract    both eyes  . History of radiation therapy 1990  . HYPERLIPIDEMIA 12/26/2008  . HYPOTHYROIDISM 12/26/2008  . PPD positive   . THYROID CANCER, HX OF 1990  . THYROID CANCER, HX OF 12/26/2008   Qualifier: Diagnosis of  By: [name]  MD, [name]    Patient Active Problem List   Diagnosis Date Noted  . Essential hypertension 12/17/2018  . 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 Erectile dysfunction 12/17/2018  . BPH s/p TURP 2020 03/02/2014  . Osteoarthritis 07/16/2012  . Hypothyroidism s/p thyroidectomy 1990 due to thyroid cancer 12/26/2008  . Dyslipidemia 12/26/2008   Past Surgical History:  Procedure Laterality Date  . CATARACT EXTRACTION, BILATERAL  2020  . COLONOSCOPY     10-ye ago-normal at [facility]  . EYE SURGERY     as child  . neckotomy  1990   20 lymph nodes removed from left neck  . TOTAL THYROIDECTOMY  1990  . TRANSURETHRAL RESECTION OF PROSTATE N/A 01/21/2019   Procedure: TRANSURETHRAL RESECTION OF THE PROSTATE (TURP);  Surgeon: [name], MD;  [facility];  Service: Urology;  Laterality: N/A;  72 MINS    Family History  Problem Relation Age of Onset  . Colon cancer Neg Hx     Medications- reviewed and updated Current Outpatient Medications  Medication Sig Dispense Refill  . aspirin EC 81 MG tablet  Take 81 mg by mouth daily. Swallow whole.    Marland Kitchen atorvastatin (LIPITOR) 40 MG tablet Take 1 tablet (40 mg total) by mouth daily. 90 tablet 3  . finasteride (PROSCAR) 5 MG tablet TAKE 1 TABLET BY MOUTH EVERY DAY 90 tablet 1  . gabapentin (NEURONTIN) 300 MG capsule Take 300 mg by mouth 2 (two) times daily.    . Glucosamine-Chondroit-Vit C-Mn (GLUCOSAMINE 1500 COMPLEX PO) Take by mouth.    . levothyroxine (SYNTHROID) 137 MCG tablet TAKE 1 TABLET BY MOUTH EVERY DAY 90 tablet 3  . meloxicam (MOBIC) 15 MG tablet TAKE 1 TABLET (15 MG TOTAL) BY MOUTH DAILY. 30 tablet 0  . nitroGLYCERIN (NITROSTAT) 0.4 MG SL tablet Place 1 tablet (0.4 mg total) under the tongue every 5 (five) minutes as needed for chest pain. 50 tablet 3  . sildenafil (REVATIO) 20 MG tablet Take 20 mg by mouth 3 (three) times daily.    Marland Kitchen losartan (COZAAR) 50 MG tablet Take 1 tablet (50 mg total) by mouth daily. 90 tablet 3   No current facility-administered medications for this visit.    Allergies-reviewed and updated Allergies  Allergen Reactions  . Penicillins Hives    Social History   Socioeconomic History  . Marital status: Married    Spouse name: Not on file  . Number of children: Not on file  . Years of education: Not on file  . Highest education level: Not on file  Occupational History  . Not on file  Tobacco Use  . Smoking status: Former Smoker    Types: Cigarettes, Pipe    Quit date: 11/17/1968    Years since quitting: 51.6  . Smokeless tobacco: Never Used  Vaping Use  . Vaping Use: Never used  Substance and Sexual Activity  . Alcohol use: Yes    Alcohol/week: 0.0 standard drinks    Comment: occas. beer  . Drug use: No  . Sexual activity: Not on file  Other Topics Concern  . Not on file  Social History Narrative  . Not on file   Social Determinants of Health   Financial Resource Strain:   . Difficulty of Paying Living Expenses: Not on file  Food Insecurity:   . Worried About Charity fundraiser in  the Last Year: Not on file  . Ran Out of Food in the Last Year: Not on file  Transportation Needs:   . Lack of Transportation (Medical): Not on file  . Lack of Transportation (Non-Medical): Not on file  Physical Activity:   . Days of Exercise per Week: Not on file  . Minutes of Exercise per Session: Not on file  Stress:   . Feeling of Stress : Not on file  Social Connections:   . Frequency of Communication with Friends and Family: Not on file  . Frequency of Social Gatherings with Friends and Family: Not on file  . Attends Religious Services: Not on file  . Active Member of Clubs or Organizations: Not on file  . Attends Archivist Meetings: Not on file  . Marital Status: Not on file        Objective:  Physical Exam: BP 136/84   Pulse 70  Temp (!) 97.3 F (36.3 C) (Temporal)   Ht 5\' 10"  (1.778 m)   Wt 204 lb (92.5 kg)   SpO2 97%   BMI 29.27 kg/m   Body mass index is 29.27 kg/m. Wt Readings from Last 3 Encounters:  07/09/20 204 lb (92.5 kg)  04/23/20 204 lb 3.2 oz (92.6 kg)  01/19/20 209 lb (94.8 kg)   Gen: NAD, resting comfortably HEENT: TMs normal bilaterally. OP clear. No thyromegaly noted.  CV: RRR with no murmurs appreciated Pulm: NWOB, CTAB with no crackles, wheezes, or rhonchi GI: Normal bowel sounds present. Soft, Nontender, Nondistended. MSK: no edema, cyanosis, or clubbing noted Skin: warm, dry Neuro: CN2-12 grossly intact. Strength 5/5 in upper and lower extremities. Reflexes symmetric and intact bilaterally.  Psych: Normal affect and thought content     Jenasis Straley M. Jerline Pain, MD 07/09/2020 10:05 AM

## 2020-07-09 NOTE — Assessment & Plan Note (Signed)
Stable. Continue sildenafil as needed per urology.

## 2020-07-09 NOTE — Assessment & Plan Note (Addendum)
At goal.  Continue losartan 50 mg daily. 

## 2020-07-09 NOTE — Assessment & Plan Note (Signed)
Stable.  Continue management per urology. 

## 2020-07-09 NOTE — Assessment & Plan Note (Addendum)
Last TSH at goal. Continue synthroid 124mcg daily.

## 2020-07-13 ENCOUNTER — Ambulatory Visit (INDEPENDENT_AMBULATORY_CARE_PROVIDER_SITE_OTHER): Payer: Medicare HMO

## 2020-07-13 ENCOUNTER — Other Ambulatory Visit: Payer: Self-pay

## 2020-07-13 ENCOUNTER — Ambulatory Visit: Payer: Medicare HMO | Admitting: Podiatry

## 2020-07-13 DIAGNOSIS — M2141 Flat foot [pes planus] (acquired), right foot: Secondary | ICD-10-CM

## 2020-07-13 DIAGNOSIS — M76822 Posterior tibial tendinitis, left leg: Secondary | ICD-10-CM

## 2020-07-13 DIAGNOSIS — M19071 Primary osteoarthritis, right ankle and foot: Secondary | ICD-10-CM

## 2020-07-13 DIAGNOSIS — M2142 Flat foot [pes planus] (acquired), left foot: Secondary | ICD-10-CM

## 2020-07-13 DIAGNOSIS — M7671 Peroneal tendinitis, right leg: Secondary | ICD-10-CM | POA: Diagnosis not present

## 2020-07-13 DIAGNOSIS — S99921A Unspecified injury of right foot, initial encounter: Secondary | ICD-10-CM

## 2020-07-13 DIAGNOSIS — M79671 Pain in right foot: Secondary | ICD-10-CM

## 2020-07-13 DIAGNOSIS — M19072 Primary osteoarthritis, left ankle and foot: Secondary | ICD-10-CM

## 2020-07-13 DIAGNOSIS — M79672 Pain in left foot: Secondary | ICD-10-CM

## 2020-07-13 MED ORDER — NONFORMULARY OR COMPOUNDED ITEM
Status: DC
Start: 2020-07-13 — End: 2022-02-25

## 2020-07-15 NOTE — Progress Notes (Signed)
  Subjective:  Patient ID: Carlos David, male    DOB: Apr 06, 1946,  MRN: [id number]  Chief Complaint  Patient presents with  . Foot Injury    Right lateral ankle pain, 2 day duration, "stepped wrong and heard a crack".  . Foot Pain    Left medial foot pain 1 year duration, severe but transient.  . Nail Problem    Left 1st toenail discolored/thick   74 y.o. male presents with the above complaint. History confirmed with patient.   Objective:  Physical Exam: warm, good capillary refill, no trophic changes or ulcerative lesions, normal DP and PT pulses and normal sensory exam. Left Foot: POP medial arch, first metatarsophalangeal joint clinically stiff without pain palpation Right Foot: POP 5th metarsal base, mild pain at the peroneal tendons, first metatarsophalangeal joint clinically stiff without pain palpation  No images are attached to the encounter.  Radiographs: X-ray of both feet: No fracture dislocations noted.  Severe degenerative disease of the first metatarsophalangeal joint Assessment:   1. Peroneal tendonitis, right   2. Posterior tibial tendinitis of left lower extremity   3. Pes planus of both feet   4. Injury of right foot, initial encounter      Plan:  Patient was evaluated and treated and all questions answered.  Right Foot Injury, peroneal tendinitis -X-rays taken without definite fracture. -Rest the area in the cam boot -Repeat x-rays next visit  Pes planus left with posterior tibial tendinitis -Would benefit from CMO's at a later date  Onychomycosis -Nail gently debrided, recommend urea gel.  Rx given from compound pharmacy  Return in about 2 weeks (around 07/27/2020) for F/u Right foot injury , with XRs.

## 2020-07-19 ENCOUNTER — Telehealth: Payer: Self-pay | Admitting: Family Medicine

## 2020-07-19 NOTE — Telephone Encounter (Signed)
Left message for patient to call back and schedule Medicare Annual Wellness Visit (AWV) either virtually/audio only OR in office. Whatever the patients preference is. ° °Last AWV 07/06/19; please schedule at anytime with [facility]-Nurse Health Advisor at  [place]. ° °This should be a 45 minute visit. ° ° °

## 2020-07-25 ENCOUNTER — Other Ambulatory Visit: Payer: Self-pay | Admitting: Podiatry

## 2020-07-25 DIAGNOSIS — M76822 Posterior tibial tendinitis, left leg: Secondary | ICD-10-CM

## 2020-07-27 ENCOUNTER — Ambulatory Visit: Payer: Medicare HMO | Admitting: Podiatry

## 2020-08-08 DIAGNOSIS — M7061 Trochanteric bursitis, right hip: Secondary | ICD-10-CM | POA: Diagnosis not present

## 2020-08-08 DIAGNOSIS — M65351 Trigger finger, right little finger: Secondary | ICD-10-CM | POA: Diagnosis not present

## 2020-08-21 DIAGNOSIS — M5416 Radiculopathy, lumbar region: Secondary | ICD-10-CM | POA: Diagnosis not present

## 2020-08-21 DIAGNOSIS — M48062 Spinal stenosis, lumbar region with neurogenic claudication: Secondary | ICD-10-CM | POA: Diagnosis not present

## 2020-08-22 ENCOUNTER — Encounter: Payer: Self-pay | Admitting: Podiatry

## 2020-08-24 ENCOUNTER — Ambulatory Visit: Payer: Medicare HMO | Admitting: Podiatry

## 2020-09-03 DIAGNOSIS — H5212 Myopia, left eye: Secondary | ICD-10-CM | POA: Diagnosis not present

## 2020-09-03 DIAGNOSIS — H524 Presbyopia: Secondary | ICD-10-CM | POA: Diagnosis not present

## 2020-09-03 DIAGNOSIS — Z961 Presence of intraocular lens: Secondary | ICD-10-CM | POA: Diagnosis not present

## 2020-09-03 DIAGNOSIS — H52203 Unspecified astigmatism, bilateral: Secondary | ICD-10-CM | POA: Diagnosis not present

## 2020-09-30 ENCOUNTER — Ambulatory Visit: Payer: Medicare HMO | Attending: Internal Medicine

## 2020-09-30 DIAGNOSIS — Z23 Encounter for immunization: Secondary | ICD-10-CM

## 2020-09-30 NOTE — Progress Notes (Signed)
   Covid-19 Vaccination Clinic  Name:  Carlos David    MRN: [id number] DOB: 1946/08/01  09/30/2020  [name] was observed post Covid-19 immunization for 15 minutes without incident. He was provided with Vaccine Information Sheet and instruction to access the V-Safe system.   [name] was instructed to call 911 with any severe reactions post vaccine: Marland Kitchen Difficulty breathing  . Swelling of face and throat  . A fast heartbeat  . A bad rash all over body  . Dizziness and weakness   Immunizations Administered    Name Date Dose VIS Date Route   Pfizer COVID-19 Vaccine 09/30/2020  9:16 AM 0.3 mL 09/05/2020 Intramuscular   Manufacturer: [place]   Lot: [id number]   [place]: [address]-7

## 2020-10-23 ENCOUNTER — Encounter: Payer: Self-pay | Admitting: Cardiology

## 2020-10-23 ENCOUNTER — Ambulatory Visit: Payer: Medicare HMO | Admitting: Cardiology

## 2020-10-23 ENCOUNTER — Other Ambulatory Visit: Payer: Self-pay

## 2020-10-23 VITALS — BP 134/80 | HR 69 | Ht 70.0 in | Wt 206.4 lb

## 2020-10-23 DIAGNOSIS — I77819 Aortic ectasia, unspecified site: Secondary | ICD-10-CM

## 2020-10-23 DIAGNOSIS — E785 Hyperlipidemia, unspecified: Secondary | ICD-10-CM

## 2020-10-23 DIAGNOSIS — I10 Essential (primary) hypertension: Secondary | ICD-10-CM | POA: Diagnosis not present

## 2020-10-23 DIAGNOSIS — I2510 Atherosclerotic heart disease of native coronary artery without angina pectoris: Secondary | ICD-10-CM | POA: Diagnosis not present

## 2020-10-23 MED ORDER — LOSARTAN POTASSIUM 100 MG PO TABS
100.0000 mg | ORAL_TABLET | Freq: Every day | ORAL | 3 refills | Status: DC
Start: 2020-10-23 — End: 2021-10-14

## 2020-10-23 NOTE — Progress Notes (Signed)
Cardiology Office Note:    Date:  10/23/2020   ID:  JEF FUTCH, DOB 1946/03/26, MRN [id number]  PCP:  Vivi Barrack, MD  Cardiologist:  No primary care provider on file.  Electrophysiologist:  None   Referring MD: Vivi Barrack, MD   No chief complaint on file.   History of Present Illness:    Carlos David is a 74 y.o. male with a hx of thyroid cancer now hypothyroidism, hyperlipidemia who presents for follow-up.  He was referred by [name], PA-C for evaluation of chest pain, initially seen on 01/19/2020.  He reported that he has been having chest pain for the preceding few months.  Occured daily.  Chest pain is located under his left breast.  Describes as pinching pain.  Can last for few minutes.  Typically occurs at rest.  Reports that he exercises at the [facility], will do the elliptical for 30 minutes.  Has not noted exertional chest pain.  Does report some lightheadedness with standing.  Denies any syncope, palpitations, lower extremity edema.  Smoked cigarettes as teenager for few years.  Smoked pipe for 10 years.  Mother had MI in 3s.    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  Since last clinic visit, he reports that he is doing well.  He denies any further chest pain.  Reports he has been going to the gym 3 to 4 days/week.  Will get elliptical for 30 minutes, ride bike for 30 minutes, and do weight machines.  He denies any exertional chest pain or dyspnea.  Does however report he has some dyspnea when walking up hills.  He denies any lightheadedness, syncope, lower extremity edema, or palpitations.  Brought his BP log with him, has been 130s to 140s over 80s to  90s.    Past Medical History:  Diagnosis Date  . Actinic keratosis 12/16/2010   Qualifier: Diagnosis of  By: [name]  MD, [name]   . BPH (benign prostatic hyperplasia)   . Cancer ([place])    thyroid cancer 1990  . Cataract    both eyes  . History of radiation therapy 1990  . HYPERLIPIDEMIA 12/26/2008  . HYPOTHYROIDISM 12/26/2008  . PPD positive   . THYROID CANCER, HX OF 1990  . THYROID CANCER, HX OF 12/26/2008   Qualifier: Diagnosis of  By: [name]  MD, [name]     Past Surgical History:  Procedure Laterality Date  . CATARACT EXTRACTION, BILATERAL  2020  . COLONOSCOPY     10-ye ago-normal at [facility]  . EYE SURGERY     as child  . neckotomy  1990   20 lymph nodes removed from left neck  . TOTAL THYROIDECTOMY  1990  . TRANSURETHRAL RESECTION OF PROSTATE N/A 01/21/2019   Procedure: TRANSURETHRAL RESECTION OF THE PROSTATE (TURP);  Surgeon: [name], MD;  Location: [facility];  Service: Urology;  Laterality: N/A;  75 MINS    Current Medications: Current Meds  Medication Sig  . atorvastatin (LIPITOR) 40 MG tablet Take 1 tablet (40 mg total) by mouth daily.  . cyclobenzaprine (FLEXERIL) 10 MG tablet   . finasteride (PROSCAR) 5 MG tablet TAKE 1 TABLET BY MOUTH EVERY DAY  . gabapentin (  NEURONTIN) 300 MG capsule Take 300 mg by mouth 2 (two) times daily.  . Glucosamine-Chondroit-Vit C-Mn (GLUCOSAMINE 1500 COMPLEX PO) Take by mouth.  . hydrocortisone 2.5 % cream   . levothyroxine (SYNTHROID) 137 MCG tablet TAKE 1 TABLET BY MOUTH EVERY DAY  . losartan (COZAAR) 100 MG tablet Take 1 tablet (100 mg total) by mouth daily.  . meloxicam (MOBIC) 15 MG tablet TAKE 1 TABLET (15 MG TOTAL) BY MOUTH DAILY.  . nitroGLYCERIN (NITROSTAT) 0.4 MG SL tablet Place 1 tablet (0.4 mg total) under the tongue every 5 (five) minutes as needed for chest pain.  . NONFORMULARY OR COMPOUNDED ITEM Antifungal gel  :Urea gel 40%   Order faxed to [facility]  . sildenafil (REVATIO) 20  MG tablet Take 20 mg by mouth 3 (three) times daily.  Marland Kitchen triamcinolone cream (KENALOG) 0.1 % Apply topically.  . [DISCONTINUED] aspirin 81 MG chewable tablet Chew by mouth.  . [DISCONTINUED] aspirin EC 81 MG tablet Take 81 mg by mouth daily. Swallow whole.  . [DISCONTINUED] atorvastatin (LIPITOR) 20 MG tablet Take by mouth.  . [DISCONTINUED] chlorhexidine (PERIDEX) 0.12 % solution   . [DISCONTINUED] Glucosamine Sulfate 500 MG TABS Take by mouth.  . [DISCONTINUED] levocetirizine (XYZAL) 5 MG tablet Take 5 mg by mouth at bedtime as needed.  . [DISCONTINUED] losartan (COZAAR) 50 MG tablet Take 1 tablet (50 mg total) by mouth daily.  . [DISCONTINUED] tamsulosin (FLOMAX) 0.4 MG CAPS capsule Take by mouth.     Allergies:   Penicillins   Social History   Socioeconomic History  . Marital status: Married    Spouse name: Not on file  . Number of children: Not on file  . Years of education: Not on file  . Highest education level: Not on file  Occupational History  . Not on file  Tobacco Use  . Smoking status: Former Smoker    Types: Cigarettes, Pipe    Quit date: 11/17/1968    Years since quitting: 51.9  . Smokeless tobacco: Never Used  Vaping Use  . Vaping Use: Never used  Substance and Sexual Activity  . Alcohol use: Yes    Alcohol/week: 0.0 standard drinks    Comment: occas. beer  . Drug use: No  . Sexual activity: Not on file  Other Topics Concern  . Not on file  Social History Narrative  . Not on file   Social Determinants of Health   Financial Resource Strain:   . Difficulty of Paying Living Expenses: Not on file  Food Insecurity:   . Worried About Charity fundraiser in the Last Year: Not on file  . Ran Out of Food in the Last Year: Not on file  Transportation Needs:   . Lack of Transportation (Medical): Not on file  . Lack of Transportation (Non-Medical): Not on file  Physical Activity:   . Days of Exercise per Week: Not on file  . Minutes of Exercise per Session: Not  on file  Stress:   . Feeling of Stress : Not on file  Social Connections:   . Frequency of Communication with Friends and Family: Not on file  . Frequency of Social Gatherings with Friends and Family: Not on file  . Attends Religious Services: Not on file  . Active Member of Clubs or Organizations: Not on file  . Attends Archivist Meetings: Not on file  . Marital Status: Not on file     Family History: The patient's family history is negative for  Colon cancer.  ROS:   Please see the history of present illness.     All other systems reviewed and are negative.  EKGs/Labs/Other Studies Reviewed:    The following studies were reviewed today:   EKG:  EKG is not ordered today.  The ekg ordered most recetnly demonstrates normal sinus rhythm, rate 69, left axis deviation, no ST/T abnormalities  Lexiscan Myoview 11/28/16:  Nuclear stress EF: 54%.  The study is normal.  This is a low risk study.  The left ventricular ejection fraction is mildly decreased (45-54%).  There was no ST segment deviation noted during stress.   Normal resting and stress perfusion. No ischemia or infarction EF 54%  Coronary CTA 03/15/20: IMPRESSION: 1. Coronary calcium score of 526. This was 31 percentile for age and sex matched control.  2. Normal coronary origin with right dominance.  3. CAD-RADS 3. Mild stenosis in the proximal and moderate stenosis in the mid LAD. Consider symptom-guided anti-ischemic pharmacotherapy as well as risk factor modification per guideline directed care. Additional analysis with CT FFR will be submitted.  4. 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 No dissection.  5. Mildly dilated pulmonary artery measuring 33 mm.  1. Left Main: 0.99.  2. LAD: Proximal: 0.96, mid: 0.86, distal: 0.83. 3. LCX: 0.94 4. OM1: 0.96. 5. OM2: 9.97. 6. RCA: Proximal: 0.98, distal: 0.96.  IMPRESSION: 1. CT FFR analysis didn't show any  significant stenosis. Aggressive medical management is recommended.   IMPRESSION: 1.  No acute findings in the imaged extracardiac chest. 2.  Aortic Atherosclerosis (ICD10-I70.0).  TTE 02/06/20: 1. Left ventricular ejection fraction, by estimation, is 60 to 65%. The  left ventricle has normal function. The left ventricle has no regional  wall motion abnormalities. There is mild asymmetric left ventricular  hypertrophy. Left ventricular diastolic  parameters are indeterminate.  2. Right ventricular systolic function is normal. The right ventricular  size is normal.  3. The mitral valve is normal in structure. Mild mitral valve  regurgitation. No evidence of mitral stenosis.  4. The aortic valve is normal in structure. Aortic valve regurgitation is  not visualized. No aortic stenosis is present.  5. Aortic dilatation noted. There is mild to moderate dilatation of the  ascending aorta measuring 44 mm.   Recent Labs: 01/18/2020: ALT 26; Hemoglobin 14.3; Platelets 194.0; TSH 1.73 03/14/2020: BUN 25; Potassium 4.5; Sodium 138 03/15/2020: Creatinine, Ser 1.00  Recent Lipid Panel    Component Value Date/Time   CHOL 137 07/05/2020 0823   TRIG 75 07/05/2020 0823   HDL 54 07/05/2020 0823   CHOLHDL 2.5 07/05/2020 0823   CHOLHDL 3 07/06/2019 0859   VLDL 16.6 07/06/2019 0859   LDLCALC 68 07/05/2020 0823    Physical Exam:    VS:  BP 134/80   Pulse 69   Ht 5\' 10"  (1.778 m)   Wt 206 lb 6.4 oz (93.6 kg)   SpO2 98%   BMI 29.62 kg/m     Wt Readings from Last 3 Encounters:  10/23/20 206 lb 6.4 oz (93.6 kg)  07/09/20 204 lb (92.5 kg)  04/23/20 204 lb 3.2 oz (92.6 kg)     GEN:  Well nourished, well developed in no acute distress HEENT: Normal NECK: No JVD; No carotid bruits LYMPHATICS: No lymphadenopathy CARDIAC: RRR, no murmurs, rubs, gallops RESPIRATORY:  Clear to auscultation without rales, wheezing or rhonchi  ABDOMEN: Soft, non-tender, non-distended MUSCULOSKELETAL:  No  edema; No deformity  SKIN: Warm and dry NEUROLOGIC:  Alert and oriented x 3 PSYCHIATRIC:  Normal affect   ASSESSMENT:    1. CAD in native artery   2. Essential hypertension   3. Hyperlipidemia, unspecified hyperlipidemia type   4. Ectatic aorta (HCC)    PLAN:     CAD: Presented with atypical chest pain.  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  He reports chest pain has resolved. -Continue atorvastatin 40 mg daily.  Hypertension: On losartan 50 mg daily.  BP remains above goal less than 130/80, will increase losartan to 100 mg daily.  Check BMP in 1 week.  Asked patient to check BP twice daily for next 2 weeks and call with results.  Hyperlipidemia: Continue atorvastatin 40 mg daily.  LDL 68 on 07/05/2020, at goal less than 70  Aortic dilatation: ascending aorta measures 77mm on CTA.  Repeat CTA in 1 year (02/2020)  RTC in 6 months  Medication Adjustments/Labs and Tests Ordered: Current medicines are reviewed at length with the patient today.  Concerns regarding medicines are outlined above.  Orders Placed This Encounter  Procedures  . Basic metabolic panel  . EKG 12-Lead   Meds ordered this encounter  Medications  . losartan (COZAAR) 100 MG tablet    Sig: Take 1 tablet (100 mg total) by mouth daily.    Dispense:  90 tablet    Refill:  3    Dose increase    Patient Instructions  Medication Instructions:  INCREASE losartan to 100 mg daily  *If you need a refill on your cardiac medications before your next appointment, please call your pharmacy*   Lab Work: BMET in [place]  If you have labs (blood work) drawn today and your tests are completely normal, you will receive your results only by: Marland Kitchen MyChart Message (if you have MyChart) OR . A paper copy in the mail If you have any lab test that is  abnormal or we need to change your treatment, we will call you to review the results.   Testing/Procedures: CTA chest due in May 2022  Follow-Up: At [facility], you and your health needs are our priority.  As part of our continuing mission to provide you with exceptional heart care, we have created designated Provider Care Teams.  These Care Teams include your primary Cardiologist (physician) and Advanced Practice Providers (APPs -  Physician Assistants and Nurse Practitioners) who all work together to provide you with the care you need, when you need it.  We recommend signing up for the patient portal called "MyChart".  Sign up information is provided on this After Visit Summary.  MyChart is used to connect with patients for Virtual Visits (Telemedicine).  Patients are able to view lab/test results, encounter notes, upcoming appointments, etc.  Non-urgent messages can be sent to your provider as well.   To learn more about what you can do with MyChart, go to [email or web address].    Your next appointment:   6 month(s)  The format for your next appointment:   In Person  Provider:   Oswaldo Milian, MD   Other Instructions Please check your blood pressure at home daily, write it down.  Call the office or send message via Mychart with the readings in 2 weeks for Dr. Gardiner Rhyme to review.       Signed, Donato Heinz, MD  10/23/2020 2:00 PM  [facility]

## 2020-10-23 NOTE — Patient Instructions (Signed)
Medication Instructions:  INCREASE losartan to 100 mg daily  *If you need a refill on your cardiac medications before your next appointment, please call your pharmacy*   Lab Work: BMET in [place]  If you have labs (blood work) drawn today and your tests are completely normal, you will receive your results only by: Marland Kitchen MyChart Message (if you have MyChart) OR . A paper copy in the mail If you have any lab test that is abnormal or we need to change your treatment, we will call you to review the results.   Testing/Procedures: CTA chest due in May 2022  Follow-Up: At [facility], you and your health needs are our priority.  As part of our continuing mission to provide you with exceptional heart care, we have created designated Provider Care Teams.  These Care Teams include your primary Cardiologist (physician) and Advanced Practice Providers (APPs -  Physician Assistants and Nurse Practitioners) who all work together to provide you with the care you need, when you need it.  We recommend signing up for the patient portal called "MyChart".  Sign up information is provided on this After Visit Summary.  MyChart is used to connect with patients for Virtual Visits (Telemedicine).  Patients are able to view lab/test results, encounter notes, upcoming appointments, etc.  Non-urgent messages can be sent to your provider as well.   To learn more about what you can do with MyChart, go to [email or web address].    Your next appointment:   6 month(s)  The format for your next appointment:   In Person  Provider:   Oswaldo Milian, MD   Other Instructions Please check your blood pressure at home daily, write it down.  Call the office or send message via Mychart with the readings in 2 weeks for Dr. Gardiner Rhyme to review.

## 2020-11-01 DIAGNOSIS — I2510 Atherosclerotic heart disease of native coronary artery without angina pectoris: Secondary | ICD-10-CM | POA: Diagnosis not present

## 2020-11-01 DIAGNOSIS — I10 Essential (primary) hypertension: Secondary | ICD-10-CM | POA: Diagnosis not present

## 2020-11-01 LAB — BASIC METABOLIC PANEL
BUN/Creatinine Ratio: 19 (ref 10–24)
BUN: 21 mg/dL (ref 8–27)
CO2: 26 mmol/L (ref 20–29)
Calcium: 9.5 mg/dL (ref 8.6–10.2)
Chloride: 101 mmol/L (ref 96–106)
Creatinine, Ser: 1.13 mg/dL (ref 0.76–1.27)
GFR calc Af Amer: 73 mL/min/{1.73_m2} (ref >59)
GFR calc non Af Amer: 63 mL/min/{1.73_m2} (ref >59)
Glucose: 94 mg/dL (ref 65–99)
Potassium: 4.7 mmol/L (ref 3.5–5.2)
Sodium: 139 mmol/L (ref 134–144)

## 2020-11-15 ENCOUNTER — Ambulatory Visit (INDEPENDENT_AMBULATORY_CARE_PROVIDER_SITE_OTHER): Payer: Medicare HMO

## 2020-11-15 ENCOUNTER — Other Ambulatory Visit: Payer: Self-pay

## 2020-11-15 ENCOUNTER — Ambulatory Visit: Payer: Medicare HMO | Admitting: Podiatry

## 2020-11-15 ENCOUNTER — Ambulatory Visit: Payer: Medicare HMO

## 2020-11-15 DIAGNOSIS — M19079 Primary osteoarthritis, unspecified ankle and foot: Secondary | ICD-10-CM | POA: Diagnosis not present

## 2020-11-15 DIAGNOSIS — M19071 Primary osteoarthritis, right ankle and foot: Secondary | ICD-10-CM

## 2020-11-15 DIAGNOSIS — M19072 Primary osteoarthritis, left ankle and foot: Secondary | ICD-10-CM | POA: Diagnosis not present

## 2020-11-15 NOTE — Patient Instructions (Signed)
Use the voltaren gel 3-4x daily for the pain. Wear your stiffer shoes. The new orthotics will help

## 2020-11-16 NOTE — Progress Notes (Signed)
  Subjective:  Patient ID: Carlos David, male    DOB: Jun 07, 1946,  MRN: [id number]  Chief Complaint  Patient presents with  . Foot Pain    Bilateral foot pain towards the top of the foot.    74 y.o. male presents with the above complaint. History confirmed with patient.  He is well-known to me through his wife who is a patient of mine.  He has had arthritis in the big toe joints for many years.  Previously Dr. Stacie Acres had seen him for this and prescribed him custom molded orthoses about 10 to 12 years ago which she still has.  He is now also having pain on the dorsal midfoot worse in the left on the top of the mid arch.  Objective:  Physical Exam: warm, good capillary refill, no trophic changes or ulcerative lesions, normal DP and PT pulses and normal sensory exam.  Bilaterally he has minimal motion of the first MTPJ with pain and crepitance noted, dorsal spurring, he has dorsal spurring over the talonavicular and navicular cuneiform joints as well   Radiographs: X-ray of both feet: Severe osteoarthrosis of the first MTPJ bilaterally with dorsal spurring worse on the right, scattered dorsal spurring and degenerative changes of the midtarsal joints bilaterally Assessment:   1. 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 Osteoarthritis of midfoot, unspecified laterality      Plan:  Patient was evaluated and treated and all questions answered.   Discussed him the etiology and treatment options for osteoarthritis within the foot.  We discussed surgical and nonsurgical treatment options including stiffer shoes, pair which he has which he will switch to that has a rollbar and I think this will improve his symptoms.  We also discussed orthotic support.  He currently has some custom orthotics that are nearly a decade old.  These have a Morton's cutout bilaterally.  Considering the end-stage arthrosis of his first MTPJ I do not think this would be beneficial for him  more I think a rigid carbon fiber insert that will support his longitudinal arch and midfoot arthritis as well as a Morton's extension to support the first ray would improve him significantly.  He will be scheduled for casting for these with our pedorthist [employer].  We also discussed eventual surgical treatment including midfoot arthrodesis as well as arthrodesis versus arthroplasty with an implant of the first MPJ bilaterally.  He is not quite ready to pursue this and I think this is reasonable, if it worsens to the point where the orthotics are no longer support of, anti-inflammatories not helpful and Voltaren gel is not helpful we could consider this.  Return if symptoms worsen or fail to improve.

## 2020-11-19 ENCOUNTER — Telehealth: Payer: Self-pay | Admitting: Podiatry

## 2020-11-19 NOTE — Telephone Encounter (Signed)
Pt called and left message with some questions about the orthotics that Dr Lilian Kapur has recommended. He is wanting to know the cost, he is aware insurance will not pay for them.   I returned call and we discussed the cost of 438.00 and he said he has 3 pair that he wears that Dr Stacie Acres got him when he was practicing on his own. I then told pt we could refurbish the old ones for 90.00 each. He has a lot going on and is going to think about it and will drop off the old ones if he wants to go that route.

## 2020-11-20 ENCOUNTER — Other Ambulatory Visit: Payer: Medicare HMO | Admitting: Orthotics

## 2020-11-23 DIAGNOSIS — M5416 Radiculopathy, lumbar region: Secondary | ICD-10-CM | POA: Diagnosis not present

## 2020-11-23 DIAGNOSIS — R030 Elevated blood-pressure reading, without diagnosis of hypertension: Secondary | ICD-10-CM | POA: Diagnosis not present

## 2020-11-23 DIAGNOSIS — M48062 Spinal stenosis, lumbar region with neurogenic claudication: Secondary | ICD-10-CM | POA: Diagnosis not present

## 2020-11-23 DIAGNOSIS — Z6829 Body mass index (BMI) 29.0-29.9, adult: Secondary | ICD-10-CM | POA: Diagnosis not present

## 2020-11-29 ENCOUNTER — Other Ambulatory Visit: Payer: Self-pay | Admitting: Podiatry

## 2020-11-29 DIAGNOSIS — M19072 Primary osteoarthritis, left ankle and foot: Secondary | ICD-10-CM

## 2020-11-29 DIAGNOSIS — M19071 Primary osteoarthritis, right ankle and foot: Secondary | ICD-10-CM

## 2020-12-13 DIAGNOSIS — Z85828 Personal history of other malignant neoplasm of skin: Secondary | ICD-10-CM | POA: Diagnosis not present

## 2020-12-13 DIAGNOSIS — L905 Scar conditions and fibrosis of skin: Secondary | ICD-10-CM | POA: Diagnosis not present

## 2020-12-13 DIAGNOSIS — L821 Other seborrheic keratosis: Secondary | ICD-10-CM | POA: Diagnosis not present

## 2020-12-13 DIAGNOSIS — L814 Other melanin hyperpigmentation: Secondary | ICD-10-CM | POA: Diagnosis not present

## 2020-12-13 DIAGNOSIS — D225 Melanocytic nevi of trunk: Secondary | ICD-10-CM | POA: Diagnosis not present

## 2020-12-13 DIAGNOSIS — Z872 Personal history of diseases of the skin and subcutaneous tissue: Secondary | ICD-10-CM | POA: Diagnosis not present

## 2020-12-13 DIAGNOSIS — L570 Actinic keratosis: Secondary | ICD-10-CM | POA: Diagnosis not present

## 2020-12-13 DIAGNOSIS — Z85820 Personal history of malignant melanoma of skin: Secondary | ICD-10-CM | POA: Diagnosis not present

## 2020-12-18 DIAGNOSIS — M79644 Pain in right finger(s): Secondary | ICD-10-CM | POA: Diagnosis not present

## 2020-12-27 ENCOUNTER — Encounter: Payer: Self-pay | Admitting: Gastroenterology

## 2021-01-16 DIAGNOSIS — M65341 Trigger finger, right ring finger: Secondary | ICD-10-CM | POA: Diagnosis not present

## 2021-01-16 DIAGNOSIS — M720 Palmar fascial fibromatosis [Dupuytren]: Secondary | ICD-10-CM | POA: Diagnosis not present

## 2021-01-16 DIAGNOSIS — R2231 Localized swelling, mass and lump, right upper limb: Secondary | ICD-10-CM | POA: Diagnosis not present

## 2021-01-16 DIAGNOSIS — R2230 Localized swelling, mass and lump, unspecified upper limb: Secondary | ICD-10-CM | POA: Insufficient documentation

## 2021-01-25 DIAGNOSIS — M65341 Trigger finger, right ring finger: Secondary | ICD-10-CM | POA: Insufficient documentation

## 2021-02-19 ENCOUNTER — Other Ambulatory Visit: Payer: Self-pay | Admitting: *Deleted

## 2021-02-19 ENCOUNTER — Telehealth: Payer: Self-pay | Admitting: Cardiology

## 2021-02-19 DIAGNOSIS — Z01812 Encounter for preprocedural laboratory examination: Secondary | ICD-10-CM

## 2021-02-19 DIAGNOSIS — I77819 Aortic ectasia, unspecified site: Secondary | ICD-10-CM

## 2021-02-19 NOTE — Telephone Encounter (Signed)
Left message for patient to call and discuss scheduling the CTA chest/aorta ordered by Dr. Gardiner Rhyme

## 2021-02-20 NOTE — Telephone Encounter (Signed)
Spoke with patient regarding the Wednesday 03/13/21 8:30 am CTA chest/aorta at [address], [address]---arrival time is 8:15 am for check in----patient is aware to come in for lab work 1 week prior to study.

## 2021-02-22 ENCOUNTER — Other Ambulatory Visit: Payer: Self-pay | Admitting: Cardiology

## 2021-02-22 ENCOUNTER — Ambulatory Visit (INDEPENDENT_AMBULATORY_CARE_PROVIDER_SITE_OTHER): Payer: Medicare HMO

## 2021-02-22 DIAGNOSIS — Z0000 Encounter for general adult medical examination without abnormal findings: Secondary | ICD-10-CM

## 2021-02-22 DIAGNOSIS — I77819 Aortic ectasia, unspecified site: Secondary | ICD-10-CM | POA: Diagnosis not present

## 2021-02-22 DIAGNOSIS — Z01812 Encounter for preprocedural laboratory examination: Secondary | ICD-10-CM | POA: Diagnosis not present

## 2021-02-22 LAB — BASIC METABOLIC PANEL
BUN/Creatinine Ratio: 21 (ref 10–24)
BUN: 24 mg/dL (ref 8–27)
CO2: 20 mmol/L (ref 20–29)
Calcium: 9.4 mg/dL (ref 8.6–10.2)
Chloride: 103 mmol/L (ref 96–106)
Creatinine, Ser: 1.17 mg/dL (ref 0.76–1.27)
Glucose: 99 mg/dL (ref 65–99)
Potassium: 4.8 mmol/L (ref 3.5–5.2)
Sodium: 137 mmol/L (ref 134–144)
eGFR: 65 mL/min/{1.73_m2} (ref >59)

## 2021-02-22 NOTE — Patient Instructions (Addendum)
Carlos David , Thank you for taking time to come for your Medicare Wellness Visit. I appreciate your ongoing commitment to your health goals. Please review the following plan we discussed and let me know if I can assist you in the future.   Screening recommendations/referrals: Colonoscopy: Due 2024 per Dr Owens Loffler pt stated  Recommended yearly ophthalmology/optometry visit for glaucoma screening and checkup Recommended yearly dental visit for hygiene and checkup  Vaccinations: Influenza vaccine: Up to date Pneumococcal vaccine: Up to date Tdap vaccine: Up to date Shingles vaccine: Shingrix discussed. Please contact your pharmacy for coverage information.    Covid-19: Completed 2/7, 3/4, & 09/30/20  Advanced directives: Please bring a copy of your health care power of attorney and living will to the office at your convenience.  Conditions/risks identified: Lose weight and stay active   Next appointment: Follow up in one year for your annual wellness visit.   Preventive Care 4 Years and Older, Male Preventive care refers to lifestyle choices and visits with your health care provider that can promote health and wellness. What does preventive care include?  A yearly physical exam. This is also called an annual well check.  Dental exams once or twice a year.  Routine eye exams. Ask your health care provider how often you should have your eyes checked.  Personal lifestyle choices, including:  Daily care of your teeth and gums.  Regular physical activity.  Eating a healthy diet.  Avoiding tobacco and drug use.  Limiting alcohol use.  Practicing safe sex.  Taking low doses of aspirin every day.  Taking vitamin and mineral supplements as recommended by your health care provider. What happens during an annual well check? The services and screenings done by your health care provider during your annual well check will depend on your age, overall health, lifestyle risk  factors, and family history of disease. Counseling  Your health care provider may ask you questions about your:  Alcohol use.  Tobacco use.  Drug use.  Emotional well-being.  Home and relationship well-being.  Sexual activity.  Eating habits.  History of falls.  Memory and ability to understand (cognition).  Work and work Statistician. Screening  You may have the following tests or measurements:  Height, weight, and BMI.  Blood pressure.  Lipid and cholesterol levels. These may be checked every 5 years, or more frequently if you are over 2 years old.  Skin check.  Lung cancer screening. You may have this screening every year starting at age 8 if you have a 30-pack-year history of smoking and currently smoke or have quit within the past 15 years.  Fecal occult blood test (FOBT) of the stool. You may have this test every year starting at age 52.  Flexible sigmoidoscopy or colonoscopy. You may have a sigmoidoscopy every 5 years or a colonoscopy every 10 years starting at age 77.  Prostate cancer screening. Recommendations will vary depending on your family history and other risks.  Hepatitis C blood test.  Hepatitis B blood test.  Sexually transmitted disease (STD) testing.  Diabetes screening. This is done by checking your blood sugar (glucose) after you have not eaten for a while (fasting). You may have this done every 1-3 years.  Abdominal aortic aneurysm (AAA) screening. You may need this if you are a current or former smoker.  Osteoporosis. You may be screened starting at age 78 if you are at high risk. Talk with your health care provider about your test results, treatment options, and  if necessary, the need for more tests. Vaccines  Your health care provider may recommend certain vaccines, such as:  Influenza vaccine. This is recommended every year.  Tetanus, diphtheria, and acellular pertussis (Tdap, Td) vaccine. You may need a Td booster every 10  years.  Zoster vaccine. You may need this after age 52.  Pneumococcal 13-valent conjugate (PCV13) vaccine. One dose is recommended after age 82.  Pneumococcal polysaccharide (PPSV23) vaccine. One dose is recommended after age 40. Talk to your health care provider about which screenings and vaccines you need and how often you need them. This information is not intended to replace advice given to you by your health care provider. Make sure you discuss any questions you have with your health care provider. Document Released: 11/30/2015 Document Revised: 07/23/2016 Document Reviewed: 09/04/2015 Elsevier Interactive Patient Education  2017 [place] Prevention in the Home Falls can cause injuries. They can happen to people of all ages. There are many things you can do to make your home safe and to help prevent falls. What can I do on the outside of my home?  Regularly fix the edges of walkways and driveways and fix any cracks.  Remove anything that might make you trip as you walk through a door, such as a raised step or threshold.  Trim any bushes or trees on the path to your home.  Use bright outdoor lighting.  Clear any walking paths of anything that might make someone trip, such as rocks or tools.  Regularly check to see if handrails are loose or broken. Make sure that both sides of any steps have handrails.  Any raised decks and porches should have guardrails on the edges.  Have any leaves, snow, or ice cleared regularly.  Use sand or salt on walking paths during winter.  Clean up any spills in your garage right away. This includes oil or grease spills. What can I do in the bathroom?  Use night lights.  Install grab bars by the toilet and in the tub and shower. Do not use towel bars as grab bars.  Use non-skid mats or decals in the tub or shower.  If you need to sit down in the shower, use a plastic, non-slip stool.  Keep the floor dry. Clean up any water that  spills on the floor as soon as it happens.  Remove soap buildup in the tub or shower regularly.  Attach bath mats securely with double-sided non-slip rug tape.  Do not have throw rugs and other things on the floor that can make you trip. What can I do in the bedroom?  Use night lights.  Make sure that you have a light by your bed that is easy to reach.  Do not use any sheets or blankets that are too big for your bed. They should not hang down onto the floor.  Have a firm chair that has side arms. You can use this for support while you get dressed.  Do not have throw rugs and other things on the floor that can make you trip. What can I do in the kitchen?  Clean up any spills right away.  Avoid walking on wet floors.  Keep items that you use a lot in easy-to-reach places.  If you need to reach something above you, use a strong step stool that has a grab bar.  Keep electrical cords out of the way.  Do not use floor polish or wax that makes floors slippery. If you  must use wax, use non-skid floor wax.  Do not have throw rugs and other things on the floor that can make you trip. What can I do with my stairs?  Do not leave any items on the stairs.  Make sure that there are handrails on both sides of the stairs and use them. Fix handrails that are broken or loose. Make sure that handrails are as long as the stairways.  Check any carpeting to make sure that it is firmly attached to the stairs. Fix any carpet that is loose or worn.  Avoid having throw rugs at the top or bottom of the stairs. If you do have throw rugs, attach them to the floor with carpet tape.  Make sure that you have a light switch at the top of the stairs and the bottom of the stairs. If you do not have them, ask someone to add them for you. What else can I do to help prevent falls?  Wear shoes that:  Do not have high heels.  Have rubber bottoms.  Are comfortable and fit you well.  Are closed at the  toe. Do not wear sandals.  If you use a stepladder:  Make sure that it is fully opened. Do not climb a closed stepladder.  Make sure that both sides of the stepladder are locked into place.  Ask someone to hold it for you, if possible.  Clearly mark and make sure that you can see:  Any grab bars or handrails.  First and last steps.  Where the edge of each step is.  Use tools that help you move around (mobility aids) if they are needed. These include:  Canes.  Walkers.  Scooters.  Crutches.  Turn on the lights when you go into a dark area. Replace any light bulbs as soon as they burn out.  Set up your furniture so you have a clear path. Avoid moving your furniture around.  If any of your floors are uneven, fix them.  If there are any pets around you, be aware of where they are.  Review your medicines with your doctor. Some medicines can make you feel dizzy. This can increase your chance of falling. Ask your doctor what other things that you can do to help prevent falls. This information is not intended to replace advice given to you by your health care provider. Make sure you discuss any questions you have with your health care provider. Document Released: 08/30/2009 Document Revised: 04/10/2016 Document Reviewed: 12/08/2014 Elsevier Interactive Patient Education  2017 [employer].

## 2021-02-22 NOTE — Progress Notes (Addendum)
Virtual Visit via Telephone Note  I connected with  Carlos David on 02/22/21 at 11:45 AM EDT by telephone and verified that I am speaking with the correct person using two identifiers.  Location: Patient: Home Provider: Office  Persons participating in the virtual visit: patient/Nurse Health Advisor   I discussed the limitations, risks, security and privacy concerns of performing an evaluation and management service by telephone and the availability of in person appointments. The patient expressed understanding and agreed to proceed.  Interactive audio and video telecommunications were attempted between this nurse and patient, however failed, due to patient having technical difficulties OR patient did not have access to video capability.  We continued and completed visit with audio only.  Some vital signs may be absent or patient reported.   Willette Brace, LPN    Subjective:   Carlos David is a 75 y.o. male who presents for Medicare Annual/Subsequent preventive examination.  Review of Systems     Cardiac Risk Factors include: advanced age (>65men, >48 women);hypertension;dyslipidemia;male gender     Objective:    There were no vitals filed for this visit. There is no height or weight on file to calculate BMI.  Advanced Directives 02/22/2021 07/06/2019 09/06/2015 02/28/2014  Does Patient Have a Medical Advance Directive? Yes Yes Yes Patient does not have advance directive  Type of Advance Directive Living will [place];Living will [place];Living will -  Does patient want to make changes to medical advance directive? - No - Patient declined - -  Copy of [place] in Chart? - No - copy requested - -    Current Medications (verified) Outpatient Encounter Medications as of 02/22/2021  Medication Sig  . atorvastatin (LIPITOR) 40 MG tablet Take 1 tablet (40 mg total) by mouth daily.  . cyclobenzaprine (FLEXERIL) 10 MG  tablet   . finasteride (PROSCAR) 5 MG tablet TAKE 1 TABLET BY MOUTH EVERY DAY  . gabapentin (NEURONTIN) 300 MG capsule Take 300 mg by mouth 2 (two) times daily.  . Glucosamine-Chondroit-Vit C-Mn (GLUCOSAMINE 1500 COMPLEX PO) Take by mouth.  . hydrocortisone 2.5 % cream   . levothyroxine (SYNTHROID) 137 MCG tablet TAKE 1 TABLET BY MOUTH EVERY DAY  . losartan (COZAAR) 100 MG tablet Take 1 tablet (100 mg total) by mouth daily.  . meloxicam (MOBIC) 15 MG tablet TAKE 1 TABLET (15 MG TOTAL) BY MOUTH DAILY.  . sildenafil (REVATIO) 20 MG tablet Take 20 mg by mouth 3 (three) times daily.  Marland Kitchen triamcinolone cream (KENALOG) 0.1 % Apply topically.  . NONFORMULARY OR COMPOUNDED ITEM Antifungal gel  :Urea gel 40%   Order faxed to [place] (Patient not taking: Reported on 02/22/2021)  . [DISCONTINUED] nitroGLYCERIN (NITROSTAT) 0.4 MG SL tablet Place 1 tablet (0.4 mg total) under the tongue every 5 (five) minutes as needed for chest pain. (Patient not taking: Reported on 02/22/2021)   No facility-administered encounter medications on file as of 02/22/2021.    Allergies (verified) Penicillins   History: Past Medical History:  Diagnosis Date  . Actinic keratosis 12/16/2010   Qualifier: Diagnosis of  By: [name]  MD, [name]   . BPH (benign prostatic hyperplasia)   . Cancer ([place])    thyroid cancer 1990  . Cataract    both eyes  . History of radiation therapy 1990  . HYPERLIPIDEMIA 12/26/2008  . HYPOTHYROIDISM 12/26/2008  . PPD positive   . THYROID CANCER, HX OF 1990  . THYROID CANCER, HX  OF 12/26/2008   Qualifier: Diagnosis of  By: [name]  MD, [name]    Past Surgical History:  Procedure Laterality Date  . CATARACT EXTRACTION, BILATERAL  2020  . COLONOSCOPY     10-ye ago-normal at [facility]  . EYE SURGERY     as child  . HAND SURGERY     right hand trigger figger   . neckotomy  1990   20 lymph nodes removed from left neck  . TOTAL THYROIDECTOMY  1990  . TRANSURETHRAL RESECTION OF  PROSTATE N/A 01/21/2019   Procedure: TRANSURETHRAL RESECTION OF THE PROSTATE (TURP);  Surgeon: [name], MD;  Location: [facility];  Service: Urology;  Laterality: N/A;  70 MINS   Family History  Problem Relation Age of Onset  . Colon cancer Neg Hx    Social History   Socioeconomic History  . Marital status: Married    Spouse name: Not on file  . Number of children: Not on file  . Years of education: Not on file  . Highest education level: Not on file  Occupational History  . Not on file  Tobacco Use  . Smoking status: Former Smoker    Types: Cigarettes, Pipe    Quit date: 11/17/1968    Years since quitting: 52.3  . Smokeless tobacco: Never Used  Vaping Use  . Vaping Use: Never used  Substance and Sexual Activity  . Alcohol use: Yes    Alcohol/week: 0.0 standard drinks    Comment: occas. beer  . Drug use: No  . Sexual activity: Not on file  Other Topics Concern  . Not on file  Social History Narrative  . Not on file   Social Determinants of Health   Financial Resource Strain: Low Risk   . Difficulty of Paying Living Expenses: Not hard at all  Food Insecurity: No Food Insecurity  . Worried About Charity fundraiser in the Last Year: Never true  . Ran Out of Food in the Last Year: Never true  Transportation Needs: No Transportation Needs  . Lack of Transportation (Medical): No  . Lack of Transportation (Non-Medical): No  Physical Activity: Sufficiently Active  . Days of Exercise per Week: 3 days  . Minutes of Exercise per Session: 120 min  Stress: No Stress Concern Present  . Feeling of Stress : Not at all  Social Connections: Moderately Integrated  . Frequency of Communication with Friends and Family: More than three times a week  . Frequency of Social Gatherings with Friends and Family: More than three times a week  . Attends Religious Services: More than 4 times per year  . Active Member of Clubs or Organizations: No  . Attends Theatre manager Meetings: Never  . Marital Status: Married    Tobacco Counseling Counseling given: Not Answered   Clinical Intake:  Pre-visit preparation completed: Yes  Pain : No/denies pain     BMI - recorded: 29.62 Nutritional Status: BMI 25 -29 Overweight Nutritional Risks: None Diabetes: No  How often do you need to have someone help you when you read instructions, pamphlets, or other written materials from your doctor or pharmacy?: 1 - Never  Diabetic?No  Interpreter Needed?: No  Information entered by :: Charlott Rakes, LPN   Activities of Daily Living In your present state of health, do you have any difficulty performing the following activities: 02/22/2021  Hearing? N  Vision? N  Difficulty concentrating or making decisions? N  Walking or climbing stairs? [name] Mclean  Comment back issues  Dressing or bathing? N  Doing errands, shopping? N  Preparing Food and eating ? N  Using the Toilet? N  In the past six months, have you accidently leaked urine? N  Do you have problems with loss of bowel control? N  Managing your Medications? N  Managing your Finances? N  Housekeeping or managing your Housekeeping? N  Some recent data might be hidden    Patient Care Team: Vivi Barrack, MD as PCP - General (Family Medicine) [name], MD as Consulting Physician (Urology) Marlaine Hind, MD as Consulting Physician (Physical Medicine and Rehabilitation) Rutherford Guys, MD as Consulting Physician (Ophthalmology) Consuella Lose, MD as Consulting Physician (Neurosurgery)  Indicate any recent Medical Services you may have received from other than [facility] providers in the past year (date may be approximate).     Assessment:   This is a routine wellness examination for Carlos David.  Hearing/Vision screen  Hearing Screening   125Hz  250Hz  500Hz  1000Hz  2000Hz  3000Hz  4000Hz  6000Hz  8000Hz   Right ear:           Left ear:           Comments: Pt denies any hearing issues   Vision  Screening Comments: Pt follows up with Dr Gershon Crane for annual eye exams   Dietary issues and exercise activities discussed: Current Exercise Habits: Home exercise routine, Type of exercise: walking;strength training/weights;stretching;Other - see comments (work out at [employer]), Time (Minutes): > 60, Frequency (Times/Week): 4, Weekly Exercise (Minutes/Week): 0  Goals    . Blood Pressure < 140/90    . Patient Stated     Lose weight and keep active       Depression Screen PHQ 2/9 Scores 02/22/2021 07/06/2019 12/17/2018 06/17/2016 03/27/2015 03/09/2014  PHQ - 2 Score 0 0 0 0 0 0    Fall Risk Fall Risk  02/22/2021 07/06/2019 06/17/2016 03/27/2015 03/09/2014  Falls in the past year? 0 0 No No No  Number falls in past yr: 0 0 - - -  Injury with Fall? 0 0 - - -  Risk for fall due to : Impaired vision Impaired balance/gait - - -  Follow up Falls prevention discussed Education provided - - -    FALL RISK PREVENTION PERTAINING TO THE HOME:  Any stairs in or around the home? No  If so, are there any without handrails? No  Home free of loose throw rugs in walkways, pet beds, electrical cords, etc? Yes  Adequate lighting in your home to reduce risk of falls? Yes   ASSISTIVE DEVICES UTILIZED TO PREVENT FALLS:  Life alert? No  Use of a cane, walker or w/c? No  Grab bars in the bathroom? No  Shower chair or bench in shower? No  Elevated toilet seat or a handicapped toilet? No   TIMED UP AND GO:  Was the test performed? No     Cognitive Function:     6CIT Screen 02/22/2021  What Year? 0 points  What month? 0 points  Count back from 20 0 points  Months in reverse 0 points  Repeat phrase 0 points    Immunizations Immunization History  Administered Date(s) Administered  . Hep A / Hep B 05/24/2012  . Hepatitis B 06/24/2012, 12/27/2012  . Influenza,inj,Quad PF,6+ Mos 11/24/2013  . PFIZER(Purple Top)SARS-COV-2 Vaccination 12/25/2019, 01/19/2020, 09/30/2020  . Pneumococcal Conjugate-13 03/09/2014   . Pneumococcal Polysaccharide-23 02/17/2012  . Tetanus 09/20/2014    TDAP status: Up to date  Flu Vaccine status: Declined, Education  has been provided regarding the importance of this vaccine but patient still declined. Advised may receive this vaccine at local pharmacy or Health Dept. Aware to provide a copy of the vaccination record if obtained from local pharmacy or Health Dept. Verbalized acceptance and understanding.  Pneumococcal vaccine status: Up to date  Covid-19 vaccine status: Completed vaccines  Qualifies for Shingles Vaccine? Yes   Zostavax completed No   Shingrix Completed?: No.    Education has been provided regarding the importance of this vaccine. Patient has been advised to call insurance company to determine out of pocket expense if they have not yet received this vaccine. Advised may also receive vaccine at local pharmacy or Health Dept. Verbalized acceptance and understanding.  Screening Tests Health Maintenance  Topic Date Due  . COLONOSCOPY (Pts 45-63yrs Insurance coverage will need to be confirmed)  10/01/2020  . TETANUS/TDAP  09/20/2024  . COVID-19 Vaccine  Completed  . Hepatitis C Screening  Completed  . PNA vac Low Risk Adult  Completed  . HPV VACCINES  Aged Out  . INFLUENZA VACCINE  Discontinued    Health Maintenance  Health Maintenance Due  Topic Date Due  . COLONOSCOPY (Pts 45-3yrs Insurance coverage will need to be confirmed)  10/01/2020    Colorectal cancer screening: Type of screening: Colonoscopy. Completed 10/02/15. Repeat every 5 years pt stated Dr Owens Loffler stated to be done 2024    Additional Screening:  Hepatitis C Screening:  Completed 07/06/19  Vision Screening: Recommended annual ophthalmology exams for early detection of glaucoma and other disorders of the eye. Is the patient up to date with their annual eye exam?  Yes  Who is the provider or what is the name of the office in which the patient attends annual eye exams? Dr  Gershon Crane  If pt is not established with a provider, would they like to be referred to a provider to establish care? No .   Dental Screening: Recommended annual dental exams for proper oral hygiene  Community Resource Referral / Chronic Care Management: CRR required this visit?  No   CCM required this visit?  No      Plan:     I have personally reviewed and noted the following in the patient's chart:   . Medical and social history . Use of alcohol, tobacco or illicit drugs  . Current medications and supplements . Functional ability and status . Nutritional status . Physical activity . Advanced directives . List of other physicians . Hospitalizations, surgeries, and ER visits in previous 12 months . Vitals . Screenings to include cognitive, depression, and falls . Referrals and appointments  In addition, I have reviewed and discussed with patient certain preventive protocols, quality metrics, and best practice recommendations. A written personalized care plan for preventive services as well as general preventive health recommendations were provided to patient.     Willette Brace, LPN   01/17/8249   Nurse Notes: None

## 2021-03-13 ENCOUNTER — Ambulatory Visit (INDEPENDENT_AMBULATORY_CARE_PROVIDER_SITE_OTHER)
Admission: RE | Admit: 2021-03-13 | Discharge: 2021-03-13 | Disposition: A | Discharge status: Home / Self Care | Payer: Medicare HMO | Source: Ambulatory Visit | Attending: Cardiology | Admitting: Cardiology

## 2021-03-13 ENCOUNTER — Other Ambulatory Visit: Payer: Self-pay

## 2021-03-13 DIAGNOSIS — I712 Thoracic aortic aneurysm, without rupture, unspecified: Secondary | ICD-10-CM

## 2021-03-13 DIAGNOSIS — R911 Solitary pulmonary nodule: Secondary | ICD-10-CM | POA: Diagnosis not present

## 2021-03-13 MED ORDER — IOHEXOL 350 MG/ML SOLN
100.0000 mL | Freq: Once | INTRAVENOUS | Status: AC | PRN
  Administered 2021-03-13: 100 mL via INTRAVENOUS

## 2021-03-19 ENCOUNTER — Other Ambulatory Visit: Payer: Self-pay | Admitting: *Deleted

## 2021-03-19 DIAGNOSIS — I712 Thoracic aortic aneurysm, without rupture, unspecified: Secondary | ICD-10-CM

## 2021-03-19 DIAGNOSIS — R911 Solitary pulmonary nodule: Secondary | ICD-10-CM

## 2021-03-20 ENCOUNTER — Other Ambulatory Visit: Payer: Self-pay | Admitting: Cardiology

## 2021-03-20 DIAGNOSIS — Z6829 Body mass index (BMI) 29.0-29.9, adult: Secondary | ICD-10-CM | POA: Diagnosis not present

## 2021-03-20 DIAGNOSIS — R030 Elevated blood-pressure reading, without diagnosis of hypertension: Secondary | ICD-10-CM | POA: Diagnosis not present

## 2021-03-20 DIAGNOSIS — M5416 Radiculopathy, lumbar region: Secondary | ICD-10-CM | POA: Diagnosis not present

## 2021-03-20 NOTE — Telephone Encounter (Signed)
This is Dr. Schumann's pt 

## 2021-03-29 ENCOUNTER — Other Ambulatory Visit: Payer: Self-pay | Admitting: Family Medicine

## 2021-04-11 ENCOUNTER — Other Ambulatory Visit: Payer: Self-pay | Admitting: Family Medicine

## 2021-04-21 NOTE — Progress Notes (Deleted)
Cardiology Office Note:    Date:  04/21/2021   ID:  ESTANISLAO HARMON, DOB Jul 05, 1946, MRN [id number]  PCP:  Vivi Barrack, MD  Cardiologist:  None  Electrophysiologist:  None   Referring MD: Vivi Barrack, MD   No chief complaint on file.   History of Present Illness:    Carlos David is a 75 y.o. male with a hx of thyroid cancer now hypothyroidism, hyperlipidemia who presents for follow-up.  He was referred by [name], PA-C for evaluation of chest pain, initially seen on 01/19/2020.  He reported that he has been having chest pain for the preceding few months.  Occured daily.  Chest pain is located under his left breast.  Describes as pinching pain.  Can last for few minutes.  Typically occurs at rest.  Reports that he exercises at the [facility], will do the elliptical for 30 minutes.  Has not noted exertional chest pain.  Does report some lightheadedness with standing.  Denies any syncope, palpitations, lower extremity edema.  Smoked cigarettes as teenager for few years.  Smoked pipe for 10 years.  Mother had MI in 54s.    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  Since last clinic visit, he reports that he is doing well.  He denies any further chest pain.  Reports he has been going to the gym 3 to 4 days/week.  Will get elliptical for 30 minutes, ride bike for 30 minutes, and do weight machines.  He denies any exertional chest pain or dyspnea.  Does however report he has some dyspnea when walking up hills.  He denies any lightheadedness, syncope, lower extremity edema, or palpitations.  Brought his BP log with him, has been 130s to 140s over 80s to 90s.    Past Medical History:   Diagnosis Date  . Actinic keratosis 12/16/2010   Qualifier: Diagnosis of  By: [name]  MD, [name]   . BPH (benign prostatic hyperplasia)   . Cancer ([place])    thyroid cancer 1990  . Cataract    both eyes  . History of radiation therapy 1990  . HYPERLIPIDEMIA 12/26/2008  . HYPOTHYROIDISM 12/26/2008  . PPD positive   . THYROID CANCER, HX OF 1990  . THYROID CANCER, HX OF 12/26/2008   Qualifier: Diagnosis of  By: [name]  MD, [name]     Past Surgical History:  Procedure Laterality Date  . CATARACT EXTRACTION, BILATERAL  2020  . COLONOSCOPY     10-ye ago-normal at [facility]  . EYE SURGERY     as child  . HAND SURGERY     right hand trigger figger   . neckotomy  1990   20 lymph nodes removed from left neck  . TOTAL THYROIDECTOMY  1990  . TRANSURETHRAL RESECTION OF PROSTATE N/A 01/21/2019   Procedure: TRANSURETHRAL RESECTION OF THE PROSTATE (TURP);  Surgeon: [name], MD;  Location: [facility];  Service: Urology;  Laterality: N/A;  75 MINS    Current Medications: No outpatient medications have been marked as taking for the 04/22/21 encounter (Appointment) with Donato Heinz, MD.     Allergies:   Penicillins   Social History   Socioeconomic History  . Marital status:  Married    Spouse name: Not on file  . Number of children: Not on file  . Years of education: Not on file  . Highest education level: Not on file  Occupational History  . Not on file  Tobacco Use  . Smoking status: Former Smoker    Types: Cigarettes, Pipe    Quit date: 11/17/1968    Years since quitting: 52.4  . Smokeless tobacco: Never Used  Vaping Use  . Vaping Use: Never used  Substance and Sexual Activity  . Alcohol use: Yes    Alcohol/week: 0.0 standard drinks    Comment: occas. beer  . Drug use: No  . Sexual activity: Not on file  Other Topics Concern  . Not on file  Social History Narrative  . Not on file   Social Determinants of Health   Financial Resource  Strain: Low Risk   . Difficulty of Paying Living Expenses: Not hard at all  Food Insecurity: No Food Insecurity  . Worried About Charity fundraiser in the Last Year: Never true  . Ran Out of Food in the Last Year: Never true  Transportation Needs: No Transportation Needs  . Lack of Transportation (Medical): No  . Lack of Transportation (Non-Medical): No  Physical Activity: Sufficiently Active  . Days of Exercise per Week: 3 days  . Minutes of Exercise per Session: 120 min  Stress: No Stress Concern Present  . Feeling of Stress : Not at all  Social Connections: Moderately Integrated  . Frequency of Communication with Friends and Family: More than three times a week  . Frequency of Social Gatherings with Friends and Family: More than three times a week  . Attends Religious Services: More than 4 times per year  . Active Member of Clubs or Organizations: No  . Attends Archivist Meetings: Never  . Marital Status: Married     Family History: The patient's family history is negative for Colon cancer.  ROS:   Please see the history of present illness.     All other systems reviewed and are negative.  EKGs/Labs/Other Studies Reviewed:    The following studies were reviewed today:   EKG:  EKG is not ordered today.  The ekg ordered most recetnly demonstrates normal sinus rhythm, rate 69, left axis deviation, no ST/T abnormalities  Lexiscan Myoview 11/28/16:  Nuclear stress EF: 54%.  The study is normal.  This is a low risk study.  The left ventricular ejection fraction is mildly decreased (45-54%).  There was no ST segment deviation noted during stress.   Normal resting and stress perfusion. No ischemia or infarction EF 54%  Coronary CTA 03/15/20: IMPRESSION: 1. Coronary calcium score of 526. This was 79 percentile for age and sex matched control.  2. Normal coronary origin with right dominance.  3. CAD-RADS 3. Mild stenosis in the proximal and moderate  stenosis in the mid LAD. Consider symptom-guided anti-ischemic pharmacotherapy as well as risk factor modification per guideline directed care. Additional analysis with CT FFR will be submitted.  4. 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 No dissection.  5. Mildly dilated pulmonary artery measuring 33 mm.  1. Left Main: 0.99.  2. LAD: Proximal: 0.96, mid: 0.86, distal: 0.83. 3. LCX: 0.94 4. OM1: 0.96. 5. OM2: 9.97. 6. RCA: Proximal: 0.98, distal: 0.96.  IMPRESSION: 1. CT FFR analysis didn't show any significant stenosis. Aggressive medical management is recommended.   IMPRESSION: 1.  No acute findings in the imaged extracardiac  chest. 2.  Aortic Atherosclerosis (ICD10-I70.0).  TTE 02/06/20: 1. Left ventricular ejection fraction, by estimation, is 60 to 65%. The  left ventricle has normal function. The left ventricle has no regional  wall motion abnormalities. There is mild asymmetric left ventricular  hypertrophy. Left ventricular diastolic  parameters are indeterminate.  2. Right ventricular systolic function is normal. The right ventricular  size is normal.  3. The mitral valve is normal in structure. Mild mitral valve  regurgitation. No evidence of mitral stenosis.  4. The aortic valve is normal in structure. Aortic valve regurgitation is  not visualized. No aortic stenosis is present.  5. Aortic dilatation noted. There is mild to moderate dilatation of the  ascending aorta measuring 44 mm.   Recent Labs: 02/22/2021: BUN 24; Creatinine, Ser 1.17; Potassium 4.8; Sodium 137  Recent Lipid Panel    Component Value Date/Time   CHOL 137 07/05/2020 0823   TRIG 75 07/05/2020 0823   HDL 54 07/05/2020 0823   CHOLHDL 2.5 07/05/2020 0823   CHOLHDL 3 07/06/2019 0859   VLDL 16.6 07/06/2019 0859   LDLCALC 68 07/05/2020 0823    Physical Exam:    VS:  There were no vitals taken for this visit.    Wt Readings from Last 3 Encounters:  10/23/20  206 lb 6.4 oz (93.6 kg)  07/09/20 204 lb (92.5 kg)  04/23/20 204 lb 3.2 oz (92.6 kg)     GEN:  Well nourished, well developed in no acute distress HEENT: Normal NECK: No JVD; No carotid bruits LYMPHATICS: No lymphadenopathy CARDIAC: RRR, no murmurs, rubs, gallops RESPIRATORY:  Clear to auscultation without rales, wheezing or rhonchi  ABDOMEN: Soft, non-tender, non-distended MUSCULOSKELETAL:  No edema; No deformity  SKIN: Warm and dry NEUROLOGIC:  Alert and oriented x 3 PSYCHIATRIC:  Normal affect   ASSESSMENT:    No diagnosis found. PLAN:     CAD: Presented with atypical chest pain.  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  He reports chest pain has resolved. -Continue atorvastatin 40 mg daily.  Hypertension: On losartan 100 mg daily  Hyperlipidemia: Continue atorvastatin 40 mg daily.  LDL 68 on 07/05/2020, at goal less than 70  Aortic dilatation: ascending aorta measures 90mm on CTA.  Repeat CTA on 02/2021 showed stable ascending aortic dilatation measuring 40 mm.  Pulmonary nodule: 5 mm right upper lobe nodule noted on CTA chest 03/13/2021.  Follow-up CT chest in 1 year  RTC in***  Medication Adjustments/Labs and Tests Ordered: Current medicines are reviewed at length with the patient today.  Concerns regarding medicines are outlined above.  No orders of the defined types were placed in this encounter.  No orders of the defined types were placed in this encounter.   There are no Patient Instructions on file for this visit.   Signed, Donato Heinz, MD  04/21/2021 9:27 PM    [place]

## 2021-04-22 ENCOUNTER — Other Ambulatory Visit: Payer: Self-pay

## 2021-04-22 ENCOUNTER — Encounter: Payer: Self-pay | Admitting: Cardiology

## 2021-04-22 ENCOUNTER — Ambulatory Visit (INDEPENDENT_AMBULATORY_CARE_PROVIDER_SITE_OTHER): Payer: Medicare HMO | Admitting: Cardiology

## 2021-04-22 VITALS — BP 126/76 | HR 65 | Ht 70.0 in | Wt 205.4 lb

## 2021-04-22 DIAGNOSIS — I10 Essential (primary) hypertension: Secondary | ICD-10-CM

## 2021-04-22 DIAGNOSIS — I2510 Atherosclerotic heart disease of native coronary artery without angina pectoris: Secondary | ICD-10-CM

## 2021-04-22 DIAGNOSIS — R911 Solitary pulmonary nodule: Secondary | ICD-10-CM

## 2021-04-22 DIAGNOSIS — E785 Hyperlipidemia, unspecified: Secondary | ICD-10-CM | POA: Diagnosis not present

## 2021-04-22 DIAGNOSIS — I77819 Aortic ectasia, unspecified site: Secondary | ICD-10-CM

## 2021-04-22 DIAGNOSIS — I700 Atherosclerosis of aorta: Secondary | ICD-10-CM | POA: Diagnosis not present

## 2021-04-22 NOTE — Progress Notes (Signed)
Cardiology Office Note:    Date:  04/22/2021   ID:  Carlos David, DOB 12-01-1945, MRN [id number]  PCP:  Vivi Barrack, MD  Cardiologist:  None  Electrophysiologist:  None   Referring MD: Vivi Barrack, MD   Chief Complaint  Patient presents with  . Coronary Artery Disease    History of Present Illness:    Carlos David is a 75 y.o. male with a hx of thyroid cancer now hypothyroidism, hyperlipidemia who presents for follow-up.  He was referred by Carlos Coke, PA-C for evaluation of chest pain, initially seen on 01/19/2020.  He reported that he has been having chest pain for the preceding few months.  Occured daily.  Chest pain is located under his left breast.  Describes as pinching pain.  Can last for few minutes.  Typically occurs at rest.  Reports that he exercises at the [facility], will do the elliptical for 30 minutes.  Has not noted exertional chest pain.  Does report some lightheadedness with standing.  Denies any syncope, palpitations, lower extremity edema.  Smoked cigarettes as teenager for few years.  Smoked pipe for 10 years.  Mother had MI in 57s.    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  At last clinic visit, he reports that he is doing well.  He denies any further chest pain.  Reports he has been going to the gym 3 to 4 days/week.  Will get elliptical for 30 minutes, ride bike for 30 minutes, and do weight machines.  He denies any exertional chest pain or dyspnea.  Does however report he has some dyspnea when walking up hills.  He denies any lightheadedness, syncope, lower extremity edema, or palpitations.  Brought his BP log with him, has been 130s to 140s over 80s to  90s.  Since last clinic visit, he has been feeling well overall. He has not had any more episodes of chest pain. At home, his blood pressure averages in the 120s-130s/80s according to his log. His main complaint today is pain due to arthritis. When walking on his inclined driveway, he feels a little short of breath, and notes that he feels like his breathing rate is too slow or temporarily stops during these times. There are no other exertional symptoms of note. He continues to be active, and has been working in the garden regularly. Usually he goes to the [facility] 3-4 times a week but has slowed down with this in favor of gardening in the summer months. Also, he tries to do some morning stretches every day. Typically his regimen includes meloxicam once a day, and tylenol every morning for his arthritis. He denies any palpitations, headaches, lightheadedness, or syncope. Also has no lower extremity edema, orthopnea or PND.    Past Medical History:  Diagnosis Date  . Actinic keratosis 12/16/2010   Qualifier: Diagnosis of  By: [name]  MD, [name]   . BPH (benign prostatic hyperplasia)   . Cancer ([place])    thyroid cancer 1990  . Cataract    both eyes  . History of radiation therapy 1990  . HYPERLIPIDEMIA 12/26/2008  . HYPOTHYROIDISM 12/26/2008  . PPD positive   . THYROID CANCER, HX OF 1990  . THYROID  CANCER, HX OF 12/26/2008   Qualifier: Diagnosis of  By: [name]  MD, [name]     Past Surgical History:  Procedure Laterality Date  . CATARACT EXTRACTION, BILATERAL  2020  . COLONOSCOPY     10-ye ago-normal at [facility]  . EYE SURGERY     as child  . HAND SURGERY     right hand trigger figger   . neckotomy  1990   20 lymph nodes removed from left neck  . TOTAL THYROIDECTOMY  1990  . TRANSURETHRAL RESECTION OF PROSTATE N/A 01/21/2019   Procedure: TRANSURETHRAL RESECTION OF THE PROSTATE (TURP);  Surgeon: [name], MD;  Location: [facility];  Service: Urology;  Laterality:  N/A;  75 MINS    Current Medications: Current Meds  Medication Sig  . atorvastatin (LIPITOR) 40 MG tablet TAKE 1 TABLET BY MOUTH EVERY DAY  . cyclobenzaprine (FLEXERIL) 10 MG tablet   . finasteride (PROSCAR) 5 MG tablet TAKE 1 TABLET BY MOUTH EVERY DAY  . gabapentin (NEURONTIN) 300 MG capsule Take 300 mg by mouth 2 (two) times daily.  . Glucosamine-Chondroit-Vit C-Mn (GLUCOSAMINE 1500 COMPLEX PO) Take by mouth.  . hydrocortisone 2.5 % cream   . levothyroxine (SYNTHROID) 137 MCG tablet TAKE 1 TABLET BY MOUTH EVERY DAY  . losartan (COZAAR) 100 MG tablet Take 1 tablet (100 mg total) by mouth daily.  . meloxicam (MOBIC) 15 MG tablet TAKE 1 TABLET (15 MG TOTAL) BY MOUTH DAILY.  . NONFORMULARY OR COMPOUNDED ITEM Antifungal gel  :Urea gel 40%   Order faxed to [place] (Patient taking differently: Antifungal gel  :Urea gel 40%   Order faxed to [employer])  . sildenafil (REVATIO) 20 MG tablet TAKE 1 TABLET (20 MG TOTAL) BY MOUTH 3 (THREE) TIMES DAILY AS NEEDED.  Marland Kitchen triamcinolone cream (KENALOG) 0.1 % Apply topically.     Allergies:   Penicillins   Social History   Socioeconomic History  . Marital status: Married    Spouse name: Not on file  . Number of children: Not on file  . Years of education: Not on file  . Highest education level: Not on file  Occupational History  . Not on file  Tobacco Use  . Smoking status: Former Smoker    Types: Cigarettes, Pipe    Quit date: 11/17/1968    Years since quitting: 52.4  . Smokeless tobacco: Never Used  Vaping Use  . Vaping Use: Never used  Substance and Sexual Activity  . Alcohol use: Yes    Alcohol/week: 0.0 standard drinks    Comment: occas. beer  . Drug use: No  . Sexual activity: Not on file  Other Topics Concern  . Not on file  Social History Narrative  . Not on file   Social Determinants of Health   Financial Resource Strain: Low Risk   . Difficulty of Paying Living Expenses: Not hard at all  Food  Insecurity: No Food Insecurity  . Worried About Charity fundraiser in the Last Year: Never true  . Ran Out of Food in the Last Year: Never true  Transportation Needs: No Transportation Needs  . Lack of Transportation (Medical): No  . Lack of Transportation (Non-Medical): No  Physical Activity: Sufficiently Active  . Days of Exercise per Week: 3 days  . Minutes of Exercise per Session: 120 min  Stress: No Stress Concern Present  . Feeling of Stress : Not at all  Social Connections: Moderately Integrated  . Frequency of Communication  with Friends and Family: More than three times a week  . Frequency of Social Gatherings with Friends and Family: More than three times a week  . Attends Religious Services: More than 4 times per year  . Active Member of Clubs or Organizations: No  . Attends Archivist Meetings: Never  . Marital Status: Married     Family History: The patient's family history is negative for Colon cancer.  ROS:   Please see the history of present illness.    (+) Myalgias/Joint pain due to Arthritis (+) Shortness of breath All other systems reviewed and are negative.  EKGs/Labs/Other Studies Reviewed:    The following studies were reviewed today:   EKG:   04/22/2021: NSR, rate 65, No ST abnormalities 01/19/2020: normal sinus rhythm, rate 69, left axis deviation, no ST/T abnormalities  Lexiscan Myoview 11/28/16:  Nuclear stress EF: 54%.  The study is normal.  This is a low risk study.  The left ventricular ejection fraction is mildly decreased (45-54%).  There was no ST segment deviation noted during stress.   Normal resting and stress perfusion. No ischemia or infarction EF 54%  Coronary CTA 03/15/20: IMPRESSION: 1. Coronary calcium score of 526. This was 14 percentile for age and sex matched control.  2. Normal coronary origin with right dominance.  3. CAD-RADS 3. Mild stenosis in the proximal and moderate stenosis in the mid LAD.  Consider symptom-guided anti-ischemic pharmacotherapy as well as risk factor modification per guideline directed care. Additional analysis with CT FFR will be submitted.  4. 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 No dissection.  5. Mildly dilated pulmonary artery measuring 33 mm.  1. Left Main: 0.99.  2. LAD: Proximal: 0.96, mid: 0.86, distal: 0.83. 3. LCX: 0.94 4. OM1: 0.96. 5. OM2: 9.97. 6. RCA: Proximal: 0.98, distal: 0.96.  IMPRESSION: 1. CT FFR analysis didn't show any significant stenosis. Aggressive medical management is recommended.   IMPRESSION: 1.  No acute findings in the imaged extracardiac chest. 2.  Aortic Atherosclerosis (ICD10-I70.0).  TTE 02/06/20: 1. Left ventricular ejection fraction, by estimation, is 60 to 65%. The  left ventricle has normal function. The left ventricle has no regional  wall motion abnormalities. There is mild asymmetric left ventricular  hypertrophy. Left ventricular diastolic  parameters are indeterminate.  2. Right ventricular systolic function is normal. The right ventricular  size is normal.  3. The mitral valve is normal in structure. Mild mitral valve  regurgitation. No evidence of mitral stenosis.  4. The aortic valve is normal in structure. Aortic valve regurgitation is  not visualized. No aortic stenosis is present.  5. Aortic dilatation noted. There is mild to moderate dilatation of the  ascending aorta measuring 44 mm.   CT FFR 03/15/2020: FINDINGS: FFRct analysis was performed on the original cardiac CT angiogram dataset. Diagrammatic representation of the FFRct analysis is provided in a separate PDF document in PACS. This dictation was created using the PDF document and an interactive 3D model of the results. 3D model is not available in the EMR/PACS. Normal FFR range is >0.80.  1. Left Main: 0.99. 2. LAD: Proximal: 0.96, mid: 0.86, distal: 0.83. 3. LCX: 0.94 4. OM1: 0.96. 5. OM2:  9.97. 6. RCA: Proximal: 0.98, distal: 0.96.  IMPRESSION: 1. CT FFR analysis didn't show any significant stenosis. Aggressive medical management is recommended.  CT Angiography Chest 03/13/2021: IMPRESSION: 1. 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 Recommend annual imaging followup by  CTA or MRA. This recommendation follows 2010 ACCF/AHA/AATS/ACR/ASA/SCA/SCAI/SIR/STS/SVM Guidelines for the Diagnosis and Management of Patients with Thoracic Aortic Disease. Circulation. 2010; 121: U981-X914. Aortic aneurysm NOS (ICD10-I71.9) 2. No acute chest abnormality. 3. Small pleural-based nodular density in the right upper lobe measuring roughly 5 mm. This is indeterminate. No follow-up needed if patient is low-risk. Non-contrast chest CT can be considered in 12 months if patient is high-risk. This recommendation follows the consensus statement: Guidelines for Management of Incidental Pulmonary Nodules Detected on CT Images: From the Fleischner Society 2017; Radiology 2017; 284:228-243. 4. Evidence for small tracheal diverticula. Recommend attention on follow-up imaging.  Recent Labs: 02/22/2021: BUN 24; Creatinine, Ser 1.17; Potassium 4.8; Sodium 137  Recent Lipid Panel    Component Value Date/Time   CHOL 137 07/05/2020 0823   TRIG 75 07/05/2020 0823   HDL 54 07/05/2020 0823   CHOLHDL 2.5 07/05/2020 0823   CHOLHDL 3 07/06/2019 0859   VLDL 16.6 07/06/2019 0859   LDLCALC 68 07/05/2020 0823    Physical Exam:    VS:  BP 126/76   Pulse 65   Ht 5\' 10"  (1.778 m)   Wt 205 lb 6.4 oz (93.2 kg)   SpO2 97%   BMI 29.47 kg/m     Wt Readings from Last 3 Encounters:  04/22/21 205 lb 6.4 oz (93.2 kg)  10/23/20 206 lb 6.4 oz (93.6 kg)  07/09/20 204 lb (92.5 kg)     GEN:  Well nourished, well developed in no acute distress HEENT: Normal NECK: No JVD; No carotid bruits LYMPHATICS: No lymphadenopathy CARDIAC: RRR, no murmurs, rubs,  gallops RESPIRATORY:  Clear to auscultation without rales, wheezing or rhonchi  ABDOMEN: Soft, non-tender, non-distended MUSCULOSKELETAL:  No edema; No deformity  SKIN: Warm and dry NEUROLOGIC:  Alert and oriented x 3 PSYCHIATRIC:  Normal affect   ASSESSMENT:    1. CAD in native artery   2. 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 Essential hypertension   4. Hyperlipidemia, unspecified hyperlipidemia type   5. Pulmonary nodule    PLAN:     CAD: Presented with atypical chest pain.  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  He reports chest pain has resolved. -Continue atorvastatin 40 mg daily. -Discussed daily aspirin, he would prefer to hold off at this time  Hypertension: On losartan 100 mg daily.  Appears controlled on  Hyperlipidemia: Continue atorvastatin 40 mg daily.  LDL 68 on 07/05/2020, at goal less than 70  Aortic dilatation: ascending aorta measures 15mm on CTA.  Repeat CTA on 02/2021 showed stable ascending aortic dilatation measuring 40 mm.  Pulmonary nodule: 5 mm right upper lobe nodule noted on CTA chest 03/13/2021.  Follow-up CT chest in 1 year  RTC in 1 year  Medication Adjustments/Labs and Tests Ordered: Current medicines are reviewed at length with the patient today.  Concerns regarding medicines are outlined above.  Orders Placed This Encounter  Procedures  . EKG 12-Lead   No orders of the defined types were placed in this encounter.   Patient Instructions  Medication Instructions:  Your physician recommends that you continue on your current medications as directed. Please refer to the Current Medication list given to you today.  *If you need a refill on your cardiac medications before your next appointment, please call your pharmacy*  Follow-Up: At [facility], you and your  health needs are  our priority.  As part of our continuing mission to provide you with exceptional heart care, we have created designated Provider Care Teams.  These Care Teams include your primary Cardiologist (physician) and Advanced Practice Providers (APPs -  Physician Assistants and Nurse Practitioners) who all work together to provide you with the care you need, when you need it.  We recommend signing up for the patient portal called "MyChart".  Sign up information is provided on this After Visit Summary.  MyChart is used to connect with patients for Virtual Visits (Telemedicine).  Patients are able to view lab/test results, encounter notes, upcoming appointments, etc.  Non-urgent messages can be sent to your provider as well.   To learn more about what you can do with MyChart, go to [email or web address].    Your next appointment:   12 month(s)  The format for your next appointment:   In Person  Provider:   Oswaldo Milian, MD       [facility],acting as a scribe for Donato Heinz, MD.,have documented all relevant documentation on the behalf of Donato Heinz, MD,as directed by  Donato Heinz, MD while in the presence of Donato Heinz, MD.  I, Donato Heinz, MD, have reviewed all documentation for this visit. The documentation on 04/22/21 for the exam, diagnosis, procedures, and orders are all accurate and complete.   Signed, Donato Heinz, MD  04/22/2021 5:22 PM    [facility]

## 2021-04-22 NOTE — Patient Instructions (Signed)

## 2021-04-25 ENCOUNTER — Telehealth: Payer: Self-pay | Admitting: Family Medicine

## 2021-04-25 NOTE — Progress Notes (Signed)
  Chronic Care Management   Outreach Note  04/25/2021 Name: Carlos David MRN: [id number] DOB: Oct 15, 1946  Referred by: Vivi Barrack, MD Reason for referral : No chief complaint on file.   An unsuccessful telephone outreach was attempted today. The patient was referred to the pharmacist for assistance with care management and care coordination.   Follow Up Plan:   Lauretta Grill Upstream Scheduler

## 2021-04-26 DIAGNOSIS — N5201 Erectile dysfunction due to arterial insufficiency: Secondary | ICD-10-CM | POA: Diagnosis not present

## 2021-04-26 DIAGNOSIS — N401 Enlarged prostate with lower urinary tract symptoms: Secondary | ICD-10-CM | POA: Diagnosis not present

## 2021-04-26 DIAGNOSIS — R3914 Feeling of incomplete bladder emptying: Secondary | ICD-10-CM | POA: Diagnosis not present

## 2021-06-06 ENCOUNTER — Telehealth: Payer: Self-pay | Admitting: Family Medicine

## 2021-06-06 NOTE — Chronic Care Management (AMB) (Signed)
  Chronic Care Management   Note  06/06/2021 Name: Carlos David MRN: [id number] DOB: 10-Mar-1946  Carlos David is a 75 y.o. year old male who is a primary care patient of Vivi Barrack, MD. I reached out to Isabell Jarvis by phone today in response to a referral sent by Carlos David's PCP, Vivi Barrack, MD.   Carlos David was given information about Chronic Care Management services today including:  CCM service includes personalized support from designated clinical staff supervised by his physician, including individualized plan of care and coordination with other care providers 24/7 contact phone numbers for assistance for urgent and routine care needs. Service will only be billed when office clinical staff spend 20 minutes or more in a month to coordinate care. Only one practitioner may furnish and bill the service in a calendar month. The patient may stop CCM services at any time (effective at the end of the month) by phone call to the office staff.   Patient agreed to services and verbal consent obtained.   Follow up plan:   Lauretta Grill Upstream Scheduler

## 2021-06-10 ENCOUNTER — Encounter: Payer: Self-pay | Admitting: Family Medicine

## 2021-06-10 ENCOUNTER — Telehealth: Payer: Self-pay

## 2021-06-10 NOTE — Telephone Encounter (Signed)
Patient would like to know if he needs to come in prior to CPE appt 8/24 to be seen for any follow ups prior to CPE.    Please follow up in regard.

## 2021-06-11 DIAGNOSIS — L905 Scar conditions and fibrosis of skin: Secondary | ICD-10-CM | POA: Diagnosis not present

## 2021-06-11 DIAGNOSIS — L570 Actinic keratosis: Secondary | ICD-10-CM | POA: Diagnosis not present

## 2021-06-11 DIAGNOSIS — L821 Other seborrheic keratosis: Secondary | ICD-10-CM | POA: Diagnosis not present

## 2021-06-11 DIAGNOSIS — Z85820 Personal history of malignant melanoma of skin: Secondary | ICD-10-CM | POA: Diagnosis not present

## 2021-06-11 DIAGNOSIS — L814 Other melanin hyperpigmentation: Secondary | ICD-10-CM | POA: Diagnosis not present

## 2021-06-11 DIAGNOSIS — D1801 Hemangioma of skin and subcutaneous tissue: Secondary | ICD-10-CM | POA: Diagnosis not present

## 2021-06-11 DIAGNOSIS — D225 Melanocytic nevi of trunk: Secondary | ICD-10-CM | POA: Diagnosis not present

## 2021-06-21 DIAGNOSIS — M5416 Radiculopathy, lumbar region: Secondary | ICD-10-CM | POA: Diagnosis not present

## 2021-07-10 ENCOUNTER — Encounter: Payer: Self-pay | Admitting: Family Medicine

## 2021-07-10 ENCOUNTER — Other Ambulatory Visit: Payer: Self-pay

## 2021-07-10 ENCOUNTER — Ambulatory Visit (INDEPENDENT_AMBULATORY_CARE_PROVIDER_SITE_OTHER): Payer: Medicare HMO | Admitting: Family Medicine

## 2021-07-10 VITALS — BP 142/81 | HR 57 | Temp 98.0°F | Ht 70.0 in | Wt 200.0 lb

## 2021-07-10 DIAGNOSIS — N400 Enlarged prostate without lower urinary tract symptoms: Secondary | ICD-10-CM

## 2021-07-10 DIAGNOSIS — I10 Essential (primary) hypertension: Secondary | ICD-10-CM

## 2021-07-10 DIAGNOSIS — N529 Male erectile dysfunction, unspecified: Secondary | ICD-10-CM

## 2021-07-10 DIAGNOSIS — E785 Hyperlipidemia, unspecified: Secondary | ICD-10-CM

## 2021-07-10 DIAGNOSIS — Z0001 Encounter for general adult medical examination with abnormal findings: Secondary | ICD-10-CM

## 2021-07-10 DIAGNOSIS — R739 Hyperglycemia, unspecified: Secondary | ICD-10-CM

## 2021-07-10 DIAGNOSIS — E039 Hypothyroidism, unspecified: Secondary | ICD-10-CM

## 2021-07-10 DIAGNOSIS — M545 Low back pain, unspecified: Secondary | ICD-10-CM

## 2021-07-10 DIAGNOSIS — M150 Primary generalized (osteo)arthritis: Secondary | ICD-10-CM

## 2021-07-10 DIAGNOSIS — M8949 Other hypertrophic osteoarthropathy, multiple sites: Secondary | ICD-10-CM | POA: Diagnosis not present

## 2021-07-10 DIAGNOSIS — G8929 Other chronic pain: Secondary | ICD-10-CM

## 2021-07-10 DIAGNOSIS — M159 Polyosteoarthritis, unspecified: Secondary | ICD-10-CM

## 2021-07-10 LAB — CBC
HCT: 40.1 % (ref 39.0–52.0)
Hemoglobin: 13.6 g/dL (ref 13.0–17.0)
MCHC: 33.9 g/dL (ref 30.0–36.0)
MCV: 90 fl (ref 78.0–100.0)
Platelets: 164 10*3/uL (ref 150.0–400.0)
RBC: 4.46 Mil/uL (ref 4.22–5.81)
RDW: 13.6 % (ref 11.5–15.5)
WBC: 6.2 10*3/uL (ref 4.0–10.5)

## 2021-07-10 LAB — COMPREHENSIVE METABOLIC PANEL
ALT: 23 U/L (ref 0–53)
AST: 27 U/L (ref 0–37)
Albumin: 4.2 g/dL (ref 3.5–5.2)
Alkaline Phosphatase: 86 U/L (ref 39–117)
BUN: 27 mg/dL — ABNORMAL HIGH (ref 6–23)
CO2: 26 mEq/L (ref 19–32)
Calcium: 9 mg/dL (ref 8.4–10.5)
Chloride: 104 mEq/L (ref 96–112)
Creatinine, Ser: 1.15 mg/dL (ref 0.40–1.50)
GFR: 61.86 mL/min (ref >60.00)
Glucose, Bld: 84 mg/dL (ref 70–99)
Potassium: 4.4 mEq/L (ref 3.5–5.1)
Sodium: 137 mEq/L (ref 135–145)
Total Bilirubin: 0.6 mg/dL (ref 0.2–1.2)
Total Protein: 6.9 g/dL (ref 6.0–8.3)

## 2021-07-10 LAB — LIPID PANEL
Cholesterol: 138 mg/dL (ref 0–200)
HDL: 64.1 mg/dL (ref >39.00)
LDL Cholesterol: 61 mg/dL (ref 0–99)
NonHDL: 73.52
Total CHOL/HDL Ratio: 2
Triglycerides: 62 mg/dL (ref 0.0–149.0)
VLDL: 12.4 mg/dL (ref 0.0–40.0)

## 2021-07-10 LAB — HEMOGLOBIN A1C: Hgb A1c MFr Bld: 6.1 % (ref 4.6–6.5)

## 2021-07-10 LAB — TSH: TSH: 1.7 u[IU]/mL (ref 0.35–5.50)

## 2021-07-10 NOTE — Assessment & Plan Note (Signed)
On Lipitor 40 mg daily.  Check labs today.

## 2021-07-10 NOTE — Assessment & Plan Note (Signed)
Check TSH.  On Synthroid 137 mcg daily.

## 2021-07-10 NOTE — Patient Instructions (Signed)
It was very nice to see you today!  We will check blood work today.  Please continue working on diet and exercise.  I will see back in year for your next physical.  Come back to see me sooner if needed.  Take care, Dr Jerline Pain  PLEASE NOTE:  If you had any lab tests please let us know if you have not heard back within a few days. You may see your results on mychart before we have a chance to review them but we will give you a call once they are reviewed by [place]. If we ordered any referrals today, please let us know if you have not heard from their office within the next week.   Please try these tips to maintain a healthy lifestyle:  Eat at least 3 REAL meals and 1-2 snacks per day.  Aim for no more than 5 hours between eating.  If you eat breakfast, please do so within one hour of getting up.   Each meal should contain half fruits/vegetables, one quarter protein, and one quarter carbs (no bigger than a computer mouse)  Cut down on sweet beverages. This includes juice, soda, and sweet tea.   Drink at least 1 glass of water with each meal and aim for at least 8 glasses per day  Exercise at least 150 minutes every week.    Preventive Care 2 Years and Older, Male Preventive care refers to lifestyle choices and visits with your health care provider that can promote health and wellness. This includes: A yearly physical exam. This is also called an annual wellness visit. Regular dental and eye exams. Immunizations. Screening for certain conditions. Healthy lifestyle choices, such as: Eating a healthy diet. Getting regular exercise. Not using drugs or products that contain nicotine and tobacco. Limiting alcohol use. What can I expect for my preventive care visit? Physical exam Your health care provider will check your: Height and weight. These may be used to calculate your BMI (body mass index). BMI is a measurement that tells if you are at a healthy weight. Heart rate and blood  pressure. Body temperature. Skin for abnormal spots. Counseling Your health care provider may ask you questions about your: Past medical problems. Family's medical history. Alcohol, tobacco, and drug use. Emotional well-being. Home life and relationship well-being. Sexual activity. Diet, exercise, and sleep habits. History of falls. Memory and ability to understand (cognition). Work and work Statistician. Access to firearms. What immunizations do I need?  Vaccines are usually given at various ages, according to a schedule. Your health care provider will recommend vaccines for you based on your age, medicalhistory, and lifestyle or other factors, such as travel or where you work. What tests do I need? Blood tests Lipid and cholesterol levels. These may be checked every 5 years, or more often depending on your overall health. Hepatitis C test. Hepatitis B test. Screening Lung cancer screening. You may have this screening every year starting at age 58 if you have a 30-pack-year history of smoking and currently smoke or have quit within the past 15 years. Colorectal cancer screening. All adults should have this screening starting at age 4 and continuing until age 16. Your health care provider may recommend screening at age 16 if you are at increased risk. You will have tests every 1-10 years, depending on your results and the type of screening test. Prostate cancer screening. Recommendations will vary depending on your family history and other risks. Genital exam to check for testicular cancer  or hernias. Diabetes screening. This is done by checking your blood sugar (glucose) after you have not eaten for a while (fasting). You may have this done every 1-3 years. Abdominal aortic aneurysm (AAA) screening. You may need this if you are a current or former smoker. STD (sexually transmitted disease) testing, if you are at risk. Follow these instructions at home: Eating and  drinking  Eat a diet that includes fresh fruits and vegetables, whole grains, lean protein, and low-fat dairy products. Limit your intake of foods with high amounts of sugar, saturated fats, and salt. Take vitamin and mineral supplements as recommended by your health care provider. Do not drink alcohol if your health care provider tells you not to drink. If you drink alcohol: Limit how much you have to 0-2 drinks a day. Be aware of how much alcohol is in your drink. In the [place] one drink equals one 12 oz bottle of beer (355 mL), one 5 oz glass of wine (148 mL), or one 1 oz glass of hard liquor (44 mL).  Lifestyle Take daily care of your teeth and gums. Brush your teeth every morning and night with fluoride toothpaste. Floss one time each day. Stay active. Exercise for at least 30 minutes 5 or more days each week. Do not use any products that contain nicotine or tobacco, such as cigarettes, e-cigarettes, and chewing tobacco. If you need help quitting, ask your health care provider. Do not use drugs. If you are sexually active, practice safe sex. Use a condom or other form of protection to prevent STIs (sexually transmitted infections). Talk with your health care provider about taking a low-dose aspirin or statin. Find healthy ways to cope with stress, such as: Meditation, yoga, or listening to music. Journaling. Talking to a trusted person. Spending time with friends and family. Safety Always wear your seat belt while driving or riding in a vehicle. Do not drive: If you have been drinking alcohol. Do not ride with someone who has been drinking. When you are tired or distracted. While texting. Wear a helmet and other protective equipment during sports activities. If you have firearms in your house, make sure you follow all gun safety procedures. What's next? Visit your health care provider once a year for an annual wellness visit. Ask your health care provider how often you should have  your eyes and teeth checked. Stay up to date on all vaccines. This information is not intended to replace advice given to you by your health care provider. Make sure you discuss any questions you have with your healthcare provider. Document Revised: 08/02/2019 Document Reviewed: 10/28/2018 Elsevier Patient Education  2022 [employer].

## 2021-07-10 NOTE — Progress Notes (Signed)
Chief Complaint:  Carlos David is a 75 y.o. male who presents today for his annual comprehensive physical exam.    Assessment/Plan:  Chronic Problems Addressed Today: Dyslipidemia On Lipitor 40 mg daily.  Check labs today.  Hypothyroidism s/p thyroidectomy 1990 due to thyroid cancer Check TSH.  On Synthroid 137 mcg daily.  BPH s/p TURP 2020 Follows with urology.  On finasteride and sildenafil.  Osteoarthritis Uses Voltaren gel as needed.  Also takes meloxicam 15 mg daily as needed.  Erectile dysfunction Managed by urology.  On sildenafil as needed.  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  Has been getting epidural steroid injections.  Preventative Healthcare: Check Labs.  Due for next colonoscopy in 2024 per GI.  Up-to-date on vaccines.  Patient Counseling(The following topics were reviewed and/or handout was given):  -Nutrition: Stressed importance of moderation in sodium/caffeine intake, saturated fat and cholesterol, caloric balance, sufficient intake of fresh fruits, vegetables, and fiber.  -Stressed the importance of regular exercise.   -Substance Abuse: Discussed cessation/primary prevention of tobacco, alcohol, or other drug use; driving or other dangerous activities under the influence; availability of treatment for abuse.   -Injury prevention: Discussed safety belts, safety helmets, smoke detector, smoking near bedding or upholstery.   -Sexuality: Discussed sexually transmitted diseases, partner selection, use of condoms, avoidance of unintended pregnancy and contraceptive alternatives.   -Dental health: Discussed importance of regular tooth brushing, flossing, and dental visits.  -Health maintenance and immunizations reviewed. Please refer to Health maintenance section.  Return to care in 1 year for next preventative visit.     Subjective:  HPI:  He has no acute complaints today.    Lifestyle Diet:  Reasonably healthy diet, is improving with homegrown vegetables and tomatoes. Exercise: He has been trying to go to [facility] for exercise, but has been unable to. Has a backyard fruit/vegetable garden which keeps him busy.  Depression screen PHQ 2/9 07/10/2021  Decreased Interest 0  Down, Depressed, Hopeless 0  PHQ - 2 Score 0    Health Maintenance Due  Topic Date Due   COVID-19 Vaccine (4 - Booster for Pfizer series) 01/28/2021     ROS: Per HPI, otherwise a complete review of systems was negative.   PMH:  The following were reviewed and entered/updated in epic: Past Medical History:  Diagnosis Date   Actinic keratosis 12/16/2010   Qualifier: Diagnosis of  By: [name]  MD, [name]    BPH (benign prostatic hyperplasia)    Cancer [facility])    thyroid cancer 1990   Cataract    both eyes   History of radiation therapy 1990   HYPERLIPIDEMIA 12/26/2008   HYPOTHYROIDISM 12/26/2008   PPD positive    THYROID CANCER, HX OF 1990   THYROID CANCER, HX OF 12/26/2008   Qualifier: Diagnosis of  By: [name]  MD, [name]    Patient Active Problem List   Diagnosis Date Noted   Essential hypertension 12/17/2018   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 12/17/2018   Erectile dysfunction 12/17/2018   BPH s/p TURP 2020 03/02/2014   Osteoarthritis 07/16/2012   Hypothyroidism s/p thyroidectomy 1990 due to thyroid cancer 12/26/2008   Dyslipidemia 12/26/2008   Past Surgical History:  Procedure Laterality Date   CATARACT EXTRACTION, BILATERAL  2020   COLONOSCOPY     10-ye ago-normal at [facility]   EYE SURGERY     as child   HAND SURGERY  right hand trigger figger    neckotomy  1990   20 lymph nodes removed from left neck   TOTAL THYROIDECTOMY  1990   TRANSURETHRAL RESECTION OF PROSTATE N/A 01/21/2019   Procedure: TRANSURETHRAL RESECTION OF THE PROSTATE (TURP);  Surgeon: [name], MD;  Location: [facility];  Service: Urology;  Laterality: N/A;   59 MINS    Family History  Problem Relation Age of Onset   Colon cancer Neg Hx     Medications- reviewed and updated Current Outpatient Medications  Medication Sig Dispense Refill   atorvastatin (LIPITOR) 40 MG tablet TAKE 1 TABLET BY MOUTH EVERY DAY 90 tablet 1   finasteride (PROSCAR) 5 MG tablet TAKE 1 TABLET BY MOUTH EVERY DAY 90 tablet 1   gabapentin (NEURONTIN) 300 MG capsule Take 300 mg by mouth 2 (two) times daily.     Glucosamine-Chondroit-Vit C-Mn (GLUCOSAMINE 1500 COMPLEX PO) Take by mouth.     hydrocortisone 2.5 % cream      levothyroxine (SYNTHROID) 137 MCG tablet TAKE 1 TABLET BY MOUTH EVERY DAY 90 tablet 3   losartan (COZAAR) 100 MG tablet Take 1 tablet (100 mg total) by mouth daily. 90 tablet 3   meloxicam (MOBIC) 15 MG tablet TAKE 1 TABLET (15 MG TOTAL) BY MOUTH DAILY. 30 tablet 0   NONFORMULARY OR COMPOUNDED ITEM Antifungal gel  :Urea gel 40%   Order faxed to [place] (Patient taking differently: Antifungal gel  :Urea gel 40%   Order faxed to [employer])     sildenafil (REVATIO) 20 MG tablet TAKE 1 TABLET (20 MG TOTAL) BY MOUTH 3 (THREE) TIMES DAILY AS NEEDED. 50 tablet 1   triamcinolone cream (KENALOG) 0.1 % Apply topically.     No current facility-administered medications for this visit.    Allergies-reviewed and updated Allergies  Allergen Reactions   Penicillins Hives    Social History   Socioeconomic History   Marital status: Married    Spouse name: Not on file   Number of children: Not on file   Years of education: Not on file   Highest education level: Not on file  Occupational History   Not on file  Tobacco Use   Smoking status: Former    Types: Cigarettes, Pipe    Quit date: 11/17/1968    Years since quitting: 52.6   Smokeless tobacco: Never  Vaping Use   Vaping Use: Never used  Substance and Sexual Activity   Alcohol use: Yes    Alcohol/week: 0.0 standard drinks    Comment: occas. beer   Drug use: No   Sexual  activity: Not on file  Other Topics Concern   Not on file  Social History Narrative   Not on file   Social Determinants of Health   Financial Resource Strain: Low Risk    Difficulty of Paying Living Expenses: Not hard at all  Food Insecurity: No Food Insecurity   Worried About Running Out of Food in the Last Year: Never true   [place] in the Last Year: Never true  Transportation Needs: No Transportation Needs   Lack of Transportation (Medical): No   Lack of Transportation (Non-Medical): No  Physical Activity: Sufficiently Active   Days of Exercise per Week: 3 days   Minutes of Exercise per Session: 120 min  Stress: No Stress Concern Present   Feeling of Stress : Not at all  Social Connections: Moderately Integrated   Frequency of Communication with Friends and Family: More than  three times a week   Frequency of Social Gatherings with Friends and Family: More than three times a week   Attends Religious Services: More than 4 times per year   Active Member of Clubs or Organizations: No   Attends Music therapist: Never   Marital Status: Married        Objective:  Physical Exam: BP (!) 142/81   Pulse (!) 57   Temp 98 F (36.7 C) (Temporal)   Ht '5\' 10"'$  (1.778 m)   Wt 200 lb (90.7 kg)   SpO2 98%   BMI 28.70 kg/m   Body mass index is 28.7 kg/m. Wt Readings from Last 3 Encounters:  07/10/21 200 lb (90.7 kg)  04/22/21 205 lb 6.4 oz (93.2 kg)  10/23/20 206 lb 6.4 oz (93.6 kg)   Gen: NAD, resting comfortably HEENT: TMs normal bilaterally. OP clear. No thyromegaly noted.  CV: RRR with no murmurs appreciated Pulm: NWOB, CTAB with no crackles, wheezes, or rhonchi GI: Normal bowel sounds present. Soft, Nontender, Nondistended. MSK: no edema, cyanosis, or clubbing noted Skin: warm, dry Neuro: CN2-12 grossly intact. Strength 5/5 in upper and lower extremities. Reflexes symmetric and intact bilaterally.  Psych: Normal affect and thought content      I,Jordan Kelly,acting as a scribe for Dimas Chyle, MD.,have documented all relevant documentation on the behalf of Dimas Chyle, MD,as directed by  Dimas Chyle, MD while in the presence of Dimas Chyle, MD.  I, Dimas Chyle, MD, have reviewed all documentation for this visit. The documentation on 07/10/21 for the exam, diagnosis, procedures, and orders are all accurate and complete.   Carlos David. Jerline Pain, MD 07/10/2021 10:26 AM

## 2021-07-10 NOTE — Assessment & Plan Note (Signed)
Follows with urology.  On finasteride and sildenafil.

## 2021-07-10 NOTE — Assessment & Plan Note (Signed)
Managed by urology.  On sildenafil as needed.

## 2021-07-10 NOTE — Assessment & Plan Note (Signed)
Follows with neurosurgery.  Has been getting epidural steroid injections.

## 2021-07-10 NOTE — Assessment & Plan Note (Signed)
Uses Voltaren gel as needed.  Also takes meloxicam 15 mg daily as needed.

## 2021-07-11 NOTE — Progress Notes (Signed)
Please inform patient of the following:  Good news! Labs are all stable. Do not need to make any changes to his treatment plan at this time. He should continue working on diet and exercise and we can recheck in a year.  Algis Greenhouse. Jerline Pain, MD 07/11/2021 9:49 AM

## 2021-07-12 ENCOUNTER — Telehealth: Payer: Self-pay | Admitting: Pharmacist

## 2021-07-12 NOTE — Chronic Care Management (AMB) (Addendum)
Chronic Care Management Pharmacy Assistant   Name: YASIEL SATTERLEE  MRN: [id number] DOB: 1946-07-27  Carlos David is an 75 y.o. year old male who presents for his initial CCM visit with the clinical pharmacist.  Reason for Encounter: Chart Review For Initial Visit With Clinical Pharmacist   Conditions to be addressed/monitored: HTN, Hypothyroidism, OA, BPH, HLD, Low Back Pain, Erectile Dysfunction  Primary concerns for visit include: HTN, Hypothyroidism, HLD  Recent office visits:  07/10/2021 [facility] (PCP) Vivi Barrack, MD; chronic follow up, no medication changes indicated.  Recent consult visits:  04/22/2021 )V (cardiology) Donato Heinz, MD; follow up CAD, no medication changes indicated.  Hospital visits:  None in previous 6 months  Medications: Outpatient Encounter Medications as of 07/12/2021  Medication Sig   atorvastatin (LIPITOR) 40 MG tablet TAKE 1 TABLET BY MOUTH EVERY DAY   finasteride (PROSCAR) 5 MG tablet TAKE 1 TABLET BY MOUTH EVERY DAY   gabapentin (NEURONTIN) 300 MG capsule Take 300 mg by mouth 2 (two) times daily.   Glucosamine-Chondroit-Vit C-Mn (GLUCOSAMINE 1500 COMPLEX PO) Take by mouth.   hydrocortisone 2.5 % cream    levothyroxine (SYNTHROID) 137 MCG tablet TAKE 1 TABLET BY MOUTH EVERY DAY   losartan (COZAAR) 100 MG tablet Take 1 tablet (100 mg total) by mouth daily.   meloxicam (MOBIC) 15 MG tablet TAKE 1 TABLET (15 MG TOTAL) BY MOUTH DAILY.   NONFORMULARY OR COMPOUNDED ITEM Antifungal gel  :Urea gel 40%   Order faxed to [place] (Patient taking differently: Antifungal gel  :Urea gel 40%   Order faxed to [employer])   sildenafil (REVATIO) 20 MG tablet TAKE 1 TABLET (20 MG TOTAL) BY MOUTH 3 (THREE) TIMES DAILY AS NEEDED.   triamcinolone cream (KENALOG) 0.1 % Apply topically.   No facility-administered encounter medications on file as of 07/12/2021.    Current Medications: Sildenafil 30 mg Levothyroxine 137 mcg last  filled 03/29/2021 90 DS Atorvastatin 40 mg last filled 03/20/2021 90 DS Losartan 100 mg last filled 04/17/2021 90 DS Triamcinolone 0.1% cream- no longer using per patient Hydrocortisone 2.5% cream Glucosamine 1500 Complex Gabapentin 300 mg last filled 05/21/2021 90 DS Finasteride 5 mg last filled 04/14/2021 90 DS Meloxicam 15 mg last filled 05/29/2021 30 DS  Patient Questions: Any changes in your medications or health? Patient states he has not had any changes in his medications or health recently.  Any side effects from any medications?  Patient states he does not currently have any side effects with any of his medications.  Do you have any symptoms or problems not managed by your medications? Patient states he does not have any symptoms or problems that are not managed by his medications.  Any concerns about your health right now? Patient states he does not have any concerns about his health right now.  Has your provider asked that you check blood pressure, blood sugar, or follow special diet at home? Patient states he does check his blood pressure at home. He does not check his blood sugars. He follows a low salt diet.  Do you get any type of exercise on a regular basis? Patient states he goes to the [facility] 2-3 x/wk.  Can you think of a goal you would like to reach for your health? Patient states he would like to lose weight and live to be 89.  Do you have any problems getting your medications? Patient states he uses [facility] and has no problems getting his medications.  Is there anything that you would like to discuss during the appointment?  Patient states he would like to discuss his medications.  Please bring medications and supplements to appointment.  Future Appointments  Date Time Provider [place]  07/16/2021  2:00 PM [facility] CCM PHARMACIST [facility]  02/28/2022  1:00 PM [facility] HEALTH COACH [facility]  07/11/2022  8:00 AM Vivi Barrack, MD [facility]      Star Rating Drugs: Atorvastatin 40 mg last filled 03/20/2021 90 DS Losartan 100 mg last filled 04/17/2021 90 DS  April D Calhoun, [place] Pharmacist Assistant [phone]

## 2021-07-16 ENCOUNTER — Ambulatory Visit (INDEPENDENT_AMBULATORY_CARE_PROVIDER_SITE_OTHER): Payer: Medicare HMO | Admitting: Pharmacist

## 2021-07-16 DIAGNOSIS — E785 Hyperlipidemia, unspecified: Secondary | ICD-10-CM

## 2021-07-16 DIAGNOSIS — E039 Hypothyroidism, unspecified: Secondary | ICD-10-CM

## 2021-07-16 DIAGNOSIS — I10 Essential (primary) hypertension: Secondary | ICD-10-CM | POA: Diagnosis not present

## 2021-07-16 NOTE — Progress Notes (Signed)
Chronic Care Management Pharmacy Note  07/16/2021 Name:  Carlos David MRN:  [id number] DOB:  January 07, 1946  Summary: Initial visit with PharmD.  All meds reviewed/list updated.  Recommendations/Changes made from today's visit: none  Plan: FU when needed    Subjective: Carlos David is an 75 y.o. year old male who is a primary patient of Jerline Pain, Algis Greenhouse, MD.  The CCM team was consulted for assistance with disease management and care coordination needs.    Engaged with patient by telephone for initial visit in response to provider referral for pharmacy case management and/or care coordination services.   Consent to Services:  The patient was given the following information about Chronic Care Management services today, agreed to services, and gave verbal consent: 1. CCM service includes personalized support from designated clinical staff supervised by the primary care provider, including individualized plan of care and coordination with other care providers 2. 24/7 contact phone numbers for assistance for urgent and routine care needs. 3. Service will only be billed when office clinical staff spend 20 minutes or more in a month to coordinate care. 4. Only one practitioner may furnish and bill the service in a calendar month. 5.The patient may stop CCM services at any time (effective at the end of the month) by phone call to the office staff. 6. The patient will be responsible for cost sharing (co-pay) of up to 20% of the service fee (after annual deductible is met). Patient agreed to services and consent obtained.  Patient Care Team: Vivi Barrack, MD as PCP - General (Family Medicine) [name], MD as Consulting Physician (Urology) Marlaine Hind, MD as Consulting Physician (Physical Medicine and Rehabilitation) Rutherford Guys, MD as Consulting Physician (Ophthalmology) Consuella Lose, MD as Consulting Physician (Neurosurgery) Edythe Clarity, [facility] as Pharmacist  (Pharmacist)  Recent office visits:  07/10/2021 [facility] (PCP) Vivi Barrack, MD; chronic follow up, no medication changes indicated.   Recent consult visits:  04/22/2021 [facility] (cardiology) Donato Heinz, MD; follow up CAD, no medication changes indicated.   Hospital visits:  None in previous 6 months    Objective:  Lab Results  Component Value Date   CREATININE 1.15 07/10/2021   BUN 27 (H) 07/10/2021   GFR 61.86 07/10/2021   GFRNONAA 63 11/01/2020   GFRAA 73 11/01/2020   NA 137 07/10/2021   K 4.4 07/10/2021   CALCIUM 9.0 07/10/2021   CO2 26 07/10/2021   GLUCOSE 84 07/10/2021    Lab Results  Component Value Date/Time   HGBA1C 6.1 07/10/2021 10:31 AM   GFR 61.86 07/10/2021 10:31 AM   GFR 71.21 01/18/2020 02:43 PM    Last diabetic Eye exam: No results found for: HMDIABEYEEXA  Last diabetic Foot exam: No results found for: HMDIABFOOTEX   Lab Results  Component Value Date   CHOL 138 07/10/2021   HDL 64.10 07/10/2021   LDLCALC 61 07/10/2021   TRIG 62.0 07/10/2021   CHOLHDL 2 07/10/2021    Hepatic Function Latest Ref Rng & Units 07/10/2021 01/18/2020 07/06/2019  Total Protein 6.0 - 8.3 g/dL 6.9 6.9 6.7  Albumin 3.5 - 5.2 g/dL 4.2 4.3 4.5  AST 0 - 37 U/L 27 32 35  ALT 0 - 53 U/L _0 Alk Phosphatase 39 - 117 U/L 86 70 61  Total Bilirubin 0.2 - 1.2 mg/dL 0.6 0.6 0.7  Bilirubin, Direct 0.0 - 0.3 mg/dL - - -    Lab Results  Component Value Date/Time   TSH  1.70 07/10/2021 10:31 AM   TSH 1.73 01/18/2020 02:43 PM    CBC Latest Ref Rng & Units 07/10/2021 01/18/2020 07/06/2019  WBC 4.0 - 10.5 K/uL 6.2 7.8 6.6  Hemoglobin 13.0 - 17.0 g/dL 13.6 14.3 14.6  Hematocrit 39.0 - 52.0 % 40.1 41.9 42.4  Platelets 150.0 - 400.0 K/uL 164.0 194.0 170.0    No results found for: VD25OH  Clinical ASCVD: No  The 10-year ASCVD risk score Mikey Bussing DC Jr., et al., 2013) is: 29.7%   Values used to calculate the score:     Age: 77 years     Sex: Male     Is Non-Hispanic African  American: No     Diabetic: No     Tobacco smoker: No     Systolic Blood Pressure: 569 mmHg     Is BP treated: Yes     HDL Cholesterol: 64.1 mg/dL     Total Cholesterol: 138 mg/dL    Depression screen [facility] 2/9 07/10/2021 02/22/2021 07/06/2019  Decreased Interest 0 0 0  Down, Depressed, Hopeless 0 0 0  PHQ - 2 Score 0 0 0     Social History   Tobacco Use  Smoking Status Former   Types: Cigarettes, Pipe   Quit date: 11/17/1968   Years since quitting: 52.6  Smokeless Tobacco Never   BP Readings from Last 3 Encounters:  07/10/21 (!) 142/81  04/22/21 126/76  10/23/20 134/80   Pulse Readings from Last 3 Encounters:  07/10/21 (!) 57  04/22/21 65  10/23/20 69   Wt Readings from Last 3 Encounters:  07/10/21 200 lb (90.7 kg)  04/22/21 205 lb 6.4 oz (93.2 kg)  10/23/20 206 lb 6.4 oz (93.6 kg)   BMI Readings from Last 3 Encounters:  07/10/21 28.70 kg/m  04/22/21 29.47 kg/m  10/23/20 29.62 kg/m    Assessment/Interventions: Review of patient past medical history, allergies, medications, health status, including review of consultants reports, laboratory and other test data, was performed as part of comprehensive evaluation and provision of chronic care management services.   SDOH:  (Social Determinants of Health) assessments and interventions performed: No  Financial Resource Strain: Low Risk    Difficulty of Paying Living Expenses: Not hard at all    SDOH Screenings   Alcohol Screen: Not on file  Depression (PHQ2-9): Low Risk    PHQ-2 Score: 0  Financial Resource Strain: Low Risk    Difficulty of Paying Living Expenses: Not hard at all  Food Insecurity: No Food Insecurity   Worried About Charity fundraiser in the Last Year: Never true   Ran Out of Food in the Last Year: Never true  Housing: Low Risk    Last Housing Risk Score: 0  Physical Activity: Sufficiently Active   Days of Exercise per Week: 3 days   Minutes of Exercise per Session: 120 min  Social Connections:  Moderately Integrated   Frequency of Communication with Friends and Family: More than three times a week   Frequency of Social Gatherings with Friends and Family: More than three times a week   Attends Religious Services: More than 4 times per year   Active Member of [employer] or Organizations: No   Attends Archivist Meetings: Never   Marital Status: Married  Stress: No Stress Concern Present   Feeling of Stress : Not at all  Tobacco Use: Medium Risk   Smoking Tobacco Use: Former   Smokeless Tobacco Use: Never  Transportation Needs: No Transportation Needs  Lack of Transportation (Medical): No   Lack of Transportation (Non-Medical): No    CCM Care Plan  Allergies  Allergen Reactions   Penicillins Hives    Medications Reviewed Today     Reviewed by Edythe Clarity, [facility] (Pharmacist) on 07/16/21 at 1446  Med List Status: <None>   Medication Order Taking? Sig Documenting Provider Last Dose Status Informant  atorvastatin (LIPITOR) 40 MG tablet [id number] Yes TAKE 1 TABLET BY MOUTH EVERY DAY Donato Heinz, MD Taking Active   finasteride (PROSCAR) 5 MG tablet [id number] Yes TAKE 1 TABLET BY MOUTH EVERY DAY Marletta Lor, MD Taking Active   gabapentin (NEURONTIN) 300 MG capsule [id number] Yes Take 300 mg by mouth 2 (two) times daily. [provider] Taking Active   Glucosamine-Chondroit-Vit C-Mn (GLUCOSAMINE 1500 COMPLEX PO) [id number] Yes Take by mouth. [provider] Taking Active   hydrocortisone 2.5 % cream [id number] Yes  [provider] Taking Active   levothyroxine (SYNTHROID) 137 MCG tablet [id number] Yes TAKE 1 TABLET BY MOUTH EVERY DAY Vivi Barrack, MD Taking Active   losartan (COZAAR) 100 MG tablet [id number] Yes Take 1 tablet (100 mg total) by mouth daily. Donato Heinz, MD Taking Active   meloxicam [facility]) 15 MG tablet [id number] Yes TAKE 1 TABLET (15 MG TOTAL) BY MOUTH DAILY. Gardiner Barefoot, DPM Taking Active    NONFORMULARY OR COMPOUNDED ITEM [id number] Yes Antifungal gel  :Urea gel 40%   Order faxed to [facility]  Patient taking differently: Antifungal gel  :Urea gel 40%   Order faxed to [facility], Tressy Kunzman Mate, DPM Taking Active   sildenafil (REVATIO) 20 MG tablet [id number] Yes TAKE 1 TABLET (20 MG TOTAL) BY MOUTH 3 (THREE) TIMES DAILY AS NEEDED. Vivi Barrack, MD Taking Active   triamcinolone cream (KENALOG) 0.1 % [id number] Yes Apply topically. 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 12/17/2018   Erectile dysfunction 12/17/2018   BPH s/p TURP 2020 03/02/2014   Osteoarthritis 07/16/2012   Hypothyroidism s/p thyroidectomy 1990 due to thyroid cancer 12/26/2008   Dyslipidemia 12/26/2008    Immunization History  Administered Date(s) Administered   Hep A / Hep B 05/24/2012   Hepatitis B 06/24/2012, 12/27/2012   Influenza,inj,Quad PF,6+ Mos 11/24/2013   PFIZER(Purple Top)SARS-COV-2 Vaccination 12/25/2019, 01/19/2020, 09/30/2020   Pneumococcal Conjugate-13 03/09/2014   Pneumococcal Polysaccharide-23 02/17/2012   Tetanus 09/20/2014    Conditions to be addressed/monitored:  HTN, hypothyroidism, Osteoarthritis, Dyslipidemia  Care Plan : General Pharmacy (Adult)  Updates made by Edythe Clarity, RPH since 07/16/2021 12:00 AM     Problem: HTN, HLD, Hypothyroidism   Priority: High  Onset Date: 07/16/2021     Long-Range Goal: Patient-Specific Goal   Start Date: 07/16/2021  Expected End Date: 01/14/2022  This Visit's Progress: On track  Priority: High  Note:   Current Barriers:  None at this time  Pharmacist Clinical Goal(s):  Patient will maintain control of blood pressure as evidenced by home monitoring  through collaboration with PharmD and provider.   Interventions: 1:1  collaboration with Vivi Barrack, MD regarding development and update of comprehensive plan of care as evidenced by provider attestation and co-signature Inter-disciplinary care team collaboration (see longitudinal plan of care) Comprehensive medication review performed; medication list updated in electronic medical  record  Hypertension (BP goal <140/90) -Controlled -Current treatment: Losartan 124m daily -Medications previously tried: metoprolol tartrate  -Current home readings: "normal" -Current dietary habits: wife cooks mostly, he tries to avoid salt. -Current exercise habits: stays active, [facility] -Denies hypotensive/hypertensive symptoms -Educated on BP goals and benefits of medications for prevention of heart attack, stroke and kidney damage; Daily salt intake goal < 2300 mg; Importance of home blood pressure monitoring; -Counseled to monitor BP at home weekly, document, and provide log at future appointments -Recommended to continue current medication  Hyperlipidemia: (LDL goal < 100) -Controlled -Current treatment: Atorvastatin 445mdaily -Medications previously tried: none noted  -Current dietary patterns: see HTN -Current exercise habits: see HTN -Educated on Cholesterol goals;  Benefits of statin for ASCVD risk reduction; Importance of limiting foods high in cholesterol; -Reviewed most recent lipid panel with patient.  His results are excellent! -Recommended to continue current medication  Hypothyroidism (Goal: Maintain TSH) -Controlled -Current treatment  Levothyroxine 13760mdaily -Medications previously tried: none noted -Takes medication appropriately in the morning. -Most recent TSH is WNL.  -Recommended to continue current medication  Patient Goals/Self-Care Activities Patient will:  - take medications as prescribed check blood pressure periodically, document, and provide at future appointments target a minimum of  150 minutes of moderate intensity exercise weekly  Follow Up Plan: The care management team will reach out to the patient again over the next 180 days.         Medication Assistance: None required.  Patient affirms current coverage meets needs.  Compliance/Adherence/Medication fill history: Care Gaps: None at this time  Star-Rating Drugs: Losartan 04/17/21 90ds Atorvastatin 03/20/21 90ds  Patient[facility] pharmacy [facility]  [id number] [id number]: [phone]: [phone] -[id number]: [phone]: [address] [id number]: [phone]: [phone] discussed: Benefits of medication synchronization, packaging and delivery as well as enhanced pharmacist oversight with [employer]. Patient decided to: Continue current medication management strategy  Care Plan and Follow Up Patient Decision:  Patient agrees to Care Plan and Follow-up.  Plan: The care management team will reach out to the patient again over the next 180 days.  ChriBeverly MilcharmD Clinical Pharmacist ([phone]

## 2021-07-16 NOTE — Patient Instructions (Addendum)
Visit Information   Goals Addressed             This Visit's Progress    Track and Manage My Blood Pressure-Hypertension       Timeframe:  Long-Range Goal Priority:  High Start Date: 07/16/21                            Expected End Date:    02/30/23                   Follow Up Date 10/16/21    - check blood pressure weekly - choose a place to take my blood pressure (home, clinic or office, retail store) - write blood pressure results in a log or diary    Why is this important?   You won't feel high blood pressure, but it can still hurt your blood vessels.  High blood pressure can cause heart or kidney problems. It can also cause a stroke.  Making lifestyle changes like losing a little weight or eating less salt will help.  Checking your blood pressure at home and at different times of the day can help to control blood pressure.  If the doctor prescribes medicine remember to take it the way the doctor ordered.  Call the office if you cannot afford the medicine or if there are questions about it.     Notes:        Patient Care Plan: General Pharmacy (Adult)     Problem Identified: HTN, HLD, Hypothyroidism   Priority: High  Onset Date: 07/16/2021     Long-Range Goal: Patient-Specific Goal   Start Date: 07/16/2021  Expected End Date: 01/14/2022  This Visit's Progress: On track  Priority: High  Note:   Current Barriers:  None at this time  Pharmacist Clinical Goal(s):  Patient will maintain control of blood pressure as evidenced by home monitoring  through collaboration with PharmD and provider.   Interventions: 1:1 collaboration with Vivi Barrack, MD regarding development and update of comprehensive plan of care as evidenced by provider attestation and co-signature Inter-disciplinary care team collaboration (see longitudinal plan of care) Comprehensive medication review performed; medication list updated in electronic medical record  Hypertension (BP goal  <140/90) -Controlled -Current treatment: Losartan '100mg'$  daily -Medications previously tried: metoprolol tartrate  -Current home readings: "normal" -Current dietary habits: wife cooks mostly, he tries to avoid salt. -Current exercise habits: stays active, usually will go to [facility] 2-3 times per week -Denies hypotensive/hypertensive symptoms -Educated on BP goals and benefits of medications for prevention of heart attack, stroke and kidney damage; Daily salt intake goal < 2300 mg; Importance of home blood pressure monitoring; -Counseled to monitor BP at home weekly, document, and provide log at future appointments -Recommended to continue current medication  Hyperlipidemia: (LDL goal < 100) -Controlled -Current treatment: Atorvastatin '40mg'$  daily -Medications previously tried: none noted  -Current dietary patterns: see HTN -Current exercise habits: see HTN -Educated on Cholesterol goals;  Benefits of statin for ASCVD risk reduction; Importance of limiting foods high in cholesterol; -Reviewed most recent lipid panel with patient.  His results are excellent! -Recommended to continue current medication  Hypothyroidism (Goal: Maintain TSH) -Controlled -Current treatment  Levothyroxine 151mg daily -Medications previously tried: none noted -Takes medication appropriately in the morning. -Most recent TSH is WNL.  -Recommended to continue current medication  Patient Goals/Self-Care Activities Patient will:  - take medications as prescribed check blood pressure periodically, document, and provide  at future appointments target a minimum of 150 minutes of moderate intensity exercise weekly  Follow Up Plan: The care management team will reach out to the patient again over the next 180 days.        Mr. Buchman was given information about Chronic Care Management services today including:  CCM service includes personalized support from designated clinical staff supervised by his  physician, including individualized plan of care and coordination with other care providers 24/7 contact phone numbers for assistance for urgent and routine care needs. Standard insurance, coinsurance, copays and deductibles apply for chronic care management only during months in which we provide at least 20 minutes of these services. Most insurances cover these services at 100%, however patients may be responsible for any copay, coinsurance and/or deductible if applicable. This service may help you avoid the need for more expensive face-to-face services. Only one practitioner may furnish and bill the service in a calendar month. The patient may stop CCM services at any time (effective at the end of the month) by phone call to the office staff.  Patient agreed to services and verbal consent obtained.   Patient verbalizes understanding of instructions provided today and agrees to view in [place].  Telephone follow up appointment with pharmacy team member scheduled for:  Edythe Clarity, [place]

## 2021-07-29 ENCOUNTER — Telehealth: Payer: Self-pay | Admitting: Pharmacist

## 2021-07-29 NOTE — Progress Notes (Signed)
Cardiology Office Note:    Date:  08/01/2021   ID:  Carlos David, DOB 02/06/1946, MRN [id number]  PCP:  Vivi Barrack, MD  Cardiologist:  None  Electrophysiologist:  None   Referring MD: Vivi Barrack, MD   No chief complaint on file.   History of Present Illness:    Carlos David is a 75 y.o. male with a hx of thyroid cancer now hypothyroidism, hyperlipidemia who presents for follow-up.  He was referred by [name], PA-C for evaluation of chest pain, initially seen on 01/19/2020.  He reported that he has been having chest pain for the preceding few months.  Occured daily.  Chest pain is located under his left breast.  Describes as pinching pain.  Can last for few minutes.  Typically occurs at rest.  Reports that he exercises at the [facility], will do the elliptical for 30 minutes.  Has not noted exertional chest pain.  Does report some lightheadedness with standing.  Denies any syncope, palpitations, lower extremity edema.  Smoked cigarettes as teenager for few years.  Smoked pipe for 10 years.  Mother had MI in 26s.    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  Since last clinic visit, he reports that he has been having issues with shortness of breath.  Reports he particularly notices it when walking up his driveway.  He denies any chest pain.  Reports occasional lightheadedness but denies any syncope.  Denies any lower extremity edema or palpitations.   Past Medical History:  Diagnosis Date   Actinic keratosis 12/16/2010   Qualifier: Diagnosis of  By: [name]  MD, [name]    BPH (benign prostatic hyperplasia)    Cancer [facility])    thyroid cancer 1990   Cataract    both  eyes   History of radiation therapy 1990   HYPERLIPIDEMIA 12/26/2008   HYPOTHYROIDISM 12/26/2008   PPD positive    THYROID CANCER, HX OF 1990   THYROID CANCER, HX OF 12/26/2008   Qualifier: Diagnosis of  By: [name]  MD, [name]     Past Surgical History:  Procedure Laterality Date   CATARACT EXTRACTION, BILATERAL  2020   COLONOSCOPY     10-ye ago-normal at [facility]   [place]     as child   HAND SURGERY     right hand trigger figger    neckotomy  1990   20 lymph nodes removed from left neck   TOTAL THYROIDECTOMY  1990   TRANSURETHRAL RESECTION OF PROSTATE N/A 01/21/2019   Procedure: TRANSURETHRAL RESECTION OF THE PROSTATE (TURP);  Surgeon: [name], MD;  Location: [facility];  Service: Urology;  Laterality: N/A;  75 MINS    Current Medications: Current Meds  Medication Sig   aspirin EC 81 MG tablet Take 1 tablet (81 mg total) by mouth daily. Swallow whole.   atorvastatin (LIPITOR) 40 MG tablet TAKE 1 TABLET BY MOUTH EVERY DAY   finasteride (PROSCAR) 5 MG tablet TAKE 1 TABLET BY MOUTH EVERY DAY   gabapentin (NEURONTIN) 300 MG capsule Take 300 mg by mouth 2 (two) times daily.   Glucosamine-Chondroit-Vit C-Mn (GLUCOSAMINE 1500 COMPLEX PO) Take by mouth.   hydrocortisone 2.5 % cream  levothyroxine (SYNTHROID) 137 MCG tablet TAKE 1 TABLET BY MOUTH EVERY DAY   losartan (COZAAR) 100 MG tablet Take 1 tablet (100 mg total) by mouth daily.   sildenafil (REVATIO) 20 MG tablet TAKE 1 TABLET (20 MG TOTAL) BY MOUTH 3 (THREE) TIMES DAILY AS NEEDED.   [DISCONTINUED] meloxicam (MOBIC) 15 MG tablet TAKE 1 TABLET (15 MG TOTAL) BY MOUTH DAILY.     Allergies:   Penicillins   Social History   Socioeconomic History   Marital status: Married    Spouse name: Not on file   Number of children: Not on file   Years of education: Not on file   Highest education level: Not on file  Occupational History   Not on file  Tobacco Use   Smoking status: Former    Types:  Cigarettes, Pipe    Quit date: 11/17/1968    Years since quitting: 52.7   Smokeless tobacco: Never  Vaping Use   Vaping Use: Never used  Substance and Sexual Activity   Alcohol use: Yes    Alcohol/week: 0.0 standard drinks    Comment: occas. beer   Drug use: No   Sexual activity: Not on file  Other Topics Concern   Not on file  Social History Narrative   Not on file   Social Determinants of Health   Financial Resource Strain: Low Risk    Difficulty of Paying Living Expenses: Not hard at all  Food Insecurity: No Food Insecurity   Worried About Running Out of Food in the Last Year: Never true   [place] in the Last Year: Never true  Transportation Needs: No Transportation Needs   Lack of Transportation (Medical): No   Lack of Transportation (Non-Medical): No  Physical Activity: Sufficiently Active   Days of Exercise per Week: 3 days   Minutes of Exercise per Session: 120 min  Stress: No Stress Concern Present   Feeling of Stress : Not at all  Social Connections: Moderately Integrated   Frequency of Communication with Friends and Family: More than three times a week   Frequency of Social Gatherings with Friends and Family: More than three times a week   Attends Religious Services: More than 4 times per year   Active Member of [employer] or Organizations: No   Attends Archivist Meetings: Never   Marital Status: Married     Family History: The patient's family history is negative for Colon cancer.  ROS:   Please see the history of present illness.     All other systems reviewed and are negative.  EKGs/Labs/Other Studies Reviewed:    The following studies were reviewed today:   EKG:   07/30/2021: Normal sinus rhythm, rate 74, no ST abnormalities 04/22/2021: NSR, rate 65, No ST abnormalities 01/19/2020: normal sinus rhythm, rate 69, left axis deviation, no ST/T abnormalities  Lexiscan Myoview 11/28/16: Nuclear stress EF: 54%. The study is normal. This is a  low risk study. The left ventricular ejection fraction is mildly decreased (45-54%). There was no ST segment deviation noted during stress.   Normal resting and stress perfusion. No ischemia or infarction EF 54%   Coronary CTA 03/15/20: IMPRESSION: 1. Coronary calcium score of 526. This was 65 percentile for age and sex matched control.   2. Normal coronary origin with right dominance.   3. CAD-RADS 3. Mild stenosis in the proximal and moderate stenosis in the mid LAD. Consider symptom-guided anti-ischemic pharmacotherapy as well as risk factor modification per guideline directed  care. Additional analysis with CT FFR will be submitted.   4. 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 No dissection.   5. Mildly dilated pulmonary artery measuring 33 mm.  1. Left Main: 0.99.   2. LAD: Proximal: 0.96, mid: 0.86, distal: 0.83. 3. LCX: 0.94 4. OM1: 0.96. 5. OM2: 9.97. 6. RCA: Proximal: 0.98, distal: 0.96.   IMPRESSION: 1. CT FFR analysis didn't show any significant stenosis. Aggressive medical management is recommended.    IMPRESSION: 1.  No acute findings in the imaged extracardiac chest. 2.  Aortic Atherosclerosis (ICD10-I70.0).   TTE 02/06/20:  1. Left ventricular ejection fraction, by estimation, is 60 to 65%. The  left ventricle has normal function. The left ventricle has no regional  wall motion abnormalities. There is mild asymmetric left ventricular  hypertrophy. Left ventricular diastolic  parameters are indeterminate.   2. Right ventricular systolic function is normal. The right ventricular  size is normal.   3. The mitral valve is normal in structure. Mild mitral valve  regurgitation. No evidence of mitral stenosis.   4. The aortic valve is normal in structure. Aortic valve regurgitation is  not visualized. No aortic stenosis is present.   5. Aortic dilatation noted. There is mild to moderate dilatation of the  ascending aorta measuring 44 mm.    CT FFR 03/15/2020: FINDINGS: FFRct analysis was performed on the original cardiac CT angiogram dataset. Diagrammatic representation of the FFRct analysis is provided in a separate PDF document in PACS. This dictation was created using the PDF document and an interactive 3D model of the results. 3D model is not available in the EMR/PACS. Normal FFR range is >0.80.   1. Left Main: 0.99. 2. LAD: Proximal: 0.96, mid: 0.86, distal: 0.83. 3. LCX: 0.94 4. OM1: 0.96. 5. OM2: 9.97. 6. RCA: Proximal: 0.98, distal: 0.96.   IMPRESSION: 1. CT FFR analysis didn't show any significant stenosis. Aggressive medical management is recommended.  CT Angiography Chest 03/13/2021: IMPRESSION: 1. 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 Recommend annual imaging followup by CTA or MRA. This recommendation follows 2010 ACCF/AHA/AATS/ACR/ASA/SCA/SCAI/SIR/STS/SVM Guidelines for the Diagnosis and Management of Patients with Thoracic Aortic Disease. Circulation. 2010; [id number]. Aortic aneurysm NOS (ICD10-I71.9) 2. No acute chest abnormality. 3. Small pleural-based nodular density in the right upper lobe measuring roughly 5 mm. This is indeterminate. No follow-up needed if patient is low-risk. Non-contrast chest CT can be considered in 12 months if patient is high-risk. This recommendation follows the consensus statement: Guidelines for Management of Incidental Pulmonary Nodules Detected on CT Images: From the Fleischner Society 2017; Radiology 2017; 284:228-243. 4. Evidence for small tracheal diverticula. Recommend attention on follow-up imaging.  Recent Labs: 07/10/2021: ALT 23; BUN 27; Creatinine, Ser 1.15; Hemoglobin 13.6; Platelets 164.0; Potassium 4.4; Sodium 137; TSH 1.70  Recent Lipid Panel    Component Value Date/Time   CHOL 138 07/10/2021 1031   CHOL 137 07/05/2020 0823   TRIG 62.0 07/10/2021 1031   HDL 64.10 07/10/2021 1031   HDL 54  07/05/2020 0823   CHOLHDL 2 07/10/2021 1031   VLDL 12.4 07/10/2021 1031   LDLCALC 61 07/10/2021 1031   LDLCALC 68 07/05/2020 0823    Physical Exam:    VS:  BP 116/72 (BP Location: Right Arm, Patient Position: Sitting, Cuff Size: Large)   Pulse 74   Ht '5\' 10"'$  (1.778 m)   Wt 201 lb 12.8 oz (91.5 kg)   SpO2 98%   BMI 28.96  kg/m     Wt Readings from Last 3 Encounters:  07/30/21 201 lb 12.8 oz (91.5 kg)  07/10/21 200 lb (90.7 kg)  04/22/21 205 lb 6.4 oz (93.2 kg)     GEN:  Well nourished, well developed in no acute distress HEENT: Normal NECK: No JVD; No carotid bruits LYMPHATICS: No lymphadenopathy CARDIAC: RRR, no murmurs, rubs, gallops RESPIRATORY:  Clear to auscultation without rales, wheezing or rhonchi  ABDOMEN: Soft, non-tender, non-distended MUSCULOSKELETAL:  No edema; No deformity  SKIN: Warm and dry NEUROLOGIC:  Alert and oriented x 3 PSYCHIATRIC:  Normal affect   ASSESSMENT:    1. CAD in native artery   2. Shortness of breath   3. Essential hypertension   4. Hyperlipidemia, unspecified hyperlipidemia type   5. Ectatic aorta (HCC)   6. Pulmonary nodule     PLAN:     CAD: Presented with atypical chest pain.  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  He reports chest pain has resolved but now having dyspnea on exertion, which could represent anginal equivalent -Continue atorvastatin 40 mg daily. -Start aspirin 81 mg daily -Given worsening dyspnea on exertion, recommend exercise Myoview to evaluate for ischemia  Hypertension: On losartan 100 mg daily.  Appears controlled  Hyperlipidemia: Continue atorvastatin 40 mg daily.  LDL 61 on 07/10/21  Aortic dilatation: ascending aorta measures 75m on CTA.  Repeat CTA on 02/2021 showed stable ascending aortic dilatation measuring 40  mm.  Pulmonary nodule: 5 mm right upper lobe nodule noted on CTA chest 03/13/2021.  Follow-up CT chest in 1 year   RTC in 6 months   Shared Decision Making/Informed Consent The risks [chest pain, shortness of breath, cardiac arrhythmias, dizziness, blood pressure fluctuations, myocardial infarction, stroke/transient ischemic attack, nausea, vomiting, allergic reaction, radiation exposure, metallic taste sensation and life-threatening complications (estimated to be 1 in 10,000)], benefits (risk stratification, diagnosing coronary artery disease, treatment guidance) and alternatives of a nuclear stress test were discussed in detail with Mr. KSharumand he agrees to proceed.   Medication Adjustments/Labs and Tests Ordered: Current medicines are reviewed at length with the patient today.  Concerns regarding medicines are outlined above.  Orders Placed This Encounter  Procedures   MYOCARDIAL PERFUSION IMAGING   EKG 12-Lead    Meds ordered this encounter  Medications   aspirin EC 81 MG tablet    Sig: Take 1 tablet (81 mg total) by mouth daily. Swallow whole.    Dispense:  90 tablet    Refill:  3   meloxicam (MOBIC) 15 MG tablet    Sig: Take 1 tablet (15 mg total) by mouth as needed.    Dispense:  30 tablet    Refill:  0     Patient Instructions  Medication Instructions:  START aspirin 81 mg daily Change meloxicam to AS NEEDED  *If you need a refill on your cardiac medications before your next appointment, please call your pharmacy*  Testing/Procedures: Your physician has requested that you have an exercise stress myoview (at [facility]. For further information please visit [email or web address] Please follow instruction sheet, as given.  Follow-Up: [facility] and your [facility] needs are our priority.  As part of our continuing mission to provide you with exceptional heart care, we have created designated Provider Care Teams.  These Care Teams include your  primary  Cardiologist (physician) and Advanced Practice Providers (APPs -  Physician Assistants and Nurse Practitioners) who all work together to provide you with the care you need, when you need it.  We recommend signing up for the patient portal called "MyChart".  Sign up information is provided on this After Visit Summary.  MyChart is used to connect with patients for Virtual Visits (Telemedicine).  Patients are able to view lab/test results, encounter notes, upcoming appointments, etc.  Non-urgent messages can be sent to your provider as well.   To learn more about what you can do with MyChart, go to [email or web address].    Your next appointment:   6 month(s)  The format for your next appointment:   In Person  Provider:   Oswaldo Milian, MD       Signed, Donato Heinz, MD  08/01/2021 6:15 AM    [place]

## 2021-07-29 NOTE — Progress Notes (Signed)
    Chronic Care Management Pharmacy Assistant   Name: Carlos David  MRN: [id number] DOB: [birth year]  Reason for Encounter: CCM Care Plan  Medications: Outpatient Encounter Medications as of 07/29/2021  Medication Sig   atorvastatin (LIPITOR) 40 MG tablet TAKE 1 TABLET BY MOUTH EVERY DAY   finasteride (PROSCAR) 5 MG tablet TAKE 1 TABLET BY MOUTH EVERY DAY   gabapentin (NEURONTIN) 300 MG capsule Take 300 mg by mouth 2 (two) times daily.   Glucosamine-Chondroit-Vit C-Mn (GLUCOSAMINE 1500 COMPLEX PO) Take by mouth.   hydrocortisone 2.5 % cream    levothyroxine (SYNTHROID) 137 MCG tablet TAKE 1 TABLET BY MOUTH EVERY DAY   losartan (COZAAR) 100 MG tablet Take 1 tablet (100 mg total) by mouth daily.   meloxicam (MOBIC) 15 MG tablet TAKE 1 TABLET (15 MG TOTAL) BY MOUTH DAILY.   NONFORMULARY OR COMPOUNDED ITEM Antifungal gel  :Urea gel 40%   Order faxed to [place] (Patient taking differently: Antifungal gel  :Urea gel 40%   Order faxed to [employer])   sildenafil (REVATIO) 20 MG tablet TAKE 1 TABLET (20 MG TOTAL) BY MOUTH 3 (THREE) TIMES DAILY AS NEEDED.   triamcinolone cream (KENALOG) 0.1 % Apply topically.   No facility-administered encounter medications on file as of 07/29/2021.   Reviewed the patients initial visit reinsured it was completed per the pharmacist Leata Mouse request. Printed the CCM care plan. Mailed the patient CCM care plan to their most recent address on file.  Follow-Up:Pharmacist Review  Charlann Lange, [place] Pharmacist Assistant [phone]

## 2021-07-29 NOTE — Telephone Encounter (Signed)
Can we add-on for appointment tomorrow?

## 2021-07-30 ENCOUNTER — Ambulatory Visit: Payer: Medicare HMO | Admitting: Cardiology

## 2021-07-30 ENCOUNTER — Other Ambulatory Visit: Payer: Self-pay

## 2021-07-30 ENCOUNTER — Encounter: Payer: Self-pay | Admitting: Cardiology

## 2021-07-30 VITALS — BP 116/72 | HR 74 | Ht 70.0 in | Wt 201.8 lb

## 2021-07-30 DIAGNOSIS — I77819 Aortic ectasia, unspecified site: Secondary | ICD-10-CM | POA: Diagnosis not present

## 2021-07-30 DIAGNOSIS — R911 Solitary pulmonary nodule: Secondary | ICD-10-CM

## 2021-07-30 DIAGNOSIS — I10 Essential (primary) hypertension: Secondary | ICD-10-CM

## 2021-07-30 DIAGNOSIS — E785 Hyperlipidemia, unspecified: Secondary | ICD-10-CM | POA: Diagnosis not present

## 2021-07-30 DIAGNOSIS — R0602 Shortness of breath: Secondary | ICD-10-CM | POA: Diagnosis not present

## 2021-07-30 DIAGNOSIS — I2510 Atherosclerotic heart disease of native coronary artery without angina pectoris: Secondary | ICD-10-CM | POA: Diagnosis not present

## 2021-07-30 MED ORDER — ASPIRIN EC 81 MG PO TBEC: 81.0000 mg | DELAYED_RELEASE_TABLET | Freq: Every day | ORAL | 3 refills | Status: AC

## 2021-07-30 MED ORDER — MELOXICAM 15 MG PO TABS: 15.0000 mg | ORAL_TABLET | ORAL | 0 refills | Status: AC | PRN

## 2021-07-30 NOTE — Patient Instructions (Signed)
Medication Instructions:  START aspirin 81 mg daily Change meloxicam to AS NEEDED  *If you need a refill on your cardiac medications before your next appointment, please call your pharmacy*  Testing/Procedures: Your physician has requested that you have an exercise stress myoview (at [facility]). For further information please visit [email or web address]. Please follow instruction sheet, as given.  Follow-Up: At [facility], you and your health needs are our priority.  As part of our continuing mission to provide you with exceptional heart care, we have created designated Provider Care Teams.  These Care Teams include your primary Cardiologist (physician) and Advanced Practice Providers (APPs -  Physician Assistants and Nurse Practitioners) who all work together to provide you with the care you need, when you need it.  We recommend signing up for the patient portal called "MyChart".  Sign up information is provided on this After Visit Summary.  MyChart is used to connect with patients for Virtual Visits (Telemedicine).  Patients are able to view lab/test results, encounter notes, upcoming appointments, etc.  Non-urgent messages can be sent to your provider as well.   To learn more about what you can do with MyChart, go to [email or web address].    Your next appointment:   6 month(s)  The format for your next appointment:   In Person  Provider:   Oswaldo Milian, MD

## 2021-08-01 ENCOUNTER — Telehealth (HOSPITAL_COMMUNITY): Payer: Self-pay | Admitting: *Deleted

## 2021-08-01 NOTE — Telephone Encounter (Signed)
Patient given detailed instructions per Myocardial Perfusion Study Information Sheet for the test on 08/07/21 Patient notified to arrive 15 minutes early and that it is imperative to arrive on time for appointment to keep from having the test rescheduled.  If you need to cancel or reschedule your appointment, please call the office within 24 hours of your appointment. . Patient verbalized understanding. Kirstie Peri

## 2021-08-07 ENCOUNTER — Ambulatory Visit (HOSPITAL_COMMUNITY): Payer: Medicare HMO | Attending: Cardiovascular Disease

## 2021-08-07 ENCOUNTER — Other Ambulatory Visit: Payer: Self-pay

## 2021-08-07 DIAGNOSIS — I2510 Atherosclerotic heart disease of native coronary artery without angina pectoris: Secondary | ICD-10-CM | POA: Insufficient documentation

## 2021-08-07 DIAGNOSIS — R0602 Shortness of breath: Secondary | ICD-10-CM | POA: Diagnosis not present

## 2021-08-07 LAB — MYOCARDIAL PERFUSION IMAGING
Angina Index: 0
Duke Treadmill Score: 5
Estimated workload: 6.7
Exercise duration (min): 4 min
Exercise duration (sec): 45 s
LV dias vol: 80 mL (ref 62–150)
LV sys vol: 39 mL
MPHR: 144 {beats}/min
Nuc Stress EF: 51 %
Peak HR: 131 {beats}/min
Percent HR: 91 %
RPE: 19
Rest HR: 63 {beats}/min
Rest Nuclear Isotope Dose: 9.7 mCi
SDS: 1
SRS: 1
SSS: 2
ST Depression (mm): 0 mm
Stress Nuclear Isotope Dose: 31.4 mCi
TID: 0.88

## 2021-08-07 MED ORDER — TECHNETIUM TC 99M TETROFOSMIN IV KIT
9.7000 | PACK | Freq: Once | INTRAVENOUS | Status: AC | PRN
  Administered 2021-08-07: 9.7 via INTRAVENOUS
  Filled 2021-08-07: qty 10

## 2021-08-07 MED ORDER — TECHNETIUM TC 99M TETROFOSMIN IV KIT
31.4000 | PACK | Freq: Once | INTRAVENOUS | Status: AC | PRN
  Administered 2021-08-07: 31.4 via INTRAVENOUS
  Filled 2021-08-07: qty 32

## 2021-08-12 ENCOUNTER — Telehealth: Payer: Self-pay | Admitting: *Deleted

## 2021-09-02 ENCOUNTER — Ambulatory Visit: Payer: Medicare HMO | Admitting: Podiatry

## 2021-09-02 ENCOUNTER — Other Ambulatory Visit: Payer: Self-pay

## 2021-09-02 ENCOUNTER — Encounter: Payer: Self-pay | Admitting: Podiatry

## 2021-09-02 DIAGNOSIS — L600 Ingrowing nail: Secondary | ICD-10-CM | POA: Diagnosis not present

## 2021-09-02 NOTE — Progress Notes (Signed)
Subjective:  Patient ID: Carlos David, male    DOB: 01/16/1946,   MRN: [id number]  Chief Complaint  Patient presents with   Toe Pain    Hallux right - medial border, tender x couple weeks, tried trimming it out many times before, but no help this time.   Nail Problem    Hallux left - thick, discolored nail x years, tried using wife's Formula 7 bought from our office    75 y.o. male presents for ingrown nail of right great toe. States he has dealt with this for many years. Has had the nail removed before. Has tried to trim it out without success. Relates his left hallux nail is thickened and discolored and he has damaged it in the past.  . Denies any other pedal complaints. Denies n/v/f/c.   Past Medical History:  Diagnosis Date   Actinic keratosis 12/16/2010   Qualifier: Diagnosis of  By: [name]  MD, [name]    BPH (benign prostatic hyperplasia)    Cancer [facility])    thyroid cancer 1990   Cataract    both eyes   History of radiation therapy 1990   HYPERLIPIDEMIA 12/26/2008   HYPOTHYROIDISM 12/26/2008   PPD positive    THYROID CANCER, HX OF 1990   THYROID CANCER, HX OF 12/26/2008   Qualifier: Diagnosis of  By: [name]  MD, [name]     Objective:  Physical Exam: Vascular: DP/PT pulses 2/4 bilateral. CFT <3 seconds. Normal hair growth on digits. No edema.  Skin. No lacerations or abrasions bilateral feet. Incurvation noted to medial hallux nail border. No erythema edema or purulence noted.  Musculoskeletal: MMT 5/5 bilateral lower extremities in DF, PF, Inversion and Eversion. Deceased ROM in DF of ankle joint.  Neurological: Sensation intact to light touch.   Assessment:   1. Ingrown nail of great toe of right foot      Plan:  Patient was evaluated and treated and all questions answered. Patient requesting removal of ingrown nail today. Procedure below.  Discussed procedure and post procedure care and patient expressed understanding.  Recommended urea nail gel for left  great toenail.  Will follow-up in 2 weeks for nail check or sooner if any problems arise.    Procedure:  Procedure: partial Nail Avulsion of right hallux medial nail border.  Surgeon: Lorenda Peck, DPM  Pre-op Dx: Ingrown toenail without infection Post-op: Same  Place of Surgery: Office exam room.  Indications for surgery: Painful and ingrown toenail.  Findings: There is redness, warmth, and swelling of tissues with incurvation of nail border. Purulence noted.    The patient is requesting removal of nail with chemical matrixectomy. Risks and complications were discussed with the patient for which they understand and written consent was obtained. Under sterile conditions a total of 3 mL of 1:1 mixture 0.5% marcaine plain and 1% lidocaine plain was infiltrated in a hallux block fashion. Once anesthetized, the skin was prepped in sterile fashion. A tourniquet was then applied. Next the medial aspect of hallux nail border was then sharply excised making sure to remove the entire offending nail border.  Next phenol was then applied under standard conditions and copiously irrigated. Silvadene was applied. A dry sterile dressing was applied. After application of the dressing the tourniquet was removed and there is found to be an immediate capillary refill time to the digit. The patient tolerated the procedure well without any complications. Post procedure instructions were discussed the patient for which he verbally understood. Follow-up in  two weeks for nail check or sooner if any problems are to arise. Discussed signs/symptoms of infection and directed to call the office immediately should any occur or go directly to the emergency room. In the meantime, encouraged to call the office with any questions, concerns, changes symptoms.   Lorenda Peck, DPM

## 2021-09-02 NOTE — Patient Instructions (Signed)

## 2021-09-09 DIAGNOSIS — H52203 Unspecified astigmatism, bilateral: Secondary | ICD-10-CM | POA: Diagnosis not present

## 2021-09-09 DIAGNOSIS — H5212 Myopia, left eye: Secondary | ICD-10-CM | POA: Diagnosis not present

## 2021-09-09 DIAGNOSIS — H524 Presbyopia: Secondary | ICD-10-CM | POA: Diagnosis not present

## 2021-09-09 DIAGNOSIS — Z961 Presence of intraocular lens: Secondary | ICD-10-CM | POA: Diagnosis not present

## 2021-09-10 NOTE — Telephone Encounter (Signed)
error 

## 2021-09-13 ENCOUNTER — Other Ambulatory Visit: Payer: Self-pay | Admitting: Cardiology

## 2021-09-18 ENCOUNTER — Ambulatory Visit: Payer: Medicare HMO | Admitting: Podiatry

## 2021-09-23 ENCOUNTER — Encounter: Payer: Self-pay | Admitting: Podiatry

## 2021-09-23 ENCOUNTER — Ambulatory Visit: Payer: Medicare HMO | Admitting: Podiatry

## 2021-09-23 ENCOUNTER — Other Ambulatory Visit: Payer: Self-pay

## 2021-09-23 DIAGNOSIS — L600 Ingrowing nail: Secondary | ICD-10-CM

## 2021-09-23 NOTE — Progress Notes (Signed)
  Subjective:  Patient ID: Carlos David, male    DOB: 1946/04/02,   MRN: [id number]  No chief complaint on file.   75 y.o. male presents for follow-up or right great ingrown toenail procedure. Relates it is doing well. Denies pain. Has been soaking and using neosporin . Denies any other pedal complaints. Denies n/v/f/c.   Past Medical History:  Diagnosis Date   Actinic keratosis 12/16/2010   Qualifier: Diagnosis of  By: [name]  MD, [name]    BPH (benign prostatic hyperplasia)    Cancer [facility])    thyroid cancer 1990   Cataract    both eyes   History of radiation therapy 1990   HYPERLIPIDEMIA 12/26/2008   HYPOTHYROIDISM 12/26/2008   PPD positive    THYROID CANCER, HX OF 1990   THYROID CANCER, HX OF 12/26/2008   Qualifier: Diagnosis of  By: [name]  MD, [name]     Objective:  Physical Exam: Vascular: DP/PT pulses 2/4 bilateral. CFT <3 seconds. Normal hair growth on digits. No edema.  Skin. No lacerations or abrasions bilateral feet. No erythema or edema. No incurvation noted. No pain Musculoskeletal: MMT 5/5 bilateral lower extremities in DF, PF, Inversion and Eversion. Deceased ROM in DF of ankle joint.  Neurological: Sensation intact to light touch.   Assessment:   1. Ingrown nail of great toe of right foot      Plan:  Patient was evaluated and treated and all questions answered. Toe was evaluated and appears to be healing well.  May discontinue soaks and neosporin.  Patient to follow-up as needed.    Lorenda Peck, DPM

## 2021-09-30 DIAGNOSIS — M48062 Spinal stenosis, lumbar region with neurogenic claudication: Secondary | ICD-10-CM | POA: Diagnosis not present

## 2021-10-14 ENCOUNTER — Other Ambulatory Visit: Payer: Self-pay | Admitting: Cardiology

## 2021-10-16 DIAGNOSIS — M48062 Spinal stenosis, lumbar region with neurogenic claudication: Secondary | ICD-10-CM | POA: Diagnosis not present

## 2021-11-06 ENCOUNTER — Ambulatory Visit (INDEPENDENT_AMBULATORY_CARE_PROVIDER_SITE_OTHER): Payer: Medicare HMO | Admitting: Family

## 2021-11-06 ENCOUNTER — Other Ambulatory Visit: Payer: Self-pay

## 2021-11-06 ENCOUNTER — Encounter: Payer: Self-pay | Admitting: Family

## 2021-11-06 VITALS — BP 135/86 | HR 66 | Temp 97.6°F | Ht 70.0 in | Wt 200.0 lb

## 2021-11-06 DIAGNOSIS — R6889 Other general symptoms and signs: Secondary | ICD-10-CM | POA: Diagnosis not present

## 2021-11-06 LAB — POCT INFLUENZA A/B
Influenza A, POC: NEGATIVE
Influenza B, POC: NEGATIVE

## 2021-11-06 LAB — POC COVID19 BINAXNOW: SARS Coronavirus 2 Ag: NEGATIVE

## 2021-11-06 NOTE — Assessment & Plan Note (Signed)
covid and rapid flu both negative. pt taking tylenol cold & flu, advised to try OTC Flonase 1 spray each nostril qd and see if symptoms improve. having family over for holiday and did not want to pass along anything contagious. advised on 2 liters water qd.

## 2021-11-06 NOTE — Progress Notes (Signed)
Subjective:     Patient ID: Carlos David, male    DOB: 1946/05/15, 75 y.o.   MRN: [id number]  Chief Complaint  Patient presents with   Headache   Cough    Symptoms started last week. He has taken OTC Tylenol cold and Flu and Zycam. He says cough comes and goes. He denies sore throat, and nasal congestion.     HPI: Upper Respiratory Infection: Symptoms include congestion, non productive cough, post nasal drip, and headache .  Onset of symptoms was 6 days ago, unchanged since that time. He is drinking moderate amounts of fluids. Evaluation to date: none.  Treatment to date: none.    There are no preventive care reminders to display for this patient.  Past Medical History:  Diagnosis Date   Actinic keratosis 12/16/2010   Qualifier: Diagnosis of  By: [name]  MD, [name]    BPH (benign prostatic hyperplasia)    Cancer [facility])    thyroid cancer 1990   Cataract    both eyes   History of radiation therapy 1990   HYPERLIPIDEMIA 12/26/2008   HYPOTHYROIDISM 12/26/2008   PPD positive    THYROID CANCER, HX OF 1990   THYROID CANCER, HX OF 12/26/2008   Qualifier: Diagnosis of  By: [name]  MD, [name]     Past Surgical History:  Procedure Laterality Date   CATARACT EXTRACTION, BILATERAL  2020   COLONOSCOPY     10-ye ago-normal at [facility]   [place]     as child   HAND SURGERY     right hand trigger figger    neckotomy  1990   20 lymph nodes removed from left neck   TOTAL THYROIDECTOMY  1990   TRANSURETHRAL RESECTION OF PROSTATE N/A 01/21/2019   Procedure: TRANSURETHRAL RESECTION OF THE PROSTATE (TURP);  Surgeon: [name], MD;  Location: [facility];  Service: Urology;  Laterality: N/A;  75 MINS    Outpatient Medications Prior to Visit  Medication Sig Dispense Refill   aspirin EC 81 MG tablet Take 1 tablet (81 mg total) by mouth daily. Swallow whole. 90 tablet 3   atorvastatin (LIPITOR) 40 MG tablet TAKE 1 TABLET BY MOUTH EVERY DAY 90 tablet 1    finasteride (PROSCAR) 5 MG tablet TAKE 1 TABLET BY MOUTH EVERY DAY 90 tablet 1   gabapentin (NEURONTIN) 300 MG capsule Take 1 capsule by mouth 2 (two) times daily.     Glucosamine-Chondroit-Vit C-Mn (GLUCOSAMINE 1500 COMPLEX PO) Take by mouth.     hydrocortisone 2.5 % cream      levothyroxine (SYNTHROID) 137 MCG tablet TAKE 1 TABLET BY MOUTH EVERY DAY 90 tablet 3   losartan (COZAAR) 100 MG tablet TAKE 1 TABLET BY MOUTH EVERY DAY 90 tablet 3   meloxicam (MOBIC) 15 MG tablet Take 1 tablet (15 mg total) by mouth as needed. 30 tablet 0   meloxicam (MOBIC) 15 MG tablet Take 1 tablet by mouth daily.     sildenafil (REVATIO) 20 MG tablet TAKE 1 TABLET (20 MG TOTAL) BY MOUTH 3 (THREE) TIMES DAILY AS NEEDED. 50 tablet 1   NONFORMULARY OR COMPOUNDED ITEM Antifungal gel  :Urea gel 40%   Order faxed to [place] (Patient not taking: Reported on 07/30/2021)     triamcinolone cream (KENALOG) 0.1 % Apply topically. (Patient not taking: Reported on 07/30/2021)     No facility-administered medications prior to visit.    Allergies  Allergen Reactions   Penicillins Hives  Objective:    Physical Exam Vitals and nursing note reviewed.  Constitutional:      General: He is not in acute distress.    Appearance: Normal appearance.  HENT:     Head: Normocephalic.     Right Ear: Tympanic membrane and ear canal normal.     Left Ear: Tympanic membrane and ear canal normal.     Mouth/Throat:     Mouth: Mucous membranes are moist.     Pharynx: Oropharyngeal exudate present. No pharyngeal swelling or posterior oropharyngeal erythema.  Cardiovascular:     Rate and Rhythm: Normal rate and regular rhythm.  Pulmonary:     Effort: Pulmonary effort is normal.     Breath sounds: Normal breath sounds.  Musculoskeletal:        General: Normal range of motion.     Cervical back: Normal range of motion.  Skin:    General: Skin is warm and dry.  Neurological:     Mental Status: He is alert and  oriented to person, place, and time.  Psychiatric:        Mood and Affect: Mood normal.    BP 135/86    Pulse 66    Temp 97.6 F (36.4 C) (Temporal)    Ht 5\' 10"  (1.778 m)    Wt 200 lb (90.7 kg)    SpO2 98%    BMI 28.70 kg/m  Wt Readings from Last 3 Encounters:  11/06/21 200 lb (90.7 kg)  08/07/21 201 lb (91.2 kg)  07/30/21 201 lb 12.8 oz (91.5 kg)       Assessment & Plan:   Problem List Items Addressed This Visit       Other   Flu-like symptoms - Primary    covid and rapid flu both negative. pt taking tylenol cold & flu, advised to try OTC Flonase 1 spray each nostril qd and see if symptoms improve. having family over for holiday and did not want to pass along anything contagious. advised on 2 liters water qd.      Relevant Orders   POCT Influenza A/B (Completed)   POC COVID-19 (Completed)

## 2021-11-08 ENCOUNTER — Encounter: Payer: Self-pay | Admitting: Podiatry

## 2021-11-08 ENCOUNTER — Ambulatory Visit (INDEPENDENT_AMBULATORY_CARE_PROVIDER_SITE_OTHER): Payer: Medicare HMO | Admitting: Podiatry

## 2021-11-08 ENCOUNTER — Other Ambulatory Visit: Payer: Self-pay

## 2021-11-08 DIAGNOSIS — L600 Ingrowing nail: Secondary | ICD-10-CM

## 2021-11-08 NOTE — Progress Notes (Signed)
°  Subjective:  Patient ID: BARTON WANT, male    DOB: [birth year],   MRN: [id number]  Chief Complaint  Patient presents with   Nail Problem    L foot sore nail, nail check       75 y.o. male presents for follow-up or right great ingrown toenail procedure. Relates it is doing well. Does states he has some soreness on the end of the toe. States now his left second toe is starting to get sore and wanted it checked out. Has been trying soaks and cream to relief pain.  Denies any other pedal complaints. Denies n/v/f/c.   Past Medical History:  Diagnosis Date   Actinic keratosis 12/16/2010   Qualifier: Diagnosis of  By: [name]  MD, [name]    BPH (benign prostatic hyperplasia)    Cancer [facility])    thyroid cancer 1990   Cataract    both eyes   History of radiation therapy 1990   HYPERLIPIDEMIA 12/26/2008   HYPOTHYROIDISM 12/26/2008   PPD positive    THYROID CANCER, HX OF 1990   THYROID CANCER, HX OF 12/26/2008   Qualifier: Diagnosis of  By: [name]  MD, [name]     Objective:  Physical Exam: Vascular: DP/PT pulses 2/4 bilateral. CFT <3 seconds. Normal hair growth on digits. No edema.  Skin. No lacerations or abrasions bilateral feet. No erythema or edema. No incurvation noted. No pain. Left second toe with thickness and incurvation into the distal aspect of the nail bed and tenderness. No erythema or edema noted.  Musculoskeletal: MMT 5/5 bilateral lower extremities in DF, PF, Inversion and Eversion. Deceased ROM in DF of ankle joint.  Neurological: Sensation intact to light touch.   Assessment:   1. Ingrown nail of great toe of right foot   2. Ingrown nail of second toe of left foot       Plan:  Patient was evaluated and treated and all questions answered. Toe was evaluated and appears to be healing well.  Right hallux nail and left second nail were debrided as courtesy today.  Relates to continue soaking and neosporin as needed.  Patient to follow-up as needed.       Lorenda Peck, DPM

## 2021-11-16 IMAGING — CT CT ANGIO CHEST
3 of 8 series · 18 of 46 positions shown · IV contrast (OMNIPAQUE 350)
Comparison: Coronary CTA 03/15/2020

CLINICAL DATA: Thoracic aortic aneurysm follow-up.

EXAM:
CT ANGIOGRAPHY CHEST WITH CONTRAST
TECHNIQUE: Multidetector CT imaging of the chest was performed using the
standard protocol during bolus administration of intravenous
contrast. Multiplanar CT image reconstructions and MIPs were
obtained to evaluate the vascular anatomy.
CONTRAST:  100mL OMNIPAQUE IOHEXOL 350 MG/ML SOLN

[Series 4: aorta 3.0 bf37 2 · axial · 0.81mm/px · z∈[-320,-34]mm · 13 of 111 slices shown]
[im 8/111  lung]
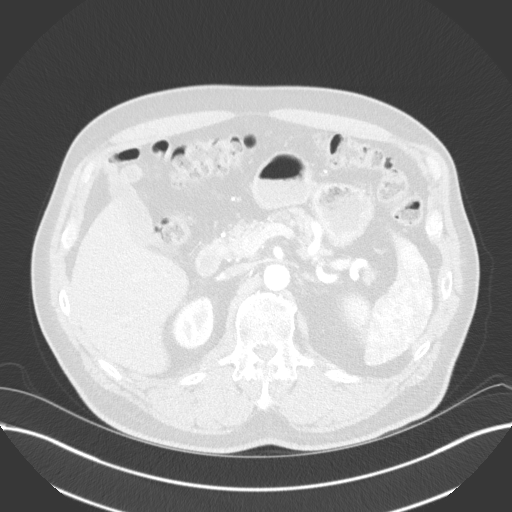
[im 16/111  soft-tissue]
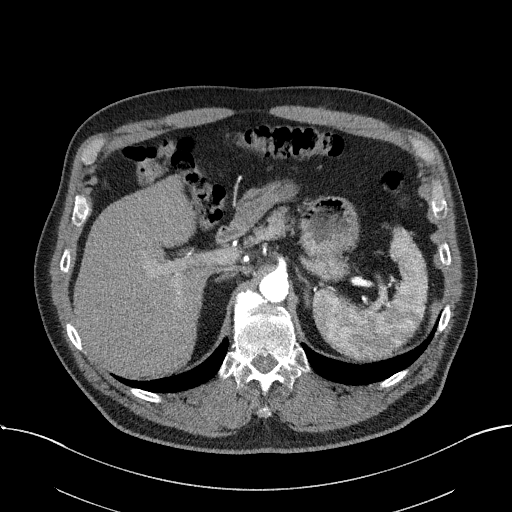
[im 24/111  lung]
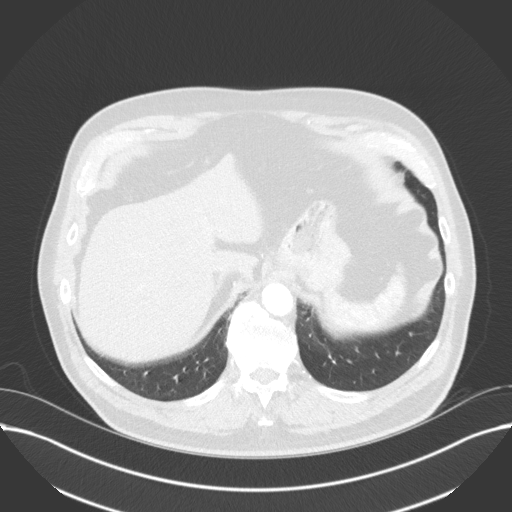
[im 32/111  soft-tissue]
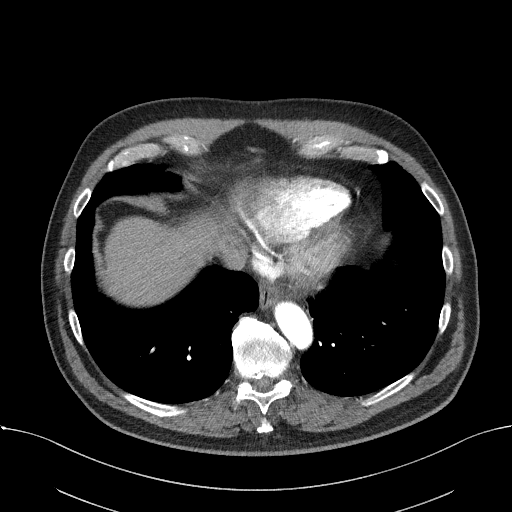
[im 40/111  lung]
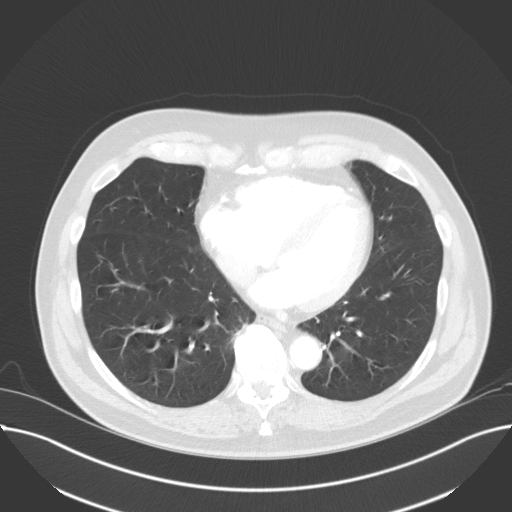
[im 48/111  soft-tissue]
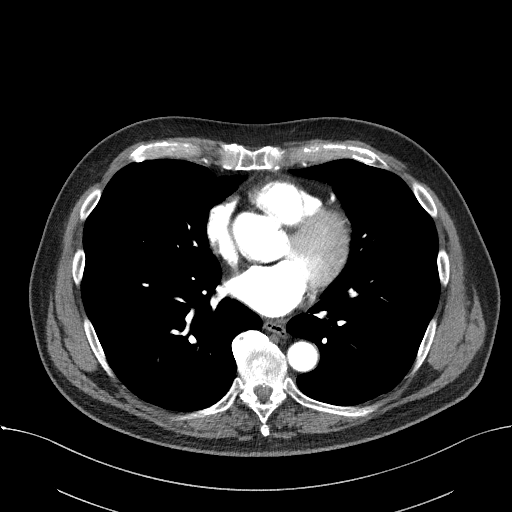
[im 56/111  lung]
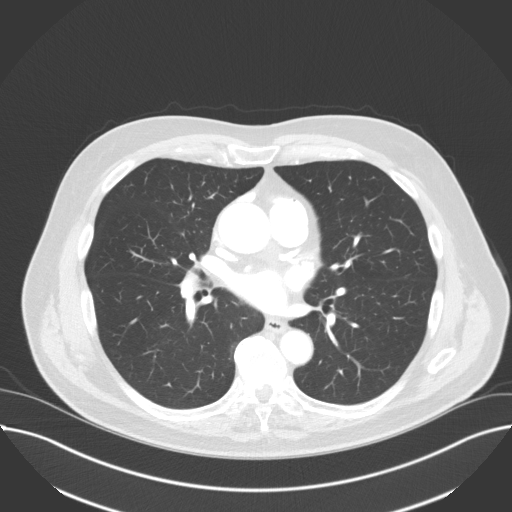
[im 63/111  soft-tissue]
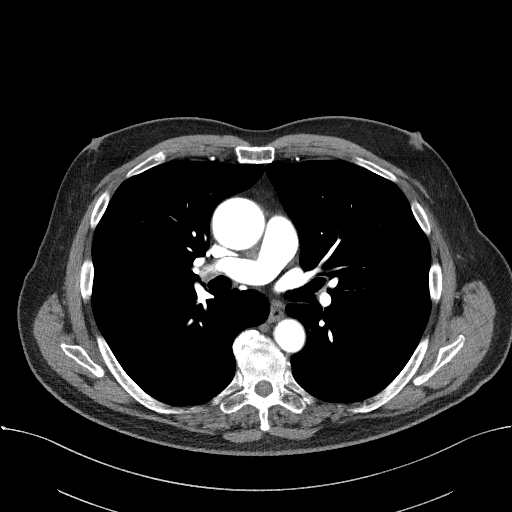
[im 71/111  lung]
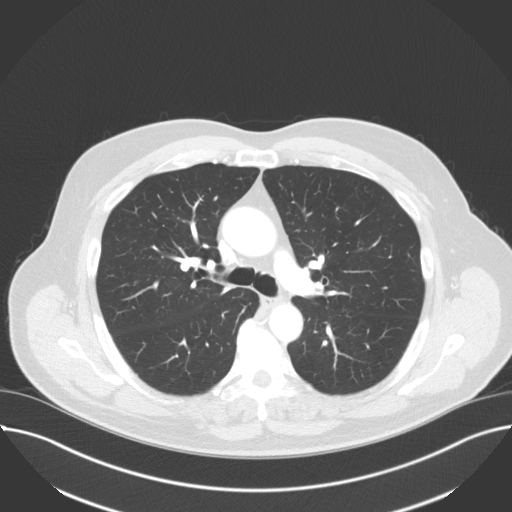
[im 79/111  soft-tissue]
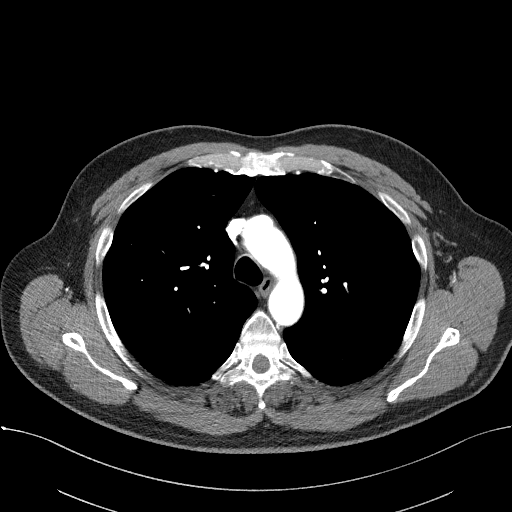
[im 87/111  lung]
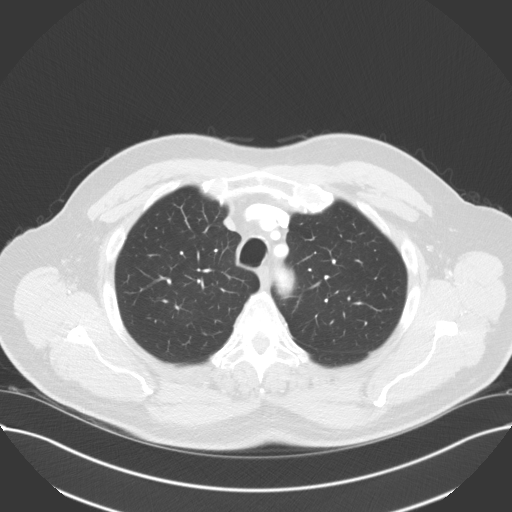
[im 95/111  soft-tissue]
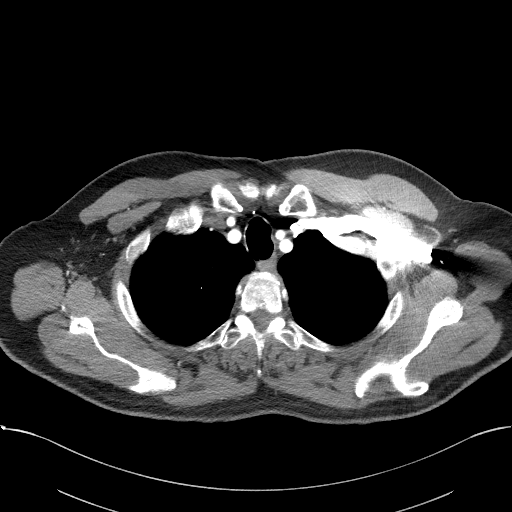
[im 103/111  lung]
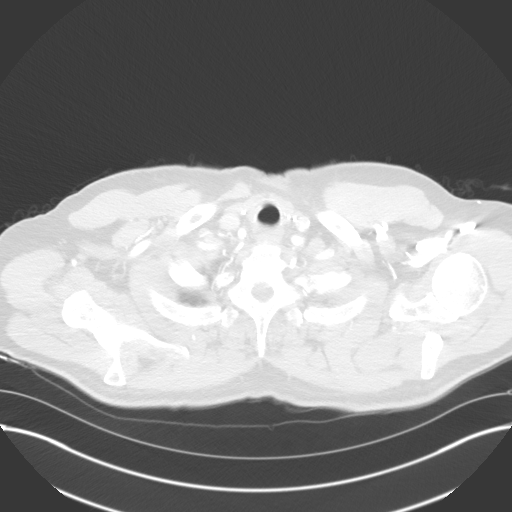

[Series 5: lung · axial · 0.81mm/px · z∈[-320,-272]mm · 2 of 111 slices shown]
[im 8/111  soft-tissue]
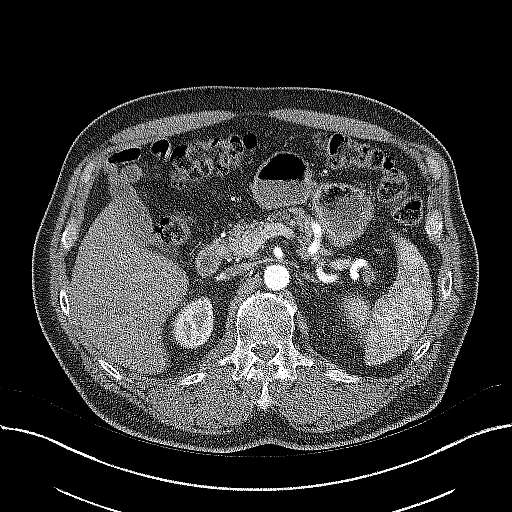
[im 24/111  soft-tissue]
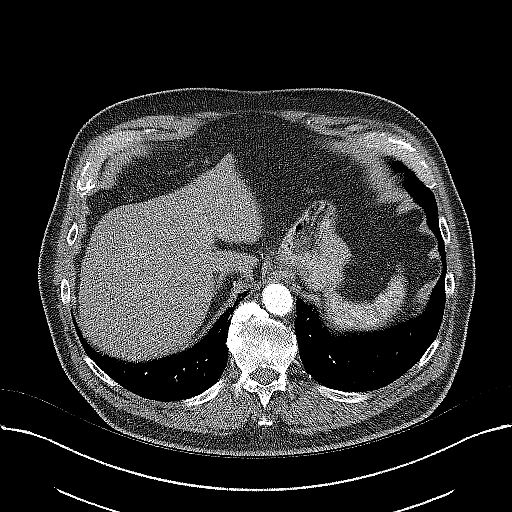

[Series 7: coronals · coronal · 0.67mm/px · 3 of 131 slices shown]
[im 33/131  soft-tissue]
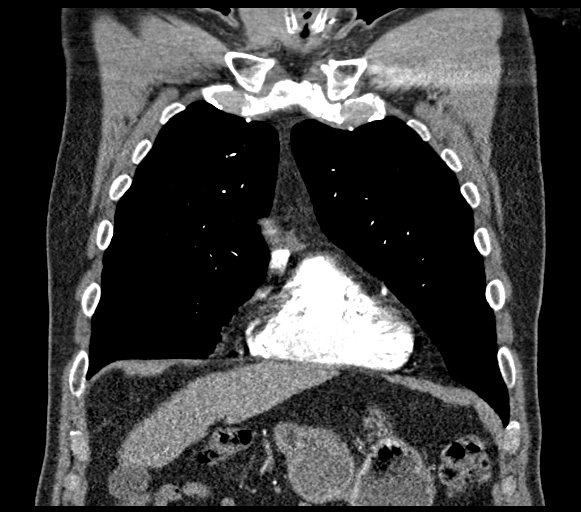
[im 66/131  soft-tissue]
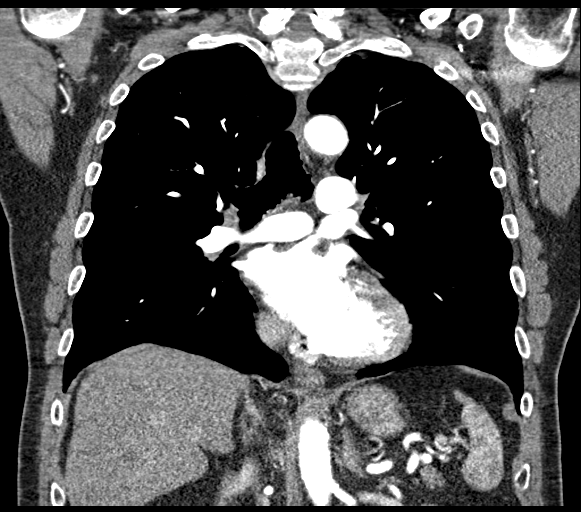
[im 98/131  soft-tissue]
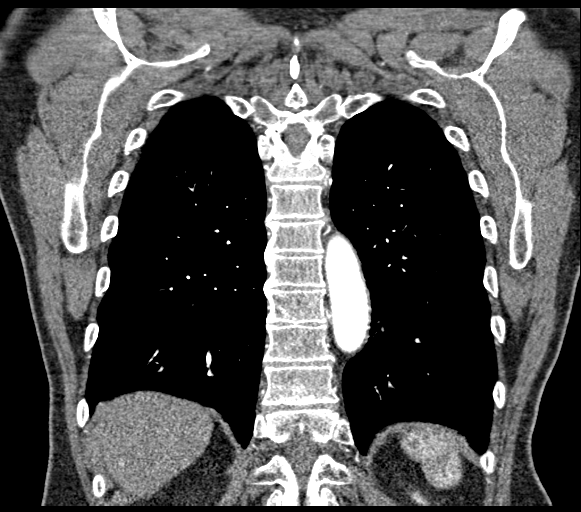

[18 of 46 positions shown; findings below may reference images not displayed]

FINDINGS: Cardiovascular: Mild dilatation of the ascending thoracic aorta
measuring up to 4.0 cm and stable. No evidence for an aortic
dissection. Typical three-vessel arch anatomy. The great vessels are
patent. Proximal descending thoracic aorta measures 2.8 cm. Mid
descending thoracic aorta measures 2.4 cm and stable. Distal
descending thoracic aorta measures 2.2 cm and stable. Proximal
bilateral renal arteries are patent. Celiac trunk and main branches
are patent. Proximal SMA is patent. Heart size is normal. No
significant pericardial effusion. Main and lobar pulmonary arteries
are patent.

Mediastinum/Nodes: Evidence for total thyroidectomy. No mediastinal
or hilar lymph node enlargement. No axillary lymph node enlargement.

Lungs/Pleura: Evidence for small diverticula along the posterior
aspect of the trachea particularly near the thoracic inlet. Trachea
and mainstem bronchi are patent. No large pleural effusions. 4-5 mm
pleural-based nodular density in the right upper lobe on sequence 5
image 22 is indeterminate. No significant airspace disease or lung
consolidation.

Upper Abdomen: No acute abnormality involving the upper abdomen.

Musculoskeletal: No acute bone abnormality.

Review of the MIP images confirms the above findings.
IMPRESSION: 1. Stable fusiform aneurysm of the ascending thoracic aorta
measuring up to 4.0 cm. Recommend annual imaging followup by CTA or
MRA. This recommendation follows 0515
ACCF/AHA/AATS/ACR/ASA/SCA/JIM/LINAAD/FINLAYSON/BALLEW Guidelines for the
Diagnosis and Management of Patients with Thoracic Aortic Disease.
Circulation. 0515; 121: E266-e369. Aortic aneurysm NOS (2C19V-TP3.H)
2. No acute chest abnormality.
3. Small pleural-based nodular density in the right upper lobe
measuring roughly 5 mm. This is indeterminate. No follow-up needed
if patient is low-risk. Non-contrast chest CT can be considered in
12 months if patient is high-risk. This recommendation follows the
consensus statement: Guidelines for Management of Incidental
Pulmonary Nodules Detected on CT Images: From the [HOSPITAL]
4. Evidence for small tracheal diverticula. Recommend attention on
follow-up imaging.

## 2021-11-28 ENCOUNTER — Encounter: Payer: Self-pay | Admitting: Podiatry

## 2021-11-28 ENCOUNTER — Ambulatory Visit: Payer: Medicare HMO | Admitting: Podiatry

## 2021-11-28 ENCOUNTER — Other Ambulatory Visit: Payer: Self-pay

## 2021-11-28 DIAGNOSIS — L600 Ingrowing nail: Secondary | ICD-10-CM

## 2021-11-28 NOTE — Progress Notes (Signed)
°  Subjective:  Patient ID: Carlos David, male    DOB: 1946/01/03,   MRN: [id number]  Chief Complaint  Patient presents with   Nail Problem     R great toe ingrown nail , patient states toenail is sore and tender , left great toenail is doing much better.      76 y.o. male presents for follow-up or right great ingrown toenail. Relates thickness in the nail and pressure on the distal part of his toe that is painful. The side of the ingrown procedure is not painful. States the trimming on the left side toe was helpful before.    Denies any other pedal complaints. Denies n/v/f/c.   Past Medical History:  Diagnosis Date   Actinic keratosis 12/16/2010   Qualifier: Diagnosis of  By: [name]  MD, [name]    BPH (benign prostatic hyperplasia)    Cancer [facility])    thyroid cancer 1990   Cataract    both eyes   History of radiation therapy 1990   HYPERLIPIDEMIA 12/26/2008   HYPOTHYROIDISM 12/26/2008   PPD positive    THYROID CANCER, HX OF 1990   THYROID CANCER, HX OF 12/26/2008   Qualifier: Diagnosis of  By: [name]  MD, [name]     Objective:  Physical Exam: Vascular: DP/PT pulses 2/4 bilateral. CFT <3 seconds. Normal hair growth on digits. No edema.  Skin. No lacerations or abrasions bilateral feet. No erythema or edema. No incurvation noted. No pain. Right hallux toe with thickness and incurvation into the distal aspect of the nail bed and tenderness. No erythema or edema noted.  Musculoskeletal: MMT 5/5 bilateral lower extremities in DF, PF, Inversion and Eversion. Deceased ROM in DF of ankle joint.  Neurological: Sensation intact to light touch.   Assessment:   1. Ingrown nail of great toe of right foot        Plan:  Patient was evaluated and treated and all questions answered. Toe was evaluated and appears to be healing well.  Right hallux nail debrided as courtesy today.  Relates to continue soaking and neosporin as needed. Discussed if continued pain can discuss removal  of the nail.  Patient to follow-up as needed.      Lorenda Peck, DPM

## 2021-12-12 DIAGNOSIS — L570 Actinic keratosis: Secondary | ICD-10-CM | POA: Diagnosis not present

## 2021-12-12 DIAGNOSIS — L814 Other melanin hyperpigmentation: Secondary | ICD-10-CM | POA: Diagnosis not present

## 2021-12-12 DIAGNOSIS — Z08 Encounter for follow-up examination after completed treatment for malignant neoplasm: Secondary | ICD-10-CM | POA: Diagnosis not present

## 2021-12-12 DIAGNOSIS — D2262 Melanocytic nevi of left upper limb, including shoulder: Secondary | ICD-10-CM | POA: Diagnosis not present

## 2021-12-12 DIAGNOSIS — Z86006 Personal history of melanoma in-situ: Secondary | ICD-10-CM | POA: Diagnosis not present

## 2021-12-12 DIAGNOSIS — D485 Neoplasm of uncertain behavior of skin: Secondary | ICD-10-CM | POA: Diagnosis not present

## 2021-12-12 DIAGNOSIS — L821 Other seborrheic keratosis: Secondary | ICD-10-CM | POA: Diagnosis not present

## 2021-12-12 DIAGNOSIS — D225 Melanocytic nevi of trunk: Secondary | ICD-10-CM | POA: Diagnosis not present

## 2021-12-30 DIAGNOSIS — M48062 Spinal stenosis, lumbar region with neurogenic claudication: Secondary | ICD-10-CM | POA: Diagnosis not present

## 2021-12-30 DIAGNOSIS — M47816 Spondylosis without myelopathy or radiculopathy, lumbar region: Secondary | ICD-10-CM | POA: Diagnosis not present

## 2022-01-10 ENCOUNTER — Ambulatory Visit (INDEPENDENT_AMBULATORY_CARE_PROVIDER_SITE_OTHER): Payer: Medicare HMO | Admitting: Family Medicine

## 2022-01-10 ENCOUNTER — Encounter: Payer: Self-pay | Admitting: Family Medicine

## 2022-01-10 ENCOUNTER — Other Ambulatory Visit: Payer: Self-pay

## 2022-01-10 VITALS — BP 136/84 | HR 76 | Temp 98.1°F | Ht 70.0 in | Wt 204.4 lb

## 2022-01-10 DIAGNOSIS — I10 Essential (primary) hypertension: Secondary | ICD-10-CM

## 2022-01-10 DIAGNOSIS — R1031 Right lower quadrant pain: Secondary | ICD-10-CM

## 2022-01-10 DIAGNOSIS — M545 Low back pain, unspecified: Secondary | ICD-10-CM | POA: Diagnosis not present

## 2022-01-10 DIAGNOSIS — E785 Hyperlipidemia, unspecified: Secondary | ICD-10-CM | POA: Diagnosis not present

## 2022-01-10 DIAGNOSIS — G8929 Other chronic pain: Secondary | ICD-10-CM

## 2022-01-10 DIAGNOSIS — E039 Hypothyroidism, unspecified: Secondary | ICD-10-CM

## 2022-01-10 NOTE — Patient Instructions (Signed)
It was very nice to see you today!  We will refer you to see the surgeon.  No other changes today.   Please come back to see me when you are due for your annual physical.  Come back sooner if needed.   Take care, Dr Jerline Pain  PLEASE NOTE:  If you had any lab tests please let us know if you have not heard back within a few days. You may see your results on mychart before we have a chance to review them but we will give you a call once they are reviewed by [place]. If we ordered any referrals today, please let us know if you have not heard from their office within the next week.   Please try these tips to maintain a healthy lifestyle:  Eat at least 3 REAL meals and 1-2 snacks per day.  Aim for no more than 5 hours between eating.  If you eat breakfast, please do so within one hour of getting up.   Each meal should contain half fruits/vegetables, one quarter protein, and one quarter carbs (no bigger than a computer mouse)  Cut down on sweet beverages. This includes juice, soda, and sweet tea.   Drink at least 1 glass of water with each meal and aim for at least 8 glasses per day  Exercise at least 150 minutes every week.

## 2022-01-10 NOTE — Assessment & Plan Note (Signed)
At goal on losartan 100mg daily.  

## 2022-01-10 NOTE — Assessment & Plan Note (Signed)
Last TSH at goal on synthroid 131mcg daily.

## 2022-01-10 NOTE — Assessment & Plan Note (Signed)
Last LDL at goal on lipitor 40mg  daily.

## 2022-01-10 NOTE — Progress Notes (Signed)
u  Carlos David is a 76 y.o. male who presents today for an office visit.  Assessment/Plan:  New/Acute Problems: Right Groin Pain Concern for inguinal hernia. No red flags. Will place referral to surgery for further management. Discussed reasons to return to care.   Chronic Problems Addressed Today: Essential hypertension At goal on losartan 100mg  daily.   Dyslipidemia Last LDL at goal on lipitor 40mg  daily.   Hypothyroidism s/p thyroidectomy 1990 due to thyroid cancer Last TSH at goal on synthroid 125mcg daily.   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 Gets routine epidural steroid injections.      Subjective:  HPI:  Patient with pain in his right groin. Started several months ago. Got better for awhile but seems to have worsened within the last week or so after doing a lot of yard work. HE has noticed swelling andd burning.  Tried using an ice pack and it went down. Does not have much symptoms today. Tried using a belt from [employer] which did not work well. Pain described as a burning sensation.        Objective:  Physical Exam: BP 136/84 (BP Location: Right Arm)    Pulse 76    Temp 98.1 F (36.7 C) (Temporal)    Ht 5\' 10"  (1.778 m)    Wt 204 lb 6.4 oz (92.7 kg)    SpO2 98%    BMI 29.33 kg/m   Gen: No acute distress, resting comfortably CV: Regular rate and rhythm with no murmurs appreciated Pulm: Normal work of breathing, clear to auscultation bilaterally with no crackles, wheezes, or rhonchi GU: Obvious bulge in right inguinal area. Worsened with valsalva.  Neuro: Grossly normal, moves all extremities Psych: Normal affect and thought content      Sharece Fleischhacker M. Jerline Pain, MD 01/10/2022 11:18 AM

## 2022-01-10 NOTE — Assessment & Plan Note (Signed)
Follows with neurosurgery. Gets routine epidural steroid injections.

## 2022-01-15 DIAGNOSIS — M47816 Spondylosis without myelopathy or radiculopathy, lumbar region: Secondary | ICD-10-CM | POA: Diagnosis not present

## 2022-01-16 ENCOUNTER — Telehealth: Payer: Self-pay | Admitting: Family Medicine

## 2022-01-16 NOTE — Telephone Encounter (Signed)
Patient called re referral to sx for hernia.- patient would like a call back with updates.  ? ?Note: Lattie Haw and Tammy aware.   ? ? ? ?

## 2022-01-18 ENCOUNTER — Other Ambulatory Visit: Payer: Self-pay | Admitting: Family Medicine

## 2022-01-24 DIAGNOSIS — M25551 Pain in right hip: Secondary | ICD-10-CM | POA: Diagnosis not present

## 2022-01-24 DIAGNOSIS — M25552 Pain in left hip: Secondary | ICD-10-CM | POA: Diagnosis not present

## 2022-01-28 DIAGNOSIS — M5416 Radiculopathy, lumbar region: Secondary | ICD-10-CM | POA: Diagnosis not present

## 2022-01-31 DIAGNOSIS — M25552 Pain in left hip: Secondary | ICD-10-CM | POA: Diagnosis not present

## 2022-02-04 NOTE — Progress Notes (Signed)
?Cardiology Office Note:   ? ?Date:  02/06/2022  ? ?ID:  Carlos David, DOB July 01, 1946, MRN [id number] ? ?PCP:  Vivi Barrack, MD  ?Cardiologist:  None  ?Electrophysiologist:  None  ? ?Referring MD: Vivi Barrack, MD  ? ?Chief Complaint  ?Patient presents with  ? Coronary Artery Disease  ? ? ? ?History of Present Illness:   ? ?Carlos David is a 76 y.o. male with a hx of thyroid cancer now hypothyroidism, hyperlipidemia who presents for follow-up.  He was referred by [name], PA-C for evaluation of chest pain, initially seen on 01/19/2020.  He reported that he has been having chest pain for the preceding few months.  Occured daily.  Chest pain is located under his left breast.  Describes as pinching pain.  Can last for few minutes.  Typically occurs at rest.  Reports that he exercises at the [facility], will do the elliptical for 30 minutes.  Has not noted exertional chest pain.  Does report some lightheadedness with standing.  Denies any syncope, palpitations, lower extremity edema.  Smoked cigarettes as teenager for few years.  Smoked pipe for 10 years.  Mother had MI in 66s.   ? ?TTE on 02/06/2020 showed normal biventricular function, mild mitral regurgitation, dilatation of the ascending aorta measuring 44 mm.  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  Lexiscan Myoview on 08/07/2021 showed normal perfusion, EF 51%. ? ?Since last clinic visit, he reports has been doing okay.  He was recently found to have right inguinal hernia, planning surgery.  Denies any chest pain, lightheadedness, syncope, lower extremity edema, or palpitations.  Does report occasional dyspnea.  Prior to recent issues with hernia, he was going to gym 3-4 times per week.  Reports he rode exercise bike for 30 to 45 minutes this morning.  Denies any exertional  symptoms.  Does report he has been having numbness in his legs. ? ?Past Medical History:  ?Diagnosis Date  ? Actinic keratosis 12/16/2010  ? Qualifier: Diagnosis of  By: [name]  MD, [name]   ? BPH (benign prostatic hyperplasia)   ? Cancer [facility])   ? thyroid cancer 1990  ? Cataract   ? both eyes  ? History of radiation therapy 1990  ? HYPERLIPIDEMIA 12/26/2008  ? HYPOTHYROIDISM 12/26/2008  ? PPD positive   ? THYROID CANCER, HX OF 1990  ? THYROID CANCER, HX OF 12/26/2008  ? Qualifier: Diagnosis of  By: [name]  MD, [name]   ? ? ?Past Surgical History:  ?Procedure Laterality Date  ? CATARACT EXTRACTION, BILATERAL  2020  ? COLONOSCOPY    ? 10-ye ago-normal at [facility]  ? EYE SURGERY    ? as child  ? HAND SURGERY    ? right hand trigger figger   ? neckotomy  1990  ? 20 lymph nodes removed from left neck  ? TOTAL THYROIDECTOMY  1990  ? TRANSURETHRAL RESECTION OF PROSTATE N/A 01/21/2019  ? Procedure: TRANSURETHRAL RESECTION OF THE PROSTATE (TURP);  Surgeon: [name], MD;  [facility];  Service: Urology;  Laterality: N/A;  75 MINS  ? ? ?Current Medications: ?Current Meds  ?Medication Sig  ? aspirin EC 81 MG tablet Take 1 tablet (81 mg total) by mouth daily. Swallow whole.  ? atorvastatin (LIPITOR) 40 MG tablet TAKE 1 TABLET  BY MOUTH EVERY DAY  ? finasteride (PROSCAR) 5 MG tablet TAKE 1 TABLET BY MOUTH EVERY DAY  ? gabapentin (NEURONTIN) 300 MG capsule Take 1 capsule by mouth 2 (two) times daily.  ? hydrocortisone 2.5 % cream   ? levothyroxine (SYNTHROID) 137 MCG tablet TAKE 1 TABLET BY MOUTH EVERY DAY  ? losartan (COZAAR) 100 MG tablet TAKE 1 TABLET BY MOUTH EVERY DAY  ? meloxicam (MOBIC) 15 MG tablet Take 1 tablet (15 mg total) by mouth as needed.  ? sildenafil (REVATIO) 20 MG tablet TAKE 1 TABLET (20 MG TOTAL) BY MOUTH 3 (THREE) TIMES DAILY AS NEEDED.  ?  ? ?Allergies:   Penicillins  ? ?Social History  ? ?Socioeconomic History  ? Marital status: Married  ?  Spouse name: Not on file  ? Number  of children: Not on file  ? Years of education: Not on file  ? Highest education level: Not on file  ?Occupational History  ? Not on file  ?Tobacco Use  ? Smoking status: Former  ?  Types: Cigarettes, Pipe  ?  Quit date: 11/17/1968  ?  Years since quitting: 53.2  ? Smokeless tobacco: Never  ?Vaping Use  ? Vaping Use: Never used  ?Substance and Sexual Activity  ? Alcohol use: Yes  ?  Alcohol/week: 0.0 standard drinks  ?  Comment: occas. beer  ? Drug use: No  ? Sexual activity: Not on file  ?Other Topics Concern  ? Not on file  ?Social History Narrative  ? Not on file  ? ?Social Determinants of Health  ? ?Financial Resource Strain: Low Risk   ? Difficulty of Paying Living Expenses: Not hard at all  ?Food Insecurity: No Food Insecurity  ? Worried About Charity fundraiser in the Last Year: Never true  ? Ran Out of Food in the Last Year: Never true  ?Transportation Needs: No Transportation Needs  ? Lack of Transportation (Medical): No  ? Lack of Transportation (Non-Medical): No  ?Physical Activity: Sufficiently Active  ? Days of Exercise per Week: 3 days  ? Minutes of Exercise per Session: 120 min  ?Stress: No Stress Concern Present  ? Feeling of Stress : Not at all  ?Social Connections: Moderately Integrated  ? Frequency of Communication with Friends and Family: More than three times a week  ? Frequency of Social Gatherings with Friends and Family: More than three times a week  ? Attends Religious Services: More than 4 times per year  ? Active Member of Clubs or Organizations: No  ? Attends Archivist Meetings: Never  ? Marital Status: Married  ?  ? ?Family History: ?The patient's family history is negative for Colon cancer. ? ?ROS:   ?Please see the history of present illness.    ? ?All other systems reviewed and are negative. ? ?EKGs/Labs/Other Studies Reviewed:   ? ?The following studies were reviewed today: ? ? ?EKG:   ?02/06/22: NSR, rate 68. LAD, no ST abnormalities ?07/30/2021: Normal sinus rhythm, rate  74, no ST abnormalities ?04/22/2021: NSR, rate 65, No ST abnormalities ?01/19/2020: normal sinus rhythm, rate 69, left axis deviation, no ST/T abnormalities ? ?Lexiscan Myoview 11/28/16: ?Nuclear stress EF: 54%. ?The study is normal. ?This is a low risk study. ?The left ventricular ejection fraction is mildly decreased (45-54%). ?There was no ST segment deviation noted during stress. ?  ?Normal resting and stress perfusion. No ischemia or infarction EF ?54% ?  ?Coronary CTA 03/15/20: ?IMPRESSION: ?1. Coronary calcium score of 526.  This was 108 percentile for age and ?sex matched control. ?  ?2. Normal coronary origin with right dominance. ?  ?3. CAD-RADS 3. Mild stenosis in the proximal and moderate stenosis ?in the mid LAD. Consider symptom-guided anti-ischemic ?pharmacotherapy as well as risk factor modification per guideline ?directed care. Additional analysis with CT FFR will be submitted. ?  ?4. 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 No ?dissection. ?  ?5. Mildly dilated pulmonary artery measuring 33 mm. ? ?1. Left Main: 0.99. ?  ?2. LAD: Proximal: 0.96, mid: 0.86, distal: 0.83. ?3. LCX: 0.94 ?4. OM1: 0.96. ?5. OM2: 9.97. ?6. RCA: Proximal: 0.98, distal: 0.96. ?  ?IMPRESSION: ?1. CT FFR analysis didn't show any significant stenosis. Aggressive ?medical management is recommended. ? ?  ?IMPRESSION: ?1.  No acute findings in the imaged extracardiac chest. ?2.  Aortic Atherosclerosis (ICD10-I70.0). ?  ?TTE 02/06/20: ? 1. Left ventricular ejection fraction, by estimation, is 60 to 65%. The  ?left ventricle has normal function. The left ventricle has no regional  ?wall motion abnormalities. There is mild asymmetric left ventricular  ?hypertrophy. Left ventricular diastolic  ?parameters are indeterminate.  ? 2. Right ventricular systolic function is normal. The right ventricular  ?size is normal.  ? 3. The mitral valve is normal in structure. Mild mitral valve  ?regurgitation. No evidence of mitral  stenosis.  ? 4. The aortic valve is normal in structure. Aortic valve regurgitation is  ?not visualized. No aortic stenosis is present.  ? 5. Aortic dilatation noted. There is mild to moderate dilatation of the  ?asc

## 2022-02-05 DIAGNOSIS — K4090 Unilateral inguinal hernia, without obstruction or gangrene, not specified as recurrent: Secondary | ICD-10-CM | POA: Diagnosis not present

## 2022-02-06 ENCOUNTER — Ambulatory Visit: Payer: Medicare HMO | Admitting: Cardiology

## 2022-02-06 ENCOUNTER — Other Ambulatory Visit: Payer: Self-pay

## 2022-02-06 ENCOUNTER — Encounter: Payer: Self-pay | Admitting: Cardiology

## 2022-02-06 VITALS — BP 142/80 | HR 68 | Ht 70.0 in | Wt 205.2 lb

## 2022-02-06 DIAGNOSIS — R911 Solitary pulmonary nodule: Secondary | ICD-10-CM | POA: Diagnosis not present

## 2022-02-06 DIAGNOSIS — I10 Essential (primary) hypertension: Secondary | ICD-10-CM | POA: Diagnosis not present

## 2022-02-06 DIAGNOSIS — R200 Anesthesia of skin: Secondary | ICD-10-CM

## 2022-02-06 DIAGNOSIS — I77819 Aortic ectasia, unspecified site: Secondary | ICD-10-CM

## 2022-02-06 DIAGNOSIS — Z01810 Encounter for preprocedural cardiovascular examination: Secondary | ICD-10-CM

## 2022-02-06 DIAGNOSIS — E785 Hyperlipidemia, unspecified: Secondary | ICD-10-CM

## 2022-02-06 DIAGNOSIS — I2510 Atherosclerotic heart disease of native coronary artery without angina pectoris: Secondary | ICD-10-CM

## 2022-02-06 NOTE — Patient Instructions (Signed)
Medication Instructions:  ?Your physician recommends that you continue on your current medications as directed. Please refer to the Current Medication list given to you today. ? ?*If you need a refill on your cardiac medications before your next appointment, please call your pharmacy* ? ? ?Lab Work: ?BMET today ? ?If you have labs (blood work) drawn today and your tests are completely normal, you will receive your results only by: ?MyChart Message (if you have MyChart) OR ?A paper copy in the mail ?If you have any lab test that is abnormal or we need to change your treatment, we will call you to review the results. ? ? ?Testing/Procedures: ?CTA chest/aorta due in April ? ?Your physician has requested that you have an ankle brachial index (ABI). During this test an ultrasound and blood pressure cuff are used to evaluate the arteries that supply the arms and legs with blood. Allow thirty minutes for this exam. There are no restrictions or special instructions. ? ?Follow-Up: ?At [facility], you and your health needs are our priority.  As part of our continuing mission to provide you with exceptional heart care, we have created designated Provider Care Teams.  These Care Teams include your primary Cardiologist (physician) and Advanced Practice Providers (APPs -  Physician Assistants and Nurse Practitioners) who all work together to provide you with the care you need, when you need it. ? ?We recommend signing up for the patient portal called "MyChart".  Sign up information is provided on this After Visit Summary.  MyChart is used to connect with patients for Virtual Visits (Telemedicine).  Patients are able to view lab/test results, encounter notes, upcoming appointments, etc.  Non-urgent messages can be sent to your provider as well.   ?To learn more about what you can do with MyChart, go to [email or web address].   ? ?Your next appointment:   ?6 month(s) ? ?The format for your next appointment:   ?In  Person ? ?Provider:   ?Dr. Gardiner Rhyme ? ?Other Instructions ?Please check your blood pressure at home daily, write it down.  Call the office or send message via Mychart with the readings in 1 week for Dr. Gardiner Rhyme to review.  ? ? ?

## 2022-02-07 LAB — BASIC METABOLIC PANEL
BUN/Creatinine Ratio: 24 (ref 10–24)
BUN: 29 mg/dL — ABNORMAL HIGH (ref 8–27)
CO2: 21 mmol/L (ref 20–29)
Calcium: 8.8 mg/dL (ref 8.6–10.2)
Chloride: 99 mmol/L (ref 96–106)
Creatinine, Ser: 1.22 mg/dL (ref 0.76–1.27)
Glucose: 92 mg/dL (ref 70–99)
Potassium: 4.6 mmol/L (ref 3.5–5.2)
Sodium: 135 mmol/L (ref 134–144)
eGFR: 61 mL/min/{1.73_m2} (ref >59)

## 2022-02-11 ENCOUNTER — Encounter: Payer: Self-pay | Admitting: *Deleted

## 2022-02-12 DIAGNOSIS — M5416 Radiculopathy, lumbar region: Secondary | ICD-10-CM | POA: Diagnosis not present

## 2022-02-17 ENCOUNTER — Encounter (HOSPITAL_BASED_OUTPATIENT_CLINIC_OR_DEPARTMENT_OTHER): Payer: Self-pay | Admitting: General Surgery

## 2022-02-17 ENCOUNTER — Ambulatory Visit (INDEPENDENT_AMBULATORY_CARE_PROVIDER_SITE_OTHER): Payer: Medicare HMO | Admitting: Family Medicine

## 2022-02-17 ENCOUNTER — Other Ambulatory Visit: Payer: Self-pay

## 2022-02-17 ENCOUNTER — Encounter: Payer: Self-pay | Admitting: Family Medicine

## 2022-02-17 VITALS — BP 120/76 | HR 79 | Temp 97.9°F | Ht 70.0 in | Wt 199.8 lb

## 2022-02-17 DIAGNOSIS — I10 Essential (primary) hypertension: Secondary | ICD-10-CM | POA: Diagnosis not present

## 2022-02-17 DIAGNOSIS — E039 Hypothyroidism, unspecified: Secondary | ICD-10-CM | POA: Diagnosis not present

## 2022-02-17 DIAGNOSIS — K4090 Unilateral inguinal hernia, without obstruction or gangrene, not specified as recurrent: Secondary | ICD-10-CM | POA: Diagnosis not present

## 2022-02-17 DIAGNOSIS — R0989 Other specified symptoms and signs involving the circulatory and respiratory systems: Secondary | ICD-10-CM

## 2022-02-17 NOTE — Assessment & Plan Note (Signed)
No signs of recurrence of thyroid cancer. Lat TSH at goal on synthroid 171mg daily.  ?

## 2022-02-17 NOTE — Progress Notes (Signed)
Spoke w/ via phone for pre-op interview: patient ?Lab needs dos: none (no surgeon orders yet) ?Lab results: EKG 01/17/22, BMP 02/06/22, Lexiscan Myoview 08/07/21 ?COVID test: patient states asymptomatic no test needed. ?Arrive at 0930 02/25/22 ?NPO after MN except clear liquids.Clear liquids from MN until 0830 ?Med rec completed. ?Medications to take morning of surgery: Synthroid, Gabapentin, and finasteride ?Diabetic medication: NA ?Patient instructed to bring photo id and insurance card day of surgery. ?Patient aware to have Driver (ride ) / caregiver for 24 hours after surgery. (Wife to drive) ?Patient Special Instructions: follow surgeon and cardiologist instructions regarding wether to hold aspirin and for how long ?Pre-Op special Istructions: NA ?Patient verbalized understanding of instructions that were given at this phone interview. ?Patient denies shortness of breath, chest pain, fever, cough at this phone interview.  ?

## 2022-02-17 NOTE — Patient Instructions (Signed)
It was very nice to see you today! ? ?I think you are probably having allergies and post nasal drip. Please trying an over the counter medication such as allegra for 1-2 weeks and let me know if not improving. ? ?If not improving we will refer you to see ENT.  ? ?Take care, ?Dr Jerline Pain ? ?PLEASE NOTE: ? ?If you had any lab tests please let us know if you have not heard back within a few days. You may see your results on mychart before we have a chance to review them but we will give you a call once they are reviewed by [place]. If we ordered any referrals today, please let us know if you have not heard from their office within the next week.  ? ?Please try these tips to maintain a healthy lifestyle: ? ?Eat at least 3 REAL meals and 1-2 snacks per day.  Aim for no more than 5 hours between eating.  If you eat breakfast, please do so within one hour of getting up.  ? ?Each meal should contain half fruits/vegetables, one quarter protein, and one quarter carbs (no bigger than a computer mouse) ? ?Cut down on sweet beverages. This includes juice, soda, and sweet tea.  ? ?Drink at least 1 glass of water with each meal and aim for at least 8 glasses per day ? ?Exercise at least 150 minutes every week.   ?

## 2022-02-17 NOTE — Assessment & Plan Note (Signed)
At goal on losartan 100mg daily.  

## 2022-02-17 NOTE — Progress Notes (Signed)
? ?  Carlos David is a 76 y.o. male who presents today for an office visit. ? ?Assessment/Plan:  ?New/Acute Problems: ?Throat Clearing ?Likely due to seasonal allergies and post nasal drip. We discussed trial of antihistamine vs referral to ENT. Given his symptoms have only been present for a couple of weeks and his physical exam findings we will try over the counter antihistamine such as claritin or zyrtec for a couple of weeks. He will let me know if not improving and we will refer to ENT. Discussed reasons to return to care.  ? ?Inguinal Hernia ?No red flags. Will be undergoing surgery soon.  ? ?Chronic Problems Addressed Today: ?Hypothyroidism s/p thyroidectomy 1990 due to thyroid cancer ?No signs of recurrence of thyroid cancer. Lat TSH at goal on synthroid 175mg daily.  ? ?Essential hypertension ?At goal on losartan '100mg'$  daily.  ? ? ?  ?Subjective:  ?HPI: ? ?Patient here with concern for frequent throat clearing. Started over the last several weeks. Associated with a raspy voice. No sore throat. He is concerned because he has a history of thyroid cancer in the remote past. Tried using mouthwash with some improvement. He has not noticed any worsening allergies. His symptoms come and go and are not persistent. He occasionally gets some nasal drainage. No specific treatments tried.  ? ?He will be having inguinal hernia repair in a couple of weeks.   ? ?See A/p for status of chronic conditions.  ? ?   ?  ?Objective:  ?Physical Exam: ?BP 120/76 (BP Location: Left Arm)   Pulse 79   Temp 97.9 ?F (36.6 ?C) (Temporal)   Ht '5\' 10"'$  (1.778 m)   Wt 199 lb 12.8 oz (90.6 kg)   SpO2 96%   BMI 28.67 kg/m?   ?Gen: No acute distress, resting comfortably ?HEENT: TMs clear.  OP erythematous.  Nosal mucosa erythematous with clear discharge. ?CV: Regular rate and rhythm with no murmurs appreciated ?Pulm: Normal work of breathing, clear to auscultation bilaterally with no crackles, wheezes, or rhonchi ?Neuro: Grossly normal,  moves all extremities ?Psych: Normal affect and thought content ? ?   ? ?CAlgis Greenhouse PJerline Pain MD ?02/17/2022 8:25 AM  ?

## 2022-02-19 DIAGNOSIS — M25572 Pain in left ankle and joints of left foot: Secondary | ICD-10-CM | POA: Diagnosis not present

## 2022-02-20 ENCOUNTER — Ambulatory Visit: Payer: Medicare HMO | Admitting: Podiatry

## 2022-02-20 ENCOUNTER — Ambulatory Visit: Payer: Self-pay | Admitting: General Surgery

## 2022-02-25 ENCOUNTER — Ambulatory Visit (HOSPITAL_BASED_OUTPATIENT_CLINIC_OR_DEPARTMENT_OTHER): Payer: Medicare HMO | Admitting: Anesthesiology

## 2022-02-25 ENCOUNTER — Encounter (HOSPITAL_BASED_OUTPATIENT_CLINIC_OR_DEPARTMENT_OTHER): Payer: Self-pay | Admitting: General Surgery

## 2022-02-25 ENCOUNTER — Other Ambulatory Visit: Payer: Self-pay

## 2022-02-25 ENCOUNTER — Encounter (HOSPITAL_BASED_OUTPATIENT_CLINIC_OR_DEPARTMENT_OTHER)
Admission: RE | Disposition: A | Discharge status: Home / Self Care | Payer: Self-pay | Source: Ambulatory Visit | Attending: General Surgery

## 2022-02-25 ENCOUNTER — Ambulatory Visit (HOSPITAL_BASED_OUTPATIENT_CLINIC_OR_DEPARTMENT_OTHER)
Admission: RE | Admit: 2022-02-25 | Discharge: 2022-02-25 | Disposition: A | Discharge status: Home / Self Care | Payer: Medicare HMO | Source: Ambulatory Visit | Attending: General Surgery | Admitting: General Surgery

## 2022-02-25 DIAGNOSIS — E785 Hyperlipidemia, unspecified: Secondary | ICD-10-CM | POA: Insufficient documentation

## 2022-02-25 DIAGNOSIS — I10 Essential (primary) hypertension: Secondary | ICD-10-CM | POA: Insufficient documentation

## 2022-02-25 DIAGNOSIS — E039 Hypothyroidism, unspecified: Secondary | ICD-10-CM | POA: Insufficient documentation

## 2022-02-25 DIAGNOSIS — G8918 Other acute postprocedural pain: Secondary | ICD-10-CM | POA: Diagnosis not present

## 2022-02-25 DIAGNOSIS — Z87891 Personal history of nicotine dependence: Secondary | ICD-10-CM | POA: Insufficient documentation

## 2022-02-25 DIAGNOSIS — K4090 Unilateral inguinal hernia, without obstruction or gangrene, not specified as recurrent: Secondary | ICD-10-CM | POA: Insufficient documentation

## 2022-02-25 HISTORY — DX: Essential (primary) hypertension: I10

## 2022-02-25 HISTORY — DX: Thoracic aortic aneurysm, without rupture, unspecified: I71.20

## 2022-02-25 HISTORY — PX: INGUINAL HERNIA REPAIR: SHX194

## 2022-02-25 SURGERY — REPAIR, HERNIA, INGUINAL, ADULT
Anesthesia: General | Laterality: Right

## 2022-02-25 MED ORDER — FENTANYL CITRATE (PF) 100 MCG/2ML IJ SOLN
INTRAMUSCULAR | Status: AC
  Filled 2022-02-25: qty 2

## 2022-02-25 MED ORDER — MIDAZOLAM HCL 2 MG/2ML IJ SOLN
INTRAMUSCULAR | Status: AC
  Filled 2022-02-25: qty 2

## 2022-02-25 MED ORDER — OXYCODONE HCL 5 MG/5ML PO SOLN: 5.0000 mg | Freq: Once | ORAL | Status: AC | PRN

## 2022-02-25 MED ORDER — DEXAMETHASONE SODIUM PHOSPHATE 10 MG/ML IJ SOLN
INTRAMUSCULAR | Status: DC | PRN
  Administered 2022-02-25: 4 mg
  Administered 2022-02-25: 10 mg

## 2022-02-25 MED ORDER — FENTANYL CITRATE (PF) 100 MCG/2ML IJ SOLN
INTRAMUSCULAR | Status: AC
Start: 2022-02-25 — End: ?
  Filled 2022-02-25: qty 2

## 2022-02-25 MED ORDER — FENTANYL CITRATE (PF) 100 MCG/2ML IJ SOLN
25.0000 ug | INTRAMUSCULAR | Status: DC | PRN
  Administered 2022-02-25: 50 ug via INTRAVENOUS

## 2022-02-25 MED ORDER — SUGAMMADEX SODIUM 200 MG/2ML IV SOLN
INTRAVENOUS | Status: DC | PRN
Start: 2022-02-25 — End: 2022-02-25
  Administered 2022-02-25: 200 mg via INTRAVENOUS

## 2022-02-25 MED ORDER — DEXAMETHASONE SODIUM PHOSPHATE 10 MG/ML IJ SOLN
INTRAMUSCULAR | Status: AC
  Filled 2022-02-25: qty 1

## 2022-02-25 MED ORDER — ONDANSETRON HCL 4 MG/2ML IJ SOLN: 4.0000 mg | Freq: Once | INTRAMUSCULAR | Status: DC | PRN

## 2022-02-25 MED ORDER — ENSURE PRE-SURGERY PO LIQD: 296.0000 mL | Freq: Once | ORAL | Status: DC

## 2022-02-25 MED ORDER — FENTANYL CITRATE (PF) 100 MCG/2ML IJ SOLN
INTRAMUSCULAR | Status: DC | PRN
  Administered 2022-02-25: 50 ug via INTRAVENOUS

## 2022-02-25 MED ORDER — ACETAMINOPHEN 325 MG PO TABS: 325.0000 mg | ORAL_TABLET | ORAL | Status: DC | PRN

## 2022-02-25 MED ORDER — ONDANSETRON HCL 4 MG/2ML IJ SOLN
INTRAMUSCULAR | Status: AC
  Filled 2022-02-25: qty 2

## 2022-02-25 MED ORDER — PROPOFOL 10 MG/ML IV BOLUS
INTRAVENOUS | Status: AC
  Filled 2022-02-25: qty 20

## 2022-02-25 MED ORDER — OXYCODONE HCL 5 MG PO TABS
5.0000 mg | ORAL_TABLET | Freq: Once | ORAL | Status: AC | PRN
  Administered 2022-02-25: 5 mg via ORAL

## 2022-02-25 MED ORDER — CEFAZOLIN SODIUM-DEXTROSE 2-4 GM/100ML-% IV SOLN
2.0000 g | INTRAVENOUS | Status: AC
  Administered 2022-02-25: 2 g via INTRAVENOUS

## 2022-02-25 MED ORDER — ACETAMINOPHEN 500 MG PO TABS
ORAL_TABLET | ORAL | Status: AC
  Filled 2022-02-25: qty 2

## 2022-02-25 MED ORDER — EPHEDRINE 5 MG/ML INJ
INTRAVENOUS | Status: AC
  Filled 2022-02-25: qty 5

## 2022-02-25 MED ORDER — OXYCODONE HCL 5 MG PO TABS: 5.0000 mg | ORAL_TABLET | Freq: Four times a day (QID) | ORAL | 0 refills | Status: DC | PRN

## 2022-02-25 MED ORDER — ACETAMINOPHEN 160 MG/5ML PO SOLN: 325.0000 mg | ORAL | Status: DC | PRN

## 2022-02-25 MED ORDER — MEPERIDINE HCL 25 MG/ML IJ SOLN: 6.2500 mg | INTRAMUSCULAR | Status: DC | PRN

## 2022-02-25 MED ORDER — CELECOXIB 200 MG PO CAPS
400.0000 mg | ORAL_CAPSULE | ORAL | Status: AC
  Administered 2022-02-25: 400 mg via ORAL

## 2022-02-25 MED ORDER — ROCURONIUM BROMIDE 10 MG/ML (PF) SYRINGE
PREFILLED_SYRINGE | INTRAVENOUS | Status: DC | PRN
  Administered 2022-02-25: 60 mg via INTRAVENOUS

## 2022-02-25 MED ORDER — LIDOCAINE 2% (20 MG/ML) 5 ML SYRINGE
INTRAMUSCULAR | Status: DC | PRN
Start: 2022-02-25 — End: 2022-02-25
  Administered 2022-02-25: 100 mg via INTRAVENOUS

## 2022-02-25 MED ORDER — ROPIVACAINE HCL 7.5 MG/ML IJ SOLN
INTRAMUSCULAR | Status: DC | PRN
  Administered 2022-02-25: 25 mL via PERINEURAL

## 2022-02-25 MED ORDER — CEFAZOLIN SODIUM-DEXTROSE 2-4 GM/100ML-% IV SOLN
INTRAVENOUS | Status: AC
  Filled 2022-02-25: qty 100

## 2022-02-25 MED ORDER — FENTANYL CITRATE (PF) 100 MCG/2ML IJ SOLN
100.0000 ug | Freq: Once | INTRAMUSCULAR | Status: AC
  Administered 2022-02-25: 100 ug via INTRAVENOUS

## 2022-02-25 MED ORDER — LIDOCAINE HCL (PF) 2 % IJ SOLN
INTRAMUSCULAR | Status: AC
Start: 2022-02-25 — End: ?
  Filled 2022-02-25: qty 5

## 2022-02-25 MED ORDER — OXYCODONE HCL 5 MG PO TABS
ORAL_TABLET | ORAL | Status: AC
  Filled 2022-02-25: qty 1

## 2022-02-25 MED ORDER — MIDAZOLAM HCL 2 MG/2ML IJ SOLN
2.0000 mg | Freq: Once | INTRAMUSCULAR | Status: AC
  Administered 2022-02-25: 2 mg via INTRAVENOUS

## 2022-02-25 MED ORDER — LACTATED RINGERS IV SOLN: INTRAVENOUS | Status: DC

## 2022-02-25 MED ORDER — CLONIDINE HCL (ANALGESIA) 100 MCG/ML EP SOLN
EPIDURAL | Status: DC | PRN
  Administered 2022-02-25: 50 ug

## 2022-02-25 MED ORDER — CHLORHEXIDINE GLUCONATE CLOTH 2 % EX PADS: 6.0000 | MEDICATED_PAD | Freq: Once | CUTANEOUS | Status: DC

## 2022-02-25 MED ORDER — EPHEDRINE SULFATE-NACL 50-0.9 MG/10ML-% IV SOSY
PREFILLED_SYRINGE | INTRAVENOUS | Status: DC | PRN
Start: 2022-02-25 — End: 2022-02-25
  Administered 2022-02-25: 10 mg via INTRAVENOUS

## 2022-02-25 MED ORDER — ACETAMINOPHEN 500 MG PO TABS
1000.0000 mg | ORAL_TABLET | ORAL | Status: AC
  Administered 2022-02-25: 1000 mg via ORAL

## 2022-02-25 MED ORDER — CHLORHEXIDINE GLUCONATE CLOTH 2 % EX PADS
6.0000 | MEDICATED_PAD | Freq: Once | CUTANEOUS | Status: DC
Start: 2022-02-25 — End: 2022-02-25

## 2022-02-25 MED ORDER — PROPOFOL 10 MG/ML IV BOLUS
INTRAVENOUS | Status: DC | PRN
  Administered 2022-02-25: 200 mg via INTRAVENOUS

## 2022-02-25 MED ORDER — 0.9 % SODIUM CHLORIDE (POUR BTL) OPTIME
TOPICAL | Status: DC | PRN
  Administered 2022-02-25: 500 mL

## 2022-02-25 MED ORDER — CELECOXIB 200 MG PO CAPS
ORAL_CAPSULE | ORAL | Status: AC
  Filled 2022-02-25: qty 2

## 2022-02-25 MED ORDER — ONDANSETRON HCL 4 MG/2ML IJ SOLN
INTRAMUSCULAR | Status: DC | PRN
  Administered 2022-02-25: 4 mg via INTRAVENOUS

## 2022-02-25 SURGICAL SUPPLY — 52 items
ADH SKN CLS APL DERMABOND .7 (GAUZE/BANDAGES/DRESSINGS) ×1
APL PRP STRL LF DISP 70% ISPRP (MISCELLANEOUS) ×1
BLADE CLIPPER SENSICLIP SURGIC (BLADE) ×2 IMPLANT
BLADE SURG 15 STRL LF DISP TIS (BLADE) ×2 IMPLANT
BLADE SURG 15 STRL SS (BLADE) ×2
CELLS DAT CNTRL 66122 CELL SVR (MISCELLANEOUS) IMPLANT
CHLORAPREP W/TINT 26 (MISCELLANEOUS) ×3 IMPLANT
COVER BACK TABLE 60X90IN (DRAPES) ×3 IMPLANT
COVER MAYO STAND STRL (DRAPES) ×3 IMPLANT
DERMABOND ADVANCED (GAUZE/BANDAGES/DRESSINGS) ×1
DERMABOND ADVANCED .7 DNX12 (GAUZE/BANDAGES/DRESSINGS) ×2 IMPLANT
DRAIN PENROSE 0.25X18 (DRAIN) ×1 IMPLANT
DRAPE LAPAROSCOPIC ABDOMINAL (DRAPES) ×3 IMPLANT
DRAPE UTILITY XL STRL (DRAPES) ×3 IMPLANT
ELECT REM PT RETURN 9FT ADLT (ELECTROSURGICAL) ×2
ELECTRODE REM PT RTRN 9FT ADLT (ELECTROSURGICAL) ×2 IMPLANT
GAUZE 4X4 16PLY ~~LOC~~+RFID DBL (SPONGE) ×3 IMPLANT
GLOVE BIOGEL PI IND STRL 7.0 (GLOVE) ×2 IMPLANT
GLOVE BIOGEL PI INDICATOR 7.0 (GLOVE) ×1
GLOVE SURG SS PI 7.0 STRL IVOR (GLOVE) ×3 IMPLANT
GLOVE SURG UNDER POLY LF SZ6.5 (GLOVE) ×1 IMPLANT
GOWN STRL REUS W/TWL LRG LVL3 (GOWN DISPOSABLE) ×4 IMPLANT
KIT TURNOVER CYSTO (KITS) ×3 IMPLANT
MANIFOLD NEPTUNE II (INSTRUMENTS) ×1 IMPLANT
MESH HERNIA 3X6 (Mesh General) ×1 IMPLANT
NDL HYPO 25X1 1.5 SAFETY (NEEDLE) ×2 IMPLANT
NEEDLE HYPO 25X1 1.5 SAFETY (NEEDLE) ×2 IMPLANT
NS IRRIG 500ML POUR BTL (IV SOLUTION) ×3 IMPLANT
PACK BASIN DAY SURGERY FS (CUSTOM PROCEDURE TRAY) ×3 IMPLANT
PAD ARMBOARD 7.5X6 YLW CONV (MISCELLANEOUS) ×3 IMPLANT
PENCIL SMOKE EVACUATOR (MISCELLANEOUS) ×3 IMPLANT
RETRACTOR WND ALEXIS 18 MED (MISCELLANEOUS) IMPLANT
RTRCTR WOUND ALEXIS 18CM MED (MISCELLANEOUS)
SPONGE T-LAP 4X18 ~~LOC~~+RFID (SPONGE) IMPLANT
SUT MNCRL AB 4-0 PS2 18 (SUTURE) ×3 IMPLANT
SUT PDS AB 0 CT1 36 (SUTURE) IMPLANT
SUT PDS AB 2-0 CT2 27 (SUTURE) IMPLANT
SUT PROLENE 2 0 CT2 30 (SUTURE) ×6 IMPLANT
SUT SILK 3 0 TIES 17X18 (SUTURE)
SUT SILK 3-0 18XBRD TIE BLK (SUTURE) IMPLANT
SUT VIC AB 2-0 SH 18 (SUTURE) ×3 IMPLANT
SUT VIC AB 2-0 SH 27 (SUTURE) ×2
SUT VIC AB 2-0 SH 27XBRD (SUTURE) ×2 IMPLANT
SUT VIC AB 3-0 SH 27 (SUTURE) ×2
SUT VIC AB 3-0 SH 27X BRD (SUTURE) ×2 IMPLANT
SUT VICRYL 2 0 18  UND BR (SUTURE) ×2
SUT VICRYL 2 0 18 UND BR (SUTURE) ×2 IMPLANT
SYR BULB IRRIG 60ML STRL (SYRINGE) ×3 IMPLANT
SYR CONTROL 10ML LL (SYRINGE) ×3 IMPLANT
TOWEL OR 17X26 10 PK STRL BLUE (TOWEL DISPOSABLE) ×3 IMPLANT
TUBE CONNECTING 12X1/4 (SUCTIONS) ×3 IMPLANT
YANKAUER SUCT BULB TIP NO VENT (SUCTIONS) ×3 IMPLANT

## 2022-02-25 NOTE — Op Note (Signed)
Preop diagnosis: right inguinal hernia ? ?Postop diagnosis: right inguinal hernia ? ?Procedure: open Right inguinal hernia repair with mesh ? ?Surgeon: Gurney Maxin, M.D. ? ?Asst: Cameron Sprang, M.D. ? ?Anesthesia: Gen.  ? ?Indications for procedure: Carlos David is a 76 y.o. male with symptoms of pain and enlarging Right inguinal hernia(s). After discussing risks, alternatives and benefits he decided on open repair and was brought to day surgery for repair. ? ?Description of procedure: The patient was brought into the operative suite, placed supine. Anesthesia was administered with endotracheal tube. Patient was strapped in place. The patient was prepped and draped in the usual sterile fashion. ? ?The anterior superior iliac spine and pubic tubercle were identified on the Right side. An incision was made 1cm above the connecting line, representative of the location of the inguinal ligament. The subcutaneous tissue was bluntly dissected, scarpa's fascia was dissected away. The external abdominal oblique fascia was identified and sharply opened down to the external inguinal ring. The conjoint tendon and inguinal ligament were identified. The cord structures and sac were dissected free of the surrounding tissue in 360 degrees. A penrose drain was used to encircle the contents. The cremasteric fibers were dissected free of the contents of the cord and hernia sac. The cord structures (vessels and vas deferens) were identified and carefully dissected away from the hernia sac. The hernia sac was reduced and contained no visceral structures.The hernia sac was dissected down to the internal inguinal ring. Preperitoneal fat was identified showing appropriate dissection. The resected and closed with running 3-0 vicryl. The sac was then reduced into the preperitoneal space. The hernia was indirect. A 3x6 Bard mesh was then used to close the defect and reinforce the floor. The mesh was sutured to the lacunar ligament and  inguinal ligament using a 2-0 prolene in running fashion. Next the superior edge of the mesh was sutured to the conjoined tendon using a 2-0 running Prolene. An additional 2-0 Prolene was used to suture the tail ends of the mesh together re-creating the deep ring. Cord structures are running in a neutral position through the mesh. Next the external abdominal oblique fascia was closed with a 2-0 Vicryl in interrupted fashion to re-create the external inguinal ring. Scarpa's fascia was closed with 3-0 Vicryl in running fashion. Skin was closed with a 4-0 Monocryl subcuticular stitch in running fashion. Dermabond place for dressing. Patient woke from anesthesia and brought to PACU in stable condition. All counts are correct. ? ? ? ?Findings: right indirect inguinal hernia ? ?Specimen: none ? ?Blood loss: 20 ml ? ?Local anesthesia: preop TAP block ? ?Complications: none ? ?Implant: 3 x 6 in Bard mesh ? ?Gurney Maxin, M.D. ?General, Bariatric, & Minimally Invasive Surgery ?[facility], [place] ?1:33 PM ?02/25/2022 ? ?

## 2022-02-25 NOTE — Anesthesia Procedure Notes (Signed)
Procedure Name: Intubation ?Date/Time: 02/25/2022 12:16 PM ?Performed by: Mechele Claude, CRNA ?Pre-anesthesia Checklist: Patient identified, Emergency Drugs available, Suction available and Patient being monitored ?Patient Re-evaluated:Patient Re-evaluated prior to induction ?Oxygen Delivery Method: Circle system utilized ?Preoxygenation: Pre-oxygenation with 100% oxygen ?Induction Type: IV induction ?Ventilation: Mask ventilation without difficulty ?Laryngoscope Size: Mac and 4 ?Grade View: Grade I ?Tube type: Oral ?Tube size: 7.5 mm ?Number of attempts: 1 ?Airway Equipment and Method: Stylet and Oral airway ?Placement Confirmation: ETT inserted through vocal cords under direct vision, positive ETCO2 and breath sounds checked- equal and bilateral ?Secured at: 22 cm ?Tube secured with: Tape ?Dental Injury: Teeth and Oropharynx as per pre-operative assessment  ? ? ? ? ?

## 2022-02-25 NOTE — Transfer of Care (Signed)
Immediate Anesthesia Transfer of Care Note ? ?Patient: Carlos David ? ?Procedure(s) Performed: Procedure(s) (LRB): ?OPEN RIGHT INGUINAL HERNIA REPAIR WITH MESH (Right) ? ?Patient Location: PACU ? ?Anesthesia Type: General ? ?Level of Consciousness: awake, alert  and oriented ? ?Airway & Oxygen Therapy: Patient Spontanous Breathing ? ?Post-op Assessment: Report given to PACU RN and Post -op Vital signs reviewed and stable ? ?Post vital signs: Reviewed and stable ? ?Complications: No apparent anesthesia complications ? ?Last Vitals:  ?Vitals Value Taken Time  ?BP 132/75 02/25/22 1348  ?Temp    ?Pulse 66 02/25/22 1352  ?Resp 14 02/25/22 1352  ?SpO2 96 % 02/25/22 1352  ?Vitals shown include unvalidated device data. ? ?Last Pain:  ?Vitals:  ? 02/25/22 1024  ?TempSrc: Oral  ?PainSc: 0-No pain  ?   ? ?Patients Stated Pain Goal: 7 (02/25/22 1024) ? ?Complications: No notable events documented. ?

## 2022-02-25 NOTE — Anesthesia Procedure Notes (Signed)
Anesthesia Regional Block: TAP block  ? ?Pre-Anesthetic Checklist: , timeout performed,  Correct Patient, Correct Site, Correct Laterality,  Correct Procedure, Correct Position, site marked,  Risks and benefits discussed,  Surgical consent,  Pre-op evaluation,  At surgeon's request and post-op pain management ? ?Laterality: Right ? ?Prep: chloraprep     ?  ?Needles:  ?Injection technique: Single-shot ? ?Needle Type: Echogenic Stimulator Needle   ? ? ?Needle Length: 5cm  ?Needle Gauge: 22  ? ? ? ?Additional Needles: ? ? ?Procedures:, nerve stimulator,,, ultrasound used (permanent image in chart),,    ?Narrative:  ?Start time: 02/25/2022 11:00 AM ?End time: 02/25/2022 11:05 AM ?Injection made incrementally with aspirations every 5 mL. ? ?Performed by: Personally  ?Anesthesiologist: Janeece Riggers, MD ? ?Additional Notes: ?Functioning IV was confirmed and monitors were applied.  A 49m 22ga Arrow echogenic stimulator needle was used. Sterile prep and drape,hand hygiene and sterile gloves were used. Ultrasound guidance: relevant anatomy identified, needle position confirmed, local anesthetic spread visualized around nerve(s)., vascular puncture avoided.  Image printed for medical record. Negative aspiration and negative test dose prior to incremental administration of local anesthetic. The patient tolerated the procedure well. ? ? ? ? ? ?

## 2022-02-25 NOTE — Anesthesia Procedure Notes (Signed)
Procedure Name: Intubation ?Date/Time: 02/25/2022 12:16 PM ?Performed by: Mechele Claude, CRNA ?Pre-anesthesia Checklist: Patient identified, Emergency Drugs available, Suction available and Patient being monitored ?Patient Re-evaluated:Patient Re-evaluated prior to induction ?Oxygen Delivery Method: Circle system utilized ?Preoxygenation: Pre-oxygenation with 100% oxygen ?Induction Type: IV induction ?Ventilation: Mask ventilation without difficulty ?Laryngoscope Size: Mac and 4 ?Grade View: Grade I ?Tube type: Oral ?Tube size: 7.5 mm ?Number of attempts: 1 ?Airway Equipment and Method: Stylet ?Placement Confirmation: ETT inserted through vocal cords under direct vision, positive ETCO2 and breath sounds checked- equal and bilateral ?Secured at: 21 cm ?Tube secured with: Tape ?Dental Injury: Teeth and Oropharynx as per pre-operative assessment  ? ? ? ? ?

## 2022-02-25 NOTE — Anesthesia Preprocedure Evaluation (Addendum)
Anesthesia Evaluation  ?Patient identified by MRN, date of birth, ID band ?Patient awake ? ? ? ?Reviewed: ?Allergy & Precautions, NPO status , Patient's Chart, lab work & pertinent test results ? ?History of Anesthesia Complications ?Negative for: history of anesthetic complications ? ?Airway ?Mallampati: II ? ?TM Distance: >3 FB ?Neck ROM: Full ? ? ? Dental ?no notable dental hx. ?(+) Teeth Intact, Caps, Dental Advisory Given ?  ?Pulmonary ?neg pulmonary ROS, former smoker,  ?  ?Pulmonary exam normal ? ? ? ? ? ? ? Cardiovascular ?hypertension, Pt. on medications ?negative cardio ROS ?Normal cardiovascular exam ? ? ?  ?Neuro/Psych ?negative neurological ROS ? negative psych ROS  ? GI/Hepatic ?negative GI ROS, Neg liver ROS,   ?Endo/Other  ?Hypothyroidism  ? Renal/GU ?negative Renal ROS  ?negative genitourinary ?  ?Musculoskeletal ? ?(+) Arthritis , Osteoarthritis,   ? Abdominal ?  ?Peds ? Hematology ?negative hematology ROS ?(+)   ?Anesthesia Other Findings ? ?- HLD, hypothyroid ?- Stress test 22': Myocardial perfusion is normal. The study is normal. This is a low risk study. Overall left ventricular systolic function was normal. LV cavity size is normal. Nuclear stress EF: 54%. The left ventricular ejection fraction is mildly decreased (45-54%).  ? Reproductive/Obstetrics ? ?  ? ? ? ? ? ? ? ? ? ? ? ? ? ?  ?  ? ? ? ? ? ? ? ?Anesthesia Physical ? ?Anesthesia Plan ? ?ASA: 2 ? ?Anesthesia Plan: General  ? ?Post-op Pain Management: Tylenol PO (pre-op)*, Celebrex PO (pre-op)* and Regional block*  ? ?Induction: Intravenous ? ?PONV Risk Score and Plan: 2 and Ondansetron, Dexamethasone and Treatment may vary due to age or medical condition ? ?Airway Management Planned: Oral ETT and LMA ? ?Additional Equipment: None ? ?Intra-op Plan:  ? ?Post-operative Plan: Extubation in OR ? ?Informed Consent: I have reviewed the patients History and Physical, chart, labs and discussed the procedure  including the risks, benefits and alternatives for the proposed anesthesia with the patient or authorized representative who has indicated his/her understanding and acceptance.  ? ? ? ?Dental advisory given ? ?Plan Discussed with: Anesthesiologist and CRNA ? ?Anesthesia Plan Comments:   ? ? ? ? ? ?Anesthesia Quick Evaluation ? ?

## 2022-02-25 NOTE — Discharge Instructions (Addendum)
[facility] _______[facility] ? ?UMBILICAL OR INGUINAL HERNIA REPAIR: POST OP INSTRUCTIONS ? ?Always review your discharge instruction sheet given to you by the facility where your surgery was performed. ?IF YOU HAVE DISABILITY OR FAMILY LEAVE FORMS, YOU MUST BRING THEM TO THE OFFICE FOR PROCESSING.   ?DO NOT GIVE THEM TO YOUR DOCTOR. ? ?1. A  prescription for pain medication may be given to you upon discharge.  Take your pain medication as prescribed, if needed.  If narcotic pain medicine is not needed, then you may take acetaminophen (Tylenol) or ibuprofen (Advil) as needed. ?2. Take your usually prescribed medications unless otherwise directed. ?If you need a refill on your pain medication, please contact your pharmacy.  They will contact our office to request authorization. Prescriptions will not be filled after 5 pm or on week-ends. ?3. You should follow a light diet the first 24 hours after arrival home, such as soup and crackers, etc.  Be sure to include lots of fluids daily.  Resume your normal diet the day after surgery. ?4.Most patients will experience some swelling and bruising around the umbilicus or in the groin and scrotum.  Ice packs and reclining will help.  Swelling and bruising can take several days to resolve.  ?6. It is common to experience some constipation if taking pain medication after surgery.  Increasing fluid intake and taking a stool softener (such as Colace) will usually help or prevent this problem from occurring.  A mild laxative (Milk of Magnesia or Miralax) should be taken according to package directions if there are no bowel movements after 48 hours. ?7. Unless discharge instructions indicate otherwise, you may remove your bandages 24-48 hours after surgery, and you may shower at that time.  You may have steri-strips (small skin tapes) in place directly over the incision.  These strips should be left on the skin for 7-10 days.  If your surgeon used skin glue on the  incision, you may shower in 24 hours.  The glue will flake off over the next 2-3 weeks.  Any sutures or staples will be removed at the office during your follow-up visit. ?8. ACTIVITIES:  You may resume regular (light) daily activities beginning the next day--such as daily self-care, walking, climbing stairs--gradually increasing activities as tolerated.  You may have sexual intercourse when it is comfortable.  Refrain from any heavy lifting or straining until approved by your doctor. ? ?a.You may drive when you are no longer taking prescription pain medication, you can comfortably wear a seatbelt, and you can safely maneuver your car and apply brakes. ?b.RETURN TO WORK:   ?_____________________________________________ ? ?9.You should see your doctor in the office for a follow-up appointment approximately 2-3 weeks after your surgery.  Make sure that you call for this appointment within a day or two after you arrive home to insure a convenient appointment time. ?10.OTHER INSTRUCTIONS: _________________________ ?   _____________________________________ ? ?WHEN TO CALL YOUR DOCTOR: ?Fever over 101.0 ?Inability to urinate ?Nausea and/or vomiting ?Extreme swelling or bruising ?Continued bleeding from incision. ?Increased pain, redness, or drainage from the incision ? ?The clinic staff is available to answer your questions during regular business hours.  Please don?t hesitate to call and ask to speak to one of the nurses for clinical concerns.  If you have a medical emergency, go to the nearest emergency room or call 911.  A surgeon from [facility] is always on call at the hospital ? ? ?[address] [place],  [place], [place]  [address] ? ? [address], [place], [place]   [address] ?[phone] ? [phone] ? FAX [phone] ?Web site: [email or web address] ? ? ? ?Post Anesthesia Home Care Instructions ? ?Activity: ?Get plenty of rest for the remainder of the day. A responsible  individual must stay with you for 24 hours following the procedure.  ?For the next 24 hours, DO NOT: ?-Drive a car ?-Paediatric nurse ?-Drink alcoholic beverages ?-Take any medication unless instructed by your physician ?-Make any legal decisions or sign important papers. ? ?Meals: ?Start with liquid foods such as gelatin or soup. Progress to regular foods as tolerated. Avoid greasy, spicy, heavy foods. If nausea and/or vomiting occur, drink only clear liquids until the nausea and/or vomiting subsides. Call your physician if vomiting continues. ? ?Special Instructions/Symptoms: ?Your throat may feel dry or sore from the anesthesia or the breathing tube placed in your throat during surgery. If this causes discomfort, gargle with warm salt water. The discomfort should disappear within 24 hours. ? ? ?Regional Anesthesia Blocks ? ?1. Numbness or the inability to move the "blocked" extremity may last from 3-48 hours after placement. The length of time depends on the medication injected and your individual response to the medication. If the numbness is not going away after 48 hours, call your surgeon. ? ?2. The extremity that is blocked will need to be protected until the numbness is gone and the  Strength has returned. Because you cannot feel it, you will need to take extra care to avoid injury. Because it may be weak, you may have difficulty moving it or using it. You may not know what position it is in without looking at it while the block is in effect. ? ?3. For blocks in the legs and feet, returning to weight bearing and walking needs to be done carefully. You will need to wait until the numbness is entirely gone and the strength has returned. You should be able to move your leg and foot normally before you try and bear weight or walk. You will need someone to be with you when you first try to ensure you do not fall and possibly risk injury. ? ?4. Bruising and tenderness at the needle site are common side effects  and will resolve in a few days. ? ?5. Persistent numbness or new problems with movement should be communicated to the surgeon. ? ?May take Tylenol beginning at 4:30 PM as needed for pain/discomfort. ? ?

## 2022-02-25 NOTE — H&P (Signed)
? ?Chief Complaint: Hernia ? ? ?History of Present Illness: ?Carlos David is a 76 y.o. male who is seen today as an office consultation at the request of Dr. Jerline Pain for evaluation of Hernia ?.  ? ?He first noticed the hernia 1 year ago. Symptoms returned 3 weeks ago. He has noticed a bulge. He has a burning pain in the area. He has been wearing a trus with good results. He denies nausea or vomiting or bowel habit change. ? ?He does not smoke ?He does not have diabetes ?He has no history of hernias ? ?Review of Systems: ?A complete review of systems was obtained from the patient. I have reviewed this information and discussed as appropriate with the patient. See HPI as well for other ROS. ? ?Review of Systems  ?Constitutional: Negative.  ?HENT: Negative.  ?Eyes: Negative.  ?Respiratory: Negative.  ?Cardiovascular: Negative.  ?Gastrointestinal: Negative.  ?Genitourinary: Negative.  ?Musculoskeletal: Negative.  ?Skin: Negative.  ?Neurological: Negative.  ?Endo/Heme/Allergies: Negative.  ?Psychiatric/Behavioral: Negative.  ? ? ?Medical History: ?Past Medical History:  ?Diagnosis Date  ? Arthritis  ? History of cancer  ? Hyperlipidemia  ? Thyroid disease  ? ?There is no problem list on file for this patient. ? ?Past Surgical History:  ?Procedure Laterality Date  ? prostate surgery N/A  ? thyroid cancer N/A  ? ? ?Allergies  ?Allergen Reactions  ? Penicillins Hives and Rash  ? ?Current Outpatient Medications on File Prior to Visit  ?Medication Sig Dispense Refill  ? aspirin 81 MG EC tablet Take by mouth  ? atorvastatin (LIPITOR) 40 MG tablet atorvastatin 40 mg tablet ?TAKE 1 TABLET BY MOUTH EVERY DAY  ? gabapentin (NEURONTIN) 300 MG capsule gabapentin 300 mg capsule ?TAKE 1 CAPSULE BY MOUTH TWICE A DAY  ? levothyroxine (SYNTHROID) 137 MCG tablet levothyroxine 137 mcg tablet ?TAKE 1 TABLET BY MOUTH EVERY DAY  ? losartan (COZAAR) 100 MG tablet losartan 100 mg tablet  ? meloxicam (MOBIC) 15 MG tablet meloxicam 15 mg  tablet ?TAKE 1 TABLET BY MOUTH EVERY DAY  ? ?No current facility-administered medications on file prior to visit.  ? ?History reviewed. No pertinent family history.  ? ?Social History  ? ?Tobacco Use  ?Smoking Status Never  ?Smokeless Tobacco Never  ? ? ?Social History  ? ?Socioeconomic History  ? Marital status: Married  ?Tobacco Use  ? Smoking status: Never  ? Smokeless tobacco: Never  ?Vaping Use  ? Vaping Use: Never used  ?Substance and Sexual Activity  ? Alcohol use: Yes  ? Drug use: Never  ? ?Objective:  ? ?Vitals:  ?02/05/22 1008  ?BP: (!) 162/90  ?Pulse: 76  ?Temp: 37 ?C (98.6 ?F)  ?SpO2: 96%  ?Weight: 92 kg (202 lb 12.8 oz)  ?Height: 177.8 cm ('5\' 10"'$ )  ? ?Body mass index is 29.1 kg/m?. ? ?Physical Exam ?Constitutional:  ?Appearance: Normal appearance.  ?HENT:  ?Head: Normocephalic and atraumatic.  ?Pulmonary:  ?Effort: Pulmonary effort is normal.  ?Abdominal:  ?Comments: Moderate right inguinal hernia, no left inguinal hernia  ?Musculoskeletal:  ?General: Normal range of motion.  ?Cervical back: Normal range of motion.  ?Neurological:  ?General: No focal deficit present.  ?Mental Status: He is alert and oriented to person, place, and time. Mental status is at baseline.  ?Psychiatric:  ?Mood and Affect: Mood normal.  ?Behavior: Behavior normal.  ?Thought Content: Thought content normal.  ? ? ? ?Labs, Imaging and Diagnostic Testing: ?I reviewed notes from Dimas Chyle ? ?Assessment and Plan:  ?There are  no diagnoses linked to this encounter.  ? ?We discussed etiology of hernias and how they can cause pain. We discussed options for inguinal hernia repair vs observation. We discussed details of the surgery of general anesthesia, surgical approach and incisions, dissecting the sack away from vas deference, testicular vessels and nerves and placement of mesh. We discussed risks of bleeding, infection, recurrence, injury to vas deference, testicular vessels, nerve injury, and chronic pain. He showed good  understanding and wanted proceed with open right inguinal hernia repair as outpatient.  ?

## 2022-02-25 NOTE — Anesthesia Postprocedure Evaluation (Signed)
Anesthesia Post Note ? ?Patient: Carlos David ? ?Procedure(s) Performed: OPEN RIGHT INGUINAL HERNIA REPAIR WITH MESH (Right) ? ?  ? ?Patient location during evaluation: PACU ?Anesthesia Type: General ?Level of consciousness: awake and alert ?Pain management: pain level controlled ?Vital Signs Assessment: post-procedure vital signs reviewed and stable ?Respiratory status: spontaneous breathing, nonlabored ventilation, respiratory function stable and patient connected to nasal cannula oxygen ?Cardiovascular status: blood pressure returned to baseline and stable ?Postop Assessment: no apparent nausea or vomiting ?Anesthetic complications: no ? ? ?No notable events documented. ? ?Last Vitals:  ?Vitals:  ? 02/25/22 1430 02/25/22 1625  ?BP: 108/71 (!) 143/80  ?Pulse: 64 62  ?Resp: 10 20  ?Temp:  36.4 ?C  ?SpO2: 93% 100%  ?  ?Last Pain:  ?Vitals:  ? 02/25/22 1625  ?TempSrc:   ?PainSc: 3   ? ? ?  ?  ?  ?  ?  ?  ? ?Carlos David ? ? ? ? ?

## 2022-02-26 ENCOUNTER — Other Ambulatory Visit: Payer: Medicare HMO

## 2022-02-26 ENCOUNTER — Encounter (HOSPITAL_BASED_OUTPATIENT_CLINIC_OR_DEPARTMENT_OTHER): Payer: Self-pay | Admitting: General Surgery

## 2022-02-28 ENCOUNTER — Ambulatory Visit (INDEPENDENT_AMBULATORY_CARE_PROVIDER_SITE_OTHER): Payer: Medicare HMO

## 2022-02-28 DIAGNOSIS — Z0000 Encounter for general adult medical examination without abnormal findings: Secondary | ICD-10-CM | POA: Diagnosis not present

## 2022-02-28 DIAGNOSIS — M722 Plantar fascial fibromatosis: Secondary | ICD-10-CM | POA: Diagnosis not present

## 2022-02-28 NOTE — Progress Notes (Signed)
Virtual Visit via Telephone Note ? ?I connected with  Carlos David on 02/28/22 at  1:00 PM EDT by telephone and verified that I am speaking with the correct person using two identifiers. ? ?Medicare Annual Wellness visit completed telephonically due to Covid-19 pandemic.  ? ?Persons participating in this call: This Health Coach and this patient.  ? ?Location: ?Patient: Home ?Provider: Office  ?  ?I discussed the limitations, risks, security and privacy concerns of performing an evaluation and management service by telephone and the availability of in person appointments. The patient expressed understanding and agreed to proceed. ? ?Unable to perform video visit due to video visit attempted and failed and/or patient does not have video capability.  ? ?Some vital signs may be absent or patient reported.  ? ?Willette Brace, LPN ? ? ?Subjective:  ? Carlos David is a 76 y.o. male who presents for Medicare Annual/Subsequent preventive examination. ? ?Review of Systems    ? ?Cardiac Risk Factors include: advanced age (>68mn, >>73women);dyslipidemia;male gender;hypertension ? ?   ?Objective:  ?  ?There were no vitals filed for this visit. ?There is no height or weight on file to calculate BMI. ? ? ?  02/28/2022  ?  1:11 PM 02/25/2022  ? 10:21 AM 02/22/2021  ? 11:59 AM 07/06/2019  ?  9:17 AM 09/06/2015  ?  3:17 PM 02/28/2014  ? 10:51 AM  ?Advanced Directives  ?Does Patient Have a Medical Advance Directive? Yes Yes Yes Yes Yes Patient does not have advance directive  ?Type of Advance Directive Living will [place] will Living will [place] will [place] will   ?Does patient want to make changes to medical advance directive?    No - Patient declined    ?Copy of [place]? No - copy requested No - copy requested  No - copy requested    ? ? ?Current Medications (verified) ?Outpatient Encounter Medications as of 02/28/2022   ?Medication Sig  ? aspirin EC 81 MG tablet Take 1 tablet (81 mg total) by mouth daily. Swallow whole.  ? atorvastatin (LIPITOR) 40 MG tablet TAKE 1 TABLET BY MOUTH EVERY DAY  ? finasteride (PROSCAR) 5 MG tablet TAKE 1 TABLET BY MOUTH EVERY DAY  ? gabapentin (NEURONTIN) 300 MG capsule Take 1 capsule by mouth 2 (two) times daily.  ? hydrocortisone 2.5 % cream   ? levothyroxine (SYNTHROID) 137 MCG tablet TAKE 1 TABLET BY MOUTH EVERY DAY  ? losartan (COZAAR) 100 MG tablet TAKE 1 TABLET BY MOUTH EVERY DAY  ? meloxicam (MOBIC) 15 MG tablet Take 1 tablet (15 mg total) by mouth as needed.  ? sildenafil (REVATIO) 20 MG tablet TAKE 1 TABLET (20 MG TOTAL) BY MOUTH 3 (THREE) TIMES DAILY AS NEEDED.  ? [DISCONTINUED] oxyCODONE (OXY IR/ROXICODONE) 5 MG immediate release tablet Take 1 tablet (5 mg total) by mouth every 6 (six) hours as needed for severe pain.  ? ?No facility-administered encounter medications on file as of 02/28/2022.  ? ? ?Allergies (verified) ?Penicillins  ? ?History: ?Past Medical History:  ?Diagnosis Date  ? Actinic keratosis 12/16/2010  ? Qualifier: Diagnosis of  By: KBurnice Logan MD, PDoretha Sou  ? BPH (benign prostatic hyperplasia)   ? Cancer ([facility]   ? thyroid cancer 1990  ? Cataract   ? both eyes  ? History of radiation therapy 1990  ? HYPERLIPIDEMIA 12/26/2008  ? Hypertension   ? 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 Thoracic aortic aneurysm ([place])   ? THYROID CANCER, HX OF 1990  ? THYROID CANCER, HX OF 12/26/2008  ? Qualifier: Diagnosis of  By: [name]  MD, [name]   ? ?Past Surgical History:  ?Procedure Laterality Date  ? CATARACT EXTRACTION, BILATERAL  2020  ? COLONOSCOPY    ? 10-ye ago-[facility]  ? EYE SURGERY    ? as child  ? HAND SURGERY    ? right hand trigger figger   ? INGUINAL HERNIA REPAIR Right 02/25/2022  ? Procedure: OPEN RIGHT INGUINAL HERNIA REPAIR WITH MESH;  Surgeon: Kinsinger, Arta Bruce, MD;  Location: [place];  Service: General;  Laterality: Right;  ?  neckotomy  1990  ? 20 lymph nodes removed from left neck  ? TOTAL THYROIDECTOMY  1990  ? TRANSURETHRAL RESECTION OF PROSTATE N/A 01/21/2019  ? Procedure: TRANSURETHRAL RESECTION OF THE PROSTATE (TURP);  Surgeon: [name], MD;  Location: [facility];  Service: Urology;  Laterality: N/A;  75 MINS  ? ?Family History  ?Problem Relation Age of Onset  ? Colon cancer Neg Hx   ? ?Social History  ? ?Socioeconomic History  ? Marital status: Married  ?  Spouse name: Not on file  ? Number of children: Not on file  ? Years of education: Not on file  ? Highest education level: Not on file  ?Occupational History  ? Not on file  ?Tobacco Use  ? Smoking status: Former  ?  Types: Cigarettes, Pipe  ?  Quit date: 11/17/1968  ?  Years since quitting: 53.3  ? Smokeless tobacco: Never  ?Vaping Use  ? Vaping Use: Never used  ?Substance and Sexual Activity  ? Alcohol use: Yes  ?  Alcohol/week: 0.0 standard drinks  ?  Comment: occas. beer  ? Drug use: No  ? Sexual activity: Not on file  ?Other Topics Concern  ? Not on file  ?Social History Narrative  ? Not on file  ? ?Social Determinants of Health  ? ?Financial Resource Strain: Low Risk   ? Difficulty of Paying Living Expenses: Not hard at all  ?Food Insecurity: No Food Insecurity  ? Worried About Charity fundraiser in the Last Year: Never true  ? Ran Out of Food in the Last Year: Never true  ?Transportation Needs: No Transportation Needs  ? Lack of Transportation (Medical): No  ? Lack of Transportation (Non-Medical): No  ?Physical Activity: Inactive  ? Days of Exercise per Week: 0 days  ? Minutes of Exercise per Session: 0 min  ?Stress: No Stress Concern Present  ? Feeling of Stress : Not at all  ?Social Connections: Moderately Integrated  ? Frequency of Communication with Friends and Family: Twice a week  ? Frequency of Social Gatherings with Friends and Family: More than three times a week  ? Attends Religious Services: More than 4 times per year  ? Active Member of  Clubs or Organizations: No  ? Attends Archivist Meetings: Never  ? Marital Status: Married  ? ? ?Tobacco Counseling ?Counseling given: Not Answered ? ? ?Clinical Intake: ? ?Pre-visit preparation completed: Yes ? ?Pain : No/denies pain ? ?  ? ?BMI - recorded: 28.44 ?Nutritional Status: BMI 25 -29 Overweight ?Nutritional Risks: None ?Diabetes: No ? ?How often do you need to have someone help you when you read instructions, pamphlets, or other written materials from your doctor or pharmacy?: 1 - Never ? ?Diabetic?no ? ?Interpreter Needed?:  No ? ?Information entered by :: Charlott Rakes, LPN ? ? ?Activities of Daily Living ? ?  02/28/2022  ?  1:12 PM 02/25/2022  ? 10:26 AM  ?In your present state of health, do you have any difficulty performing the following activities:  ?Hearing? 0 0  ?Vision? 0 0  ?Difficulty concentrating or making decisions? 0 0  ?Walking or climbing stairs? 0 0  ?Dressing or bathing? 0 0  ?Doing errands, shopping? 0   ?Preparing Food and eating ? N   ?Using the Toilet? N   ?In the past six months, have you accidently leaked urine? N   ?Do you have problems with loss of bowel control? N   ?Managing your Medications? N   ?Managing your Finances? N   ?Housekeeping or managing your Housekeeping? N   ? ? ?Patient Care Team: ?Vivi Barrack, MD as PCP - General (Family Medicine) ?[name], MD as Consulting Physician (Urology) ?Marlaine Hind, MD as Consulting Physician (Physical Medicine and Rehabilitation) ?Rutherford Guys, MD as Consulting Physician (Ophthalmology) ?Consuella Lose, MD as Consulting Physician (Neurosurgery) ?Edythe Clarity, [facility] as Pharmacist (Pharmacist) ? ?Indicate any recent Medical Services you may have received from other than [facility] providers in the past year (date may be approximate). ? ?   ?Assessment:  ? This is a routine wellness examination for Carlos David. ? ?Hearing/Vision screen ?Hearing Screening - Comments:: Pt denies any hearing issues  ?Vision Screening -  Comments:: Pt follows up with Dr Gershon Crane for annual eye exams  ? ?Dietary issues and exercise activities discussed: ?Current Exercise Habits: The patient does not participate in regular exercise at pres

## 2022-02-28 NOTE — Patient Instructions (Signed)
[name] , ?Thank you for taking time to come for your Medicare Wellness Visit. I appreciate your ongoing commitment to your health goals. Please review the following plan we discussed and let me know if I can assist you in the future.  ? ?Screening recommendations/referrals: ?Colonoscopy: Done 10/02/15 repeat every 5 years  ?Recommended yearly ophthalmology/optometry visit for glaucoma screening and checkup ?Recommended yearly dental visit for hygiene and checkup ? ?Vaccinations: ?Influenza vaccine: Declined  ?Pneumococcal vaccine: Up to date ?Tdap vaccine: 09/20/14 repeat every 10 years  ?Shingles vaccine: Shingrix discussed. Please contact your pharmacy for coverage information.    ?Covid-19: Completed 2/7, 3/4, & 09/30/20 ? ?Advanced directives: Please bring a copy of your health care power of attorney and living will to the office at your convenience. ? ?Conditions/risks identified: Lose a little weight  ? ?Next appointment: Follow up in one year for your annual wellness visit.  ? ?Preventive Care 56 Years and Older, Male ?Preventive care refers to lifestyle choices and visits with your health care provider that can promote health and wellness. ?What does preventive care include? ?A yearly physical exam. This is also called an annual well check. ?Dental exams once or twice a year. ?Routine eye exams. Ask your health care provider how often you should have your eyes checked. ?Personal lifestyle choices, including: ?Daily care of your teeth and gums. ?Regular physical activity. ?Eating a healthy diet. ?Avoiding tobacco and drug use. ?Limiting alcohol use. ?Practicing safe sex. ?Taking low doses of aspirin every day. ?Taking vitamin and mineral supplements as recommended by your health care provider. ?What happens during an annual well check? ?The services and screenings done by your health care provider during your annual well check will depend on your age, overall health, lifestyle risk factors, and family  history of disease. ?Counseling  ?Your health care provider may ask you questions about your: ?Alcohol use. ?Tobacco use. ?Drug use. ?Emotional well-being. ?Home and relationship well-being. ?Sexual activity. ?Eating habits. ?History of falls. ?Memory and ability to understand (cognition). ?Work and work Statistician. ?Screening  ?You may have the following tests or measurements: ?Height, weight, and BMI. ?Blood pressure. ?Lipid and cholesterol levels. These may be checked every 5 years, or more frequently if you are over 49 years old. ?Skin check. ?Lung cancer screening. You may have this screening every year starting at age 52 if you have a 30-pack-year history of smoking and currently smoke or have quit within the past 15 years. ?Fecal occult blood test (FOBT) of the stool. You may have this test every year starting at age 75. ?Flexible sigmoidoscopy or colonoscopy. You may have a sigmoidoscopy every 5 years or a colonoscopy every 10 years starting at age 46. ?Prostate cancer screening. Recommendations will vary depending on your family history and other risks. ?Hepatitis C blood test. ?Hepatitis B blood test. ?Sexually transmitted disease (STD) testing. ?Diabetes screening. This is done by checking your blood sugar (glucose) after you have not eaten for a while (fasting). You may have this done every 1-3 years. ?Abdominal aortic aneurysm (AAA) screening. You may need this if you are a current or former smoker. ?Osteoporosis. You may be screened starting at age 110 if you are at high risk. ?Talk with your health care provider about your test results, treatment options, and if necessary, the need for more tests. ?Vaccines  ?Your health care provider may recommend certain vaccines, such as: ?Influenza vaccine. This is recommended every year. ?Tetanus, diphtheria, and acellular pertussis (Tdap, Td) vaccine. You may need  a Td booster every 10 years. ?Zoster vaccine. You may need this after age 73. ?Pneumococcal  13-valent conjugate (PCV13) vaccine. One dose is recommended after age 10. ?Pneumococcal polysaccharide (PPSV23) vaccine. One dose is recommended after age 37. ?Talk to your health care provider about which screenings and vaccines you need and how often you need them. ?This information is not intended to replace advice given to you by your health care provider. Make sure you discuss any questions you have with your health care provider. ?Document Released: 11/30/2015 Document Revised: 07/23/2016 Document Reviewed: 09/04/2015 ?Elsevier Interactive Patient Education ? 2017 [place]. ? ?Fall Prevention in the Home ?Falls can cause injuries. They can happen to people of all ages. There are many things you can do to make your home safe and to help prevent falls. ?What can I do on the outside of my home? ?Regularly fix the edges of walkways and driveways and fix any cracks. ?Remove anything that might make you trip as you walk through a door, such as a raised step or threshold. ?Trim any bushes or trees on the path to your home. ?Use bright outdoor lighting. ?Clear any walking paths of anything that might make someone trip, such as rocks or tools. ?Regularly check to see if handrails are loose or broken. Make sure that both sides of any steps have handrails. ?Any raised decks and porches should have guardrails on the edges. ?Have any leaves, snow, or ice cleared regularly. ?Use sand or salt on walking paths during winter. ?Clean up any spills in your garage right away. This includes oil or grease spills. ?What can I do in the bathroom? ?Use night lights. ?Install grab bars by the toilet and in the tub and shower. Do not use towel bars as grab bars. ?Use non-skid mats or decals in the tub or shower. ?If you need to sit down in the shower, use a plastic, non-slip stool. ?Keep the floor dry. Clean up any water that spills on the floor as soon as it happens. ?Remove soap buildup in the tub or shower regularly. ?Attach  bath mats securely with double-sided non-slip rug tape. ?Do not have throw rugs and other things on the floor that can make you trip. ?What can I do in the bedroom? ?Use night lights. ?Make sure that you have a light by your bed that is easy to reach. ?Do not use any sheets or blankets that are too big for your bed. They should not hang down onto the floor. ?Have a firm chair that has side arms. You can use this for support while you get dressed. ?Do not have throw rugs and other things on the floor that can make you trip. ?What can I do in the kitchen? ?Clean up any spills right away. ?Avoid walking on wet floors. ?Keep items that you use a lot in easy-to-reach places. ?If you need to reach something above you, use a strong step stool that has a grab bar. ?Keep electrical cords out of the way. ?Do not use floor polish or wax that makes floors slippery. If you must use wax, use non-skid floor wax. ?Do not have throw rugs and other things on the floor that can make you trip. ?What can I do with my stairs? ?Do not leave any items on the stairs. ?Make sure that there are handrails on both sides of the stairs and use them. Fix handrails that are broken or loose. Make sure that handrails are as long as the stairways. ?Check  any carpeting to make sure that it is firmly attached to the stairs. Fix any carpet that is loose or worn. ?Avoid having throw rugs at the top or bottom of the stairs. If you do have throw rugs, attach them to the floor with carpet tape. ?Make sure that you have a light switch at the top of the stairs and the bottom of the stairs. If you do not have them, ask someone to add them for you. ?What else can I do to help prevent falls? ?Wear shoes that: ?Do not have high heels. ?Have rubber bottoms. ?Are comfortable and fit you well. ?Are closed at the toe. Do not wear sandals. ?If you use a stepladder: ?Make sure that it is fully opened. Do not climb a closed stepladder. ?Make sure that both sides of the  stepladder are locked into place. ?Ask someone to hold it for you, if possible. ?Clearly mark and make sure that you can see: ?Any grab bars or handrails. ?First and last steps. ?Where the edge of each step is. ?Use

## 2022-03-05 ENCOUNTER — Ambulatory Visit: Payer: Medicare HMO | Admitting: Podiatry

## 2022-03-06 ENCOUNTER — Other Ambulatory Visit: Payer: Self-pay | Admitting: Cardiology

## 2022-03-12 ENCOUNTER — Ambulatory Visit (INDEPENDENT_AMBULATORY_CARE_PROVIDER_SITE_OTHER)
Admission: RE | Admit: 2022-03-12 | Discharge: 2022-03-12 | Disposition: A | Discharge status: Home / Self Care | Payer: Medicare HMO | Source: Ambulatory Visit | Attending: Cardiology | Admitting: Cardiology

## 2022-03-12 DIAGNOSIS — I7121 Aneurysm of the ascending aorta, without rupture: Secondary | ICD-10-CM | POA: Diagnosis not present

## 2022-03-12 DIAGNOSIS — R911 Solitary pulmonary nodule: Secondary | ICD-10-CM

## 2022-03-12 DIAGNOSIS — I712 Thoracic aortic aneurysm, without rupture, unspecified: Secondary | ICD-10-CM

## 2022-03-12 MED ORDER — IOHEXOL 350 MG/ML SOLN
100.0000 mL | Freq: Once | INTRAVENOUS | Status: AC | PRN
  Administered 2022-03-12: 100 mL via INTRAVENOUS

## 2022-03-24 ENCOUNTER — Encounter: Payer: Self-pay | Admitting: Family Medicine

## 2022-03-25 NOTE — Telephone Encounter (Signed)
There are no specific vitamin brands I would recommend though he should make sure that he is getting at least '1200mg'$  of calcium and 800IU of vitamin D daily for his bone health. ? ?Algis Greenhouse. Jerline Pain, MD ?03/25/2022 1:07 PM  ? ?

## 2022-03-25 NOTE — Telephone Encounter (Signed)
Please see note and adivse ?

## 2022-03-25 NOTE — Telephone Encounter (Signed)
See note and advise °

## 2022-04-11 ENCOUNTER — Telehealth: Payer: Self-pay | Admitting: *Deleted

## 2022-04-11 NOTE — Telephone Encounter (Signed)
BP log received.  Dr. Gardiner Rhyme reviewed and no changes recommended.

## 2022-04-16 ENCOUNTER — Ambulatory Visit (HOSPITAL_COMMUNITY)
Admission: RE | Admit: 2022-04-16 | Discharge: 2022-04-16 | Disposition: A | Discharge status: Home / Self Care | Payer: Medicare HMO | Source: Ambulatory Visit | Attending: Cardiology | Admitting: Cardiology

## 2022-04-16 DIAGNOSIS — R200 Anesthesia of skin: Secondary | ICD-10-CM | POA: Diagnosis not present

## 2022-04-28 DIAGNOSIS — R3914 Feeling of incomplete bladder emptying: Secondary | ICD-10-CM | POA: Diagnosis not present

## 2022-04-28 DIAGNOSIS — N5201 Erectile dysfunction due to arterial insufficiency: Secondary | ICD-10-CM | POA: Diagnosis not present

## 2022-05-02 ENCOUNTER — Ambulatory Visit: Payer: Medicare HMO | Admitting: Podiatry

## 2022-05-07 ENCOUNTER — Encounter: Payer: Self-pay | Admitting: *Deleted

## 2022-06-06 DIAGNOSIS — M25562 Pain in left knee: Secondary | ICD-10-CM | POA: Diagnosis not present

## 2022-06-11 DIAGNOSIS — L814 Other melanin hyperpigmentation: Secondary | ICD-10-CM | POA: Diagnosis not present

## 2022-06-11 DIAGNOSIS — L820 Inflamed seborrheic keratosis: Secondary | ICD-10-CM | POA: Diagnosis not present

## 2022-06-11 DIAGNOSIS — L570 Actinic keratosis: Secondary | ICD-10-CM | POA: Diagnosis not present

## 2022-06-11 DIAGNOSIS — L538 Other specified erythematous conditions: Secondary | ICD-10-CM | POA: Diagnosis not present

## 2022-06-11 DIAGNOSIS — Z08 Encounter for follow-up examination after completed treatment for malignant neoplasm: Secondary | ICD-10-CM | POA: Diagnosis not present

## 2022-06-11 DIAGNOSIS — D225 Melanocytic nevi of trunk: Secondary | ICD-10-CM | POA: Diagnosis not present

## 2022-06-11 DIAGNOSIS — L821 Other seborrheic keratosis: Secondary | ICD-10-CM | POA: Diagnosis not present

## 2022-06-11 DIAGNOSIS — Z86006 Personal history of melanoma in-situ: Secondary | ICD-10-CM | POA: Diagnosis not present

## 2022-06-14 DIAGNOSIS — M25562 Pain in left knee: Secondary | ICD-10-CM | POA: Diagnosis not present

## 2022-06-18 DIAGNOSIS — M25562 Pain in left knee: Secondary | ICD-10-CM | POA: Diagnosis not present

## 2022-06-19 ENCOUNTER — Telehealth: Payer: Self-pay | Admitting: *Deleted

## 2022-06-19 ENCOUNTER — Telehealth: Payer: Self-pay

## 2022-06-19 DIAGNOSIS — M5416 Radiculopathy, lumbar region: Secondary | ICD-10-CM | POA: Diagnosis not present

## 2022-06-19 NOTE — Telephone Encounter (Signed)
   Pre-operative Risk Assessment    Patient Name: Carlos David  DOB: Dec 16, 1945 MRN: [id number]      Request for Surgical Clearance    Procedure:   Left Knee scope   Date of Surgery:  Clearance TBD                                 Surgeon:  Edmonia Lynch, MD Surgeon's Group or Practice Name:  [facility] Phone number:  [phone] Fax number:  [phone]   Type of Clearance Requested:   - Medical    Type of Anesthesia:   Choice    Additional requests/questions:    SignedJacqulynn Cadet   06/19/2022, 4:14 PM

## 2022-06-19 NOTE — Telephone Encounter (Signed)
   Name: Carlos David  DOB: Feb 27, 1946  MRN: [id number]  Primary Cardiologist: None  Chart reviewed as part of pre-operative protocol coverage. Because of [name]'s past medical history and time since last visit, he will require a follow-up tele visit in order to better assess preoperative cardiovascular risk.  Pre-op covering staff: - Please schedule appointment and call patient to inform them. If patient already had an upcoming appointment within acceptable timeframe, please add "pre-op clearance" to the appointment notes so provider is aware. - Please contact requesting surgeon's office via preferred method (i.e, phone, fax) to inform them of need for appointment prior to surgery.  No indicated medications needing held.   Elgie Collard, PA-C  06/19/2022, 4:23 PM

## 2022-06-19 NOTE — Telephone Encounter (Signed)
Pt agreeable to plan of care for tele pre op appt 06/25/22 @ 9:40. Med rec and consent are done.

## 2022-06-19 NOTE — Telephone Encounter (Signed)
Pt agreeable to plan of care for tele pre op appt 06/25/22 @ 9:40. Med rec and consent are done.      Patient Consent for Virtual Visit        ABDALLAH HERN has provided verbal consent on 06/19/2022 for a virtual visit (video or telephone).   CONSENT FOR VIRTUAL VISIT FOR:  Isabell Jarvis  By participating in this virtual visit I agree to the following:  I hereby voluntarily request, consent and authorize [facility] and its employed or contracted physicians, physician assistants, nurse practitioners or other licensed health care professionals (the Practitioner), to provide me with telemedicine health care services (the "Services") as deemed necessary by the treating Practitioner. I acknowledge and consent to receive the Services by the Practitioner via telemedicine. I understand that the telemedicine visit will involve communicating with the Practitioner through live audiovisual communication technology and the disclosure of certain medical information by electronic transmission. I acknowledge that I have been given the opportunity to request an in-person assessment or other available alternative prior to the telemedicine visit and am voluntarily participating in the telemedicine visit.  I understand that I have the right to withhold or withdraw my consent to the use of telemedicine in the course of my care at any time, without affecting my right to future care or treatment, and that the Practitioner or I may terminate the telemedicine visit at any time. I understand that I have the right to inspect all information obtained and/or recorded in the course of the telemedicine visit and may receive copies of available information for a reasonable fee.  I understand that some of the potential risks of receiving the Services via telemedicine include:  Delay or interruption in medical evaluation due to technological equipment failure or disruption; Information transmitted may not be sufficient (e.g.  poor resolution of images) to allow for appropriate medical decision making by the Practitioner; and/or  In rare instances, security protocols could fail, causing a breach of personal health information.  Furthermore, I acknowledge that it is my responsibility to provide information about my medical history, conditions and care that is complete and accurate to the best of my ability. I acknowledge that Practitioner's advice, recommendations, and/or decision may be based on factors not within their control, such as incomplete or inaccurate data provided by me or distortions of diagnostic images or specimens that may result from electronic transmissions. I understand that the practice of medicine is not an exact science and that Practitioner makes no warranties or guarantees regarding treatment outcomes. I acknowledge that a copy of this consent can be made available to me via my patient portal ([facility]), or I can request a printed copy by calling the office of [facility].    I understand that my insurance will be billed for this visit.   I have read or had this consent read to me. I understand the contents of this consent, which adequately explains the benefits and risks of the Services being provided via telemedicine.  I have been provided ample opportunity to ask questions regarding this consent and the Services and have had my questions answered to my satisfaction. I give my informed consent for the services to be provided through the use of telemedicine in my medical care

## 2022-06-20 DIAGNOSIS — M5416 Radiculopathy, lumbar region: Secondary | ICD-10-CM | POA: Diagnosis not present

## 2022-06-25 ENCOUNTER — Encounter: Payer: Self-pay | Admitting: Nurse Practitioner

## 2022-06-25 ENCOUNTER — Ambulatory Visit (INDEPENDENT_AMBULATORY_CARE_PROVIDER_SITE_OTHER): Payer: Medicare HMO | Admitting: Nurse Practitioner

## 2022-06-25 DIAGNOSIS — Z01810 Encounter for preprocedural cardiovascular examination: Secondary | ICD-10-CM

## 2022-06-25 NOTE — Progress Notes (Signed)
Virtual Visit via Telephone Note   Because of Carlos David's co-morbid illnesses, he is at least at moderate risk for complications without adequate follow up.  This format is felt to be most appropriate for this patient at this time.  The patient did not have access to video technology/had technical difficulties with video requiring transitioning to audio format only (telephone).  All issues noted in this document were discussed and addressed.  No physical exam could be performed with this format.  Please refer to the patient's chart for his consent to telehealth for [facility].  Evaluation Performed:  Preoperative cardiovascular risk assessment _____________   Date:  06/25/2022   Patient ID:  Carlos David, DOB Feb 27, 1946, MRN [id number] Patient Location:  Home Provider location:   Office  Primary Care Provider:  Vivi Barrack, MD Primary Cardiologist:  Donato Heinz, MD  Chief Complaint / Patient Profile   76 y.o. y/o male with a h/o HTN, hyperlipidemia, thyroid cancer now hypothyroid, elevated coronary calcium, nonobstructive CAD,ascending aorta dilatation, who is pending left knee scope and presents today for telephonic preoperative cardiovascular risk assessment.  Past Medical History    Past Medical History:  Diagnosis Date   Actinic keratosis 12/16/2010   Qualifier: Diagnosis of  By: [name]  MD, [name]    BPH (benign prostatic hyperplasia)    Cancer [facility])    thyroid cancer 1990   Cataract    both eyes   History of radiation therapy 1990   HYPERLIPIDEMIA 12/26/2008   Hypertension    HYPOTHYROIDISM 12/26/2008   PPD positive    Thoracic aortic aneurysm ([place])    THYROID CANCER, HX OF 1990   THYROID CANCER, HX OF 12/26/2008   Qualifier: Diagnosis of  By: [name]  MD, [name]    Past Surgical History:  Procedure Laterality Date   CATARACT EXTRACTION, BILATERAL  2020   COLONOSCOPY     10-ye ago-normal at [facility]   [place]     as child    HAND SURGERY     right hand trigger figger    INGUINAL HERNIA REPAIR Right 02/25/2022   Procedure: OPEN RIGHT INGUINAL HERNIA REPAIR WITH MESH;  Surgeon: Kinsinger, Arta Bruce, MD;  Location: [facility];  Service: General;  Laterality: Right;   neckotomy  1990   20 lymph nodes removed from left neck   TOTAL THYROIDECTOMY  1990   TRANSURETHRAL RESECTION OF PROSTATE N/A 01/21/2019   Procedure: TRANSURETHRAL RESECTION OF THE PROSTATE (TURP);  Surgeon: [name], MD;  Location: [facility];  Service: Urology;  Laterality: N/A;  75 MINS    Allergies  Allergies  Allergen Reactions   Penicillins Hives    History of Present Illness    Carlos David is a 76 y.o. male who presents via audio/video conferencing for a telehealth visit today.  Pt was last seen in cardiology clinic on 02/06/22 by Dr. Gardiner Rhyme.  At that time REYMOND MAYNEZ was having some high BP readings. He submitted a home BP log a few weeks later and no changes were advised by Dr. Gardiner Rhyme. The patient is now pending procedure as outlined above. Since his last visit, he  denies chest pain, shortness of breath, lower extremity edema, fatigue, palpitations, melena, hematuria, hemoptysis, diaphoresis, weakness, presyncope, syncope, orthopnea, and PND.   Home Medications    Prior to Admission medications   Medication Sig Start Date End Date Taking? Authorizing Provider  aspirin EC 81 MG tablet  Take 1 tablet (81 mg total) by mouth daily. Swallow whole. 07/30/21   Donato Heinz, MD  atorvastatin (LIPITOR) 40 MG tablet TAKE 1 TABLET BY MOUTH EVERY DAY 03/06/22   Donato Heinz, MD  finasteride (PROSCAR) 5 MG tablet TAKE 1 TABLET BY MOUTH EVERY DAY 10/15/16   Marletta Lor, MD  gabapentin (NEURONTIN) 300 MG capsule Take 1 capsule by mouth 2 (two) times daily. 05/21/21   [provider]  hydrocortisone 2.5 % cream  06/05/20   [provider]  levothyroxine  (SYNTHROID) 137 MCG tablet TAKE 1 TABLET BY MOUTH EVERY DAY 01/20/22   Vivi Barrack, MD  losartan (COZAAR) 100 MG tablet TAKE 1 TABLET BY MOUTH EVERY DAY 10/14/21   Donato Heinz, MD  meloxicam (MOBIC) 15 MG tablet Take 1 tablet (15 mg total) by mouth as needed. 07/30/21   Donato Heinz, MD  sildenafil (REVATIO) 20 MG tablet TAKE 1 TABLET (20 MG TOTAL) BY MOUTH 3 (THREE) TIMES DAILY AS NEEDED. 04/11/21   Vivi Barrack, MD    Physical Exam    Vital Signs:  Isabell Jarvis does not have vital signs available for review today.  Given telephonic nature of communication, physical exam is limited. AAOx3. NAD. Normal affect.  Speech and respirations are unlabored.  Accessory Clinical Findings    None  Assessment & Plan    1.  Preoperative Cardiovascular Risk Assessment: The patient is doing well from a cardiac perspective. Therefore, based on ACC/AHA guidelines, the patient would be at acceptable risk for the planned procedure without further cardiovascular testing. The patient was advised that if he develops new symptoms prior to surgery to contact our office to arrange for a follow-up visit, and he verbalized understanding. According to the Revised Cardiac Risk Index (RCRI), his Perioperative Risk of Major Cardiac Event is (%): 0.4. His Functional Capacity in METs is: 7.59 according to the Duke Activity Status Index (DASI). There is no formal request to hold aspirin, however it can be held if needed for 5-7 days prior to procedure and should be resumed postoperatively as soon as deemed safe by the surgeon.   A copy of this note will be routed to requesting surgeon.  Time:   Today, I have spent 8 minutes with the patient with telehealth technology discussing medical history, symptoms, and management plan.     Emmaline Life, NP-C    06/25/2022, 9:45 AM [place] [address] Office [phone] Fax [phone]

## 2022-07-08 ENCOUNTER — Other Ambulatory Visit: Payer: Self-pay | Admitting: Family Medicine

## 2022-07-09 ENCOUNTER — Other Ambulatory Visit: Payer: Self-pay | Admitting: Cardiology

## 2022-07-11 ENCOUNTER — Encounter: Payer: Medicare HMO | Admitting: Family Medicine

## 2022-07-14 DIAGNOSIS — L538 Other specified erythematous conditions: Secondary | ICD-10-CM | POA: Diagnosis not present

## 2022-07-14 DIAGNOSIS — L821 Other seborrheic keratosis: Secondary | ICD-10-CM | POA: Diagnosis not present

## 2022-07-14 DIAGNOSIS — D485 Neoplasm of uncertain behavior of skin: Secondary | ICD-10-CM | POA: Diagnosis not present

## 2022-07-14 DIAGNOSIS — D692 Other nonthrombocytopenic purpura: Secondary | ICD-10-CM | POA: Diagnosis not present

## 2022-07-14 DIAGNOSIS — L820 Inflamed seborrheic keratosis: Secondary | ICD-10-CM | POA: Diagnosis not present

## 2022-07-16 ENCOUNTER — Encounter: Payer: Self-pay | Admitting: Family Medicine

## 2022-07-16 ENCOUNTER — Ambulatory Visit (INDEPENDENT_AMBULATORY_CARE_PROVIDER_SITE_OTHER): Payer: Medicare HMO | Admitting: Family Medicine

## 2022-07-16 VITALS — BP 135/87 | HR 70 | Temp 97.7°F | Ht 70.0 in | Wt 191.6 lb

## 2022-07-16 DIAGNOSIS — E785 Hyperlipidemia, unspecified: Secondary | ICD-10-CM | POA: Diagnosis not present

## 2022-07-16 DIAGNOSIS — E039 Hypothyroidism, unspecified: Secondary | ICD-10-CM

## 2022-07-16 DIAGNOSIS — Z0001 Encounter for general adult medical examination with abnormal findings: Secondary | ICD-10-CM | POA: Diagnosis not present

## 2022-07-16 DIAGNOSIS — M150 Primary generalized (osteo)arthritis: Secondary | ICD-10-CM

## 2022-07-16 DIAGNOSIS — M159 Polyosteoarthritis, unspecified: Secondary | ICD-10-CM

## 2022-07-16 DIAGNOSIS — N400 Enlarged prostate without lower urinary tract symptoms: Secondary | ICD-10-CM

## 2022-07-16 DIAGNOSIS — R739 Hyperglycemia, unspecified: Secondary | ICD-10-CM | POA: Diagnosis not present

## 2022-07-16 DIAGNOSIS — I10 Essential (primary) hypertension: Secondary | ICD-10-CM | POA: Diagnosis not present

## 2022-07-16 LAB — COMPREHENSIVE METABOLIC PANEL
ALT: 21 U/L (ref 0–53)
AST: 24 U/L (ref 0–37)
Albumin: 4.2 g/dL (ref 3.5–5.2)
Alkaline Phosphatase: 57 U/L (ref 39–117)
BUN: 23 mg/dL (ref 6–23)
CO2: 26 mEq/L (ref 19–32)
Calcium: 9.3 mg/dL (ref 8.4–10.5)
Chloride: 103 mEq/L (ref 96–112)
Creatinine, Ser: 1.02 mg/dL (ref 0.40–1.50)
GFR: 70.94 mL/min (ref >60.00)
Glucose, Bld: 85 mg/dL (ref 70–99)
Potassium: 4 mEq/L (ref 3.5–5.1)
Sodium: 138 mEq/L (ref 135–145)
Total Bilirubin: 0.6 mg/dL (ref 0.2–1.2)
Total Protein: 6.9 g/dL (ref 6.0–8.3)

## 2022-07-16 LAB — CBC
HCT: 43.7 % (ref 39.0–52.0)
Hemoglobin: 14.6 g/dL (ref 13.0–17.0)
MCHC: 33.4 g/dL (ref 30.0–36.0)
MCV: 92 fl (ref 78.0–100.0)
Platelets: 168 10*3/uL (ref 150.0–400.0)
RBC: 4.75 Mil/uL (ref 4.22–5.81)
RDW: 13.4 % (ref 11.5–15.5)
WBC: 6.2 10*3/uL (ref 4.0–10.5)

## 2022-07-16 LAB — LIPID PANEL
Cholesterol: 178 mg/dL (ref 0–200)
HDL: 73.4 mg/dL (ref >39.00)
LDL Cholesterol: 87 mg/dL (ref 0–99)
NonHDL: 104.19
Total CHOL/HDL Ratio: 2
Triglycerides: 87 mg/dL (ref 0.0–149.0)
VLDL: 17.4 mg/dL (ref 0.0–40.0)

## 2022-07-16 LAB — HEMOGLOBIN A1C: Hgb A1c MFr Bld: 6.2 % (ref 4.6–6.5)

## 2022-07-16 LAB — TSH: TSH: 1.37 u[IU]/mL (ref 0.35–5.50)

## 2022-07-16 NOTE — Assessment & Plan Note (Signed)
Check TSH. On synthroid 172mg daily.

## 2022-07-16 NOTE — Progress Notes (Signed)
Chief Complaint:  Carlos David is a 76 y.o. male who presents today for his annual comprehensive physical exam.    Assessment/Plan:  Chronic Problems Addressed Today: Dyslipidemia On lipitor '40mg'$  daily. Check labs.   Hypothyroidism s/p thyroidectomy 1990 due to thyroid cancer Check TSH. On synthroid 126mg daily.   BPH s/p TURP 2020 Follows with urology. On finasteride '5mg'$  daily.   Essential hypertension At goal today on losartan '100mg'$  daily.   Osteoarthritis Follows with orthopedics. On voltaren gel as needed and meloxicam '15mg'$  daily.   Preventative Healthcare: Check labs. Due for colonoscopy next year. UTD on vaccines.   Patient Counseling(The following topics were reviewed and/or handout was given):  -Nutrition: Stressed importance of moderation in sodium/caffeine intake, saturated fat and cholesterol, caloric balance, sufficient intake of fresh fruits, vegetables, and fiber.  -Stressed the importance of regular exercise.   -Substance Abuse: Discussed cessation/primary prevention of tobacco, alcohol, or other drug use; driving or other dangerous activities under the influence; availability of treatment for abuse.   -Injury prevention: Discussed safety belts, safety helmets, smoke detector, smoking near bedding or upholstery.   -Sexuality: Discussed sexually transmitted diseases, partner selection, use of condoms, avoidance of unintended pregnancy and contraceptive alternatives.   -Dental health: Discussed importance of regular tooth brushing, flossing, and dental visits.  -Health maintenance and immunizations reviewed. Please refer to Health maintenance section.  Return to care in 1 year for next preventative visit.     Subjective:  HPI:  He has no acute complaints today. See A/p for status of chronic conditions.   Lifestyle Diet: Balanced. Plenty of fruits and vegetables.  Exercise: Busy around the house.      07/16/2022    7:59 AM  Depression screen PHQ 2/9   Decreased Interest 0  Down, Depressed, Hopeless 0  PHQ - 2 Score 0    There are no preventive care reminders to display for this patient.   ROS: Per HPI, otherwise a complete review of systems was negative.   PMH:  The following were reviewed and entered/updated in epic: Past Medical History:  Diagnosis Date   Actinic keratosis 12/16/2010   Qualifier: Diagnosis of  By: KBurnice Logan MD, PDoretha Sou   BPH (benign prostatic hyperplasia)    Cancer ([facility]    thyroid cancer 1990   Cataract    both eyes   History of radiation therapy 1990   HYPERLIPIDEMIA 12/26/2008   Hypertension    HYPOTHYROIDISM 12/26/2008   PPD positive    [place]    THYROID CANCER, HX OF 1990   THYROID CANCER, HX OF 12/26/2008   Qualifier: Diagnosis of  By: KBurnice Logan MD, PDoretha Sou   Patient Active Problem List   Diagnosis Date Noted   Carpal tunnel syndrome 03/06/2020   Essential hypertension 12/17/2018   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 12/17/2018   Erectile dysfunction 12/17/2018   Neck pain 09/15/2018   Lumbar radiculopathy 10/14/2016   BPH s/p TURP 2020 03/02/2014   Osteoarthritis 07/16/2012   Hypothyroidism s/p thyroidectomy 1990 due to thyroid cancer 12/26/2008   Dyslipidemia 12/26/2008   Past Surgical History:  Procedure Laterality Date   CATARACT EXTRACTION, BILATERAL  2020   [facility]  EYE SURGERY     as child   HAND SURGERY     right hand trigger figger    INGUINAL HERNIA REPAIR Right 02/25/2022   Procedure: OPEN RIGHT  INGUINAL HERNIA REPAIR WITH MESH;  Surgeon: Kinsinger, Arta Bruce, MD;  Location: [facility];  Service: General;  Laterality: Right;   neckotomy  1990   20 lymph nodes removed from left neck   TOTAL THYROIDECTOMY  1990   TRANSURETHRAL RESECTION OF PROSTATE N/A 01/21/2019   Procedure: TRANSURETHRAL RESECTION OF THE PROSTATE (TURP);  Surgeon: [name], MD;   Location: [facility];  Service: Urology;  Laterality: N/A;  30 MINS    Family History  Problem Relation Age of Onset   Colon cancer Neg Hx     Medications- reviewed and updated Current Outpatient Medications  Medication Sig Dispense Refill   aspirin EC 81 MG tablet Take 1 tablet (81 mg total) by mouth daily. Swallow whole. 90 tablet 3   atorvastatin (LIPITOR) 40 MG tablet TAKE 1 TABLET BY MOUTH EVERY DAY 90 tablet 1   finasteride (PROSCAR) 5 MG tablet TAKE 1 TABLET BY MOUTH EVERY DAY 90 tablet 1   gabapentin (NEURONTIN) 300 MG capsule Take 1 capsule by mouth 2 (two) times daily.     hydrocortisone 2.5 % cream      levothyroxine (SYNTHROID) 137 MCG tablet TAKE 1 TABLET BY MOUTH EVERY DAY 90 tablet 1   losartan (COZAAR) 100 MG tablet TAKE 1 TABLET BY MOUTH EVERY DAY 90 tablet 3   meloxicam (MOBIC) 15 MG tablet Take 1 tablet (15 mg total) by mouth as needed. 30 tablet 0   No current facility-administered medications for this visit.    Allergies-reviewed and updated Allergies  Allergen Reactions   Penicillins Hives    Social History   Socioeconomic History   Marital status: Married    Spouse name: Not on file   Number of children: Not on file   Years of education: Not on file   Highest education level: Not on file  Occupational History   Not on file  Tobacco Use   Smoking status: Former    Types: Cigarettes, Pipe    Quit date: 11/17/1968    Years since quitting: 53.6   Smokeless tobacco: Never  Vaping Use   Vaping Use: Never used  Substance and Sexual Activity   Alcohol use: Yes    Alcohol/week: 0.0 standard drinks of alcohol    Comment: occas. beer   Drug use: No   Sexual activity: Not on file  Other Topics Concern   Not on file  Social History Narrative   Not on file   Social Determinants of Health   Financial Resource Strain: Low Risk  (02/28/2022)   Overall Financial Resource Strain (CARDIA)    Difficulty of Paying Living Expenses: Not hard  at all  Food Insecurity: No Food Insecurity (02/28/2022)   Hunger Vital Sign    Worried About Running Out of Food in the Last Year: Never true    Ran Out of Food in the Last Year: Never true  Transportation Needs: No Transportation Needs (02/28/2022)   PRAPARE - Hydrologist (Medical): No    Lack of Transportation (Non-Medical): No  Physical Activity: Inactive (02/28/2022)   Exercise Vital Sign    Days of Exercise per Week: 0 days    Minutes of Exercise per Session: 0 min  Stress: No Stress Concern Present (02/28/2022)   [place]    Feeling of Stress : Not at all  Social Connections: Moderately Integrated (02/28/2022)   Social Connection and Isolation Panel [NHANES]  Frequency of Communication with Friends and Family: Twice a week    Frequency of Social Gatherings with Friends and Family: More than three times a week    Attends Religious Services: More than 4 times per year    Active Member of [employer] or Organizations: No    Attends Music therapist: Never    Marital Status: Married        Objective:  Physical Exam: BP 135/87   Pulse 70   Temp 97.7 F (36.5 C)   Ht '5\' 10"'$  (1.778 m)   Wt 191 lb 9.6 oz (86.9 kg)   SpO2 95%   BMI 27.49 kg/m   Body mass index is 27.49 kg/m. Wt Readings from Last 3 Encounters:  07/16/22 191 lb 9.6 oz (86.9 kg)  02/25/22 198 lb 3.2 oz (89.9 kg)  02/17/22 199 lb 12.8 oz (90.6 kg)   Gen: NAD, resting comfortably HEENT: TMs normal bilaterally. OP clear. No thyromegaly noted.  CV: RRR with no murmurs appreciated Pulm: NWOB, CTAB with no crackles, wheezes, or rhonchi GI: Normal bowel sounds present. Soft, Nontender, Nondistended. MSK: no edema, cyanosis, or clubbing noted Skin: warm, dry Neuro: CN2-12 grossly intact. Strength 5/5 in upper and lower extremities. Reflexes symmetric and intact bilaterally.  Psych: Normal affect and thought  content     Elnita Surprenant M. Jerline Pain, MD 07/16/2022 8:32 AM

## 2022-07-16 NOTE — Assessment & Plan Note (Signed)
Follows with urology. On finasteride '5mg'$  daily.

## 2022-07-16 NOTE — Patient Instructions (Signed)
It was very nice to see you today!  We will check blood work today.   Please keep working on diet and exercise.   We will see you back in a year for your next physical. Come back sooner if needed.   Take care, Dr Jerline Pain  PLEASE NOTE:  If you had any lab tests please let us know if you have not heard back within a few days. You may see your results on mychart before we have a chance to review them but we will give you a call once they are reviewed by [place]. If we ordered any referrals today, please let us know if you have not heard from their office within the next week.   Please try these tips to maintain a healthy lifestyle:  Eat at least 3 REAL meals and 1-2 snacks per day.  Aim for no more than 5 hours between eating.  If you eat breakfast, please do so within one hour of getting up.   Each meal should contain half fruits/vegetables, one quarter protein, and one quarter carbs (no bigger than a computer mouse)  Cut down on sweet beverages. This includes juice, soda, and sweet tea.   Drink at least 1 glass of water with each meal and aim for at least 8 glasses per day  Exercise at least 150 minutes every week.    Preventive Care 76 Years and Older, Male Preventive care refers to lifestyle choices and visits with your health care provider that can promote health and wellness. Preventive care visits are also called wellness exams. What can I expect for my preventive care visit? Counseling During your preventive care visit, your health care provider may ask about your: Medical history, including: Past medical problems. Family medical history. History of falls. Current health, including: Emotional well-being. Home life and relationship well-being. Sexual activity. Memory and ability to understand (cognition). Lifestyle, including: Alcohol, nicotine or tobacco, and drug use. Access to firearms. Diet, exercise, and sleep habits. Work and work Statistician. Sunscreen use. Safety  issues such as seatbelt and bike helmet use. Physical exam Your health care provider will check your: Height and weight. These may be used to calculate your BMI (body mass index). BMI is a measurement that tells if you are at a healthy weight. Waist circumference. This measures the distance around your waistline. This measurement also tells if you are at a healthy weight and may help predict your risk of certain diseases, such as type 2 diabetes and high blood pressure. Heart rate and blood pressure. Body temperature. Skin for abnormal spots. What immunizations do I need?  Vaccines are usually given at various ages, according to a schedule. Your health care provider will recommend vaccines for you based on your age, medical history, and lifestyle or other factors, such as travel or where you work. What tests do I need? Screening Your health care provider may recommend screening tests for certain conditions. This may include: Lipid and cholesterol levels. Diabetes screening. This is done by checking your blood sugar (glucose) after you have not eaten for a while (fasting). Hepatitis C test. Hepatitis B test. HIV (human immunodeficiency virus) test. STI (sexually transmitted infection) testing, if you are at risk. Lung cancer screening. Colorectal cancer screening. Prostate cancer screening. Abdominal aortic aneurysm (AAA) screening. You may need this if you are a current or former smoker. Talk with your health care provider about your test results, treatment options, and if necessary, the need for more tests. Follow these instructions at  home: Eating and drinking  Eat a diet that includes fresh fruits and vegetables, whole grains, lean protein, and low-fat dairy products. Limit your intake of foods with high amounts of sugar, saturated fats, and salt. Take vitamin and mineral supplements as recommended by your health care provider. Do not drink alcohol if your health care provider tells  you not to drink. If you drink alcohol: Limit how much you have to 0-2 drinks a day. Know how much alcohol is in your drink. In the [place] one drink equals one 12 oz bottle of beer (355 mL), one 5 oz glass of wine (148 mL), or one 1 oz glass of hard liquor (44 mL). Lifestyle Brush your teeth every morning and night with fluoride toothpaste. Floss one time each day. Exercise for at least 30 minutes 5 or more days each week. Do not use any products that contain nicotine or tobacco. These products include cigarettes, chewing tobacco, and vaping devices, such as e-cigarettes. If you need help quitting, ask your health care provider. Do not use drugs. If you are sexually active, practice safe sex. Use a condom or other form of protection to prevent STIs. Take aspirin only as told by your health care provider. Make sure that you understand how much to take and what form to take. Work with your health care provider to find out whether it is safe and beneficial for you to take aspirin daily. Ask your health care provider if you need to take a cholesterol-lowering medicine (statin). Find healthy ways to manage stress, such as: Meditation, yoga, or listening to music. Journaling. Talking to a trusted person. Spending time with friends and family. Safety Always wear your seat belt while driving or riding in a vehicle. Do not drive: If you have been drinking alcohol. Do not ride with someone who has been drinking. When you are tired or distracted. While texting. If you have been using any mind-altering substances or drugs. Wear a helmet and other protective equipment during sports activities. If you have firearms in your house, make sure you follow all gun safety procedures. Minimize exposure to UV radiation to reduce your risk of skin cancer. What's next? Visit your health care provider once a year for an annual wellness visit. Ask your health care provider how often you should have your eyes and  teeth checked. Stay up to date on all vaccines. This information is not intended to replace advice given to you by your health care provider. Make sure you discuss any questions you have with your health care provider. Document Revised: 05/01/2021 Document Reviewed: 05/01/2021 Elsevier Patient Education  [place].

## 2022-07-16 NOTE — Assessment & Plan Note (Signed)
On lipitor '40mg'$  daily. Check labs.

## 2022-07-16 NOTE — Assessment & Plan Note (Signed)
Follows with orthopedics. On voltaren gel as needed and meloxicam '15mg'$  daily.

## 2022-07-16 NOTE — Assessment & Plan Note (Signed)
At goal today on losartan '100mg'$  daily.

## 2022-07-17 NOTE — Progress Notes (Signed)
Please inform patient of the following:  Labs are all stable.  Do not need to make any changes to treatment plan at this time.  He should continue to work on diet and exercise and we can recheck in year.

## 2022-07-23 DIAGNOSIS — L905 Scar conditions and fibrosis of skin: Secondary | ICD-10-CM | POA: Diagnosis not present

## 2022-07-23 DIAGNOSIS — L218 Other seborrheic dermatitis: Secondary | ICD-10-CM | POA: Diagnosis not present

## 2022-08-08 ENCOUNTER — Encounter: Payer: Self-pay | Admitting: Family Medicine

## 2022-08-08 ENCOUNTER — Ambulatory Visit (INDEPENDENT_AMBULATORY_CARE_PROVIDER_SITE_OTHER): Payer: Medicare HMO | Admitting: Family Medicine

## 2022-08-08 VITALS — BP 139/80 | HR 72 | Temp 98.6°F | Ht 70.0 in | Wt 195.4 lb

## 2022-08-08 DIAGNOSIS — J309 Allergic rhinitis, unspecified: Secondary | ICD-10-CM

## 2022-08-08 DIAGNOSIS — I10 Essential (primary) hypertension: Secondary | ICD-10-CM | POA: Diagnosis not present

## 2022-08-08 NOTE — Assessment & Plan Note (Signed)
At goal on losartan '100mg'$  daily.

## 2022-08-08 NOTE — Progress Notes (Signed)
   Carlos David is a 76 y.o. male who presents today for an office visit.  Assessment/Plan:  New/Acute Problems: Left Ear David Likely had a small abrasion in his EAC due to qtip use. He does have a small amount of dried blood present on exam. TMs are normal without perforation. It is reassuring that David is improving. No signs of infection. Will continue with watchful waiting. Recommended against use to qtips. He will let me know if this does not continue to improve. We discussed reasons to return to care.   Cough  Likely due to post nasal drip. No red flag signs or symptoms. Recommended started OTC allergy meds. He will let me know if not improving in a few weeks.   Chronic Problems Addressed Today: Essential hypertension At goal on losartan '100mg'$  daily.   Allergic rhinitis Recommended OTC claritin or zyrtec.      Subjective:  HPI:  Patient here with left ear David. Started about a week ago after using a qtip to clean his ear. Thinks the cotton swab may have broken off and he stabbed his ear canal. He did notice some bleeding but did pick out a scab a few days ago. David is improving but still notices some David when touching the outside of his ear.   He has also had a cough for the last several weeks. No fevers or chills.  No rhinorrhea. No specific treatments tried.        Objective:  Physical Exam: BP 139/80   Pulse 72   Temp 98.6 F (37 C) (Temporal)   Ht '5\' 10"'$  (1.778 m)   Wt 195 lb 6.4 oz (88.6 kg)   SpO2 97%   BMI 28.04 kg/m   Gen: No acute distress, resting comfortably HEENT: TM clear. Small amount of dried blood in left EAC. Otherwise normal ears. Nasal mucosa erythematous and boggy bilaterally with clear discharge.  CV: Regular rate and rhythm with no murmurs appreciated Pulm: Normal work of breathing, clear to auscultation bilaterally with no crackles, wheezes, or rhonchi Neuro: Grossly normal, moves all extremities Psych: Normal affect and thought content       Carlos David M. Carlos Pain, MD 08/08/2022 9:32 AM

## 2022-08-08 NOTE — Assessment & Plan Note (Signed)
Recommended OTC claritin or zyrtec.

## 2022-08-08 NOTE — Patient Instructions (Signed)
It was very nice to see you today!  You have a small abrasion in your ear. This should heal normally. Let me know if it is not improving.  Please try taking an allergy medication like allegra to see if this helps with your cough.  Let me know if your symptoms are not improving.  I will see you back when you are due for your physical. Come back sooner if needed.   Take care, Dr Jerline Pain  PLEASE NOTE:  If you had any lab tests please let us know if you have not heard back within a few days. You may see your results on mychart before we have a chance to review them but we will give you a call once they are reviewed by [place]. If we ordered any referrals today, please let us know if you have not heard from their office within the next week.   Please try these tips to maintain a healthy lifestyle:  Eat at least 3 REAL meals and 1-2 snacks per day.  Aim for no more than 5 hours between eating.  If you eat breakfast, please do so within one hour of getting up.   Each meal should contain half fruits/vegetables, one quarter protein, and one quarter carbs (no bigger than a computer mouse)  Cut down on sweet beverages. This includes juice, soda, and sweet tea.   Drink at least 1 glass of water with each meal and aim for at least 8 glasses per day  Exercise at least 150 minutes every week.

## 2022-09-01 ENCOUNTER — Other Ambulatory Visit: Payer: Self-pay | Admitting: Cardiology

## 2022-09-11 DIAGNOSIS — H524 Presbyopia: Secondary | ICD-10-CM | POA: Diagnosis not present

## 2022-09-11 DIAGNOSIS — H52203 Unspecified astigmatism, bilateral: Secondary | ICD-10-CM | POA: Diagnosis not present

## 2022-09-11 DIAGNOSIS — Z961 Presence of intraocular lens: Secondary | ICD-10-CM | POA: Diagnosis not present

## 2022-09-15 DIAGNOSIS — M5416 Radiculopathy, lumbar region: Secondary | ICD-10-CM | POA: Diagnosis not present

## 2022-09-15 DIAGNOSIS — M48062 Spinal stenosis, lumbar region with neurogenic claudication: Secondary | ICD-10-CM | POA: Diagnosis not present

## 2022-09-24 ENCOUNTER — Ambulatory Visit (INDEPENDENT_AMBULATORY_CARE_PROVIDER_SITE_OTHER): Payer: Medicare HMO

## 2022-09-24 ENCOUNTER — Ambulatory Visit: Payer: Medicare HMO | Admitting: Podiatry

## 2022-09-24 DIAGNOSIS — M7752 Other enthesopathy of left foot: Secondary | ICD-10-CM

## 2022-09-24 DIAGNOSIS — M7750 Other enthesopathy of unspecified foot: Secondary | ICD-10-CM

## 2022-09-24 NOTE — Progress Notes (Signed)
  Subjective:  Patient ID: Carlos David, male    DOB: 26-Aug-1946,  MRN: [id number]  Chief Complaint  Patient presents with   Tendonitis    Left foot pain, outside of foot near ankle - PTTD    76 y.o. male presents with the above complaint. History confirmed with patient.  He returns for follow-up of a new issue he has had worsening left foot pain after being on his foot informatory for some time  Objective:  Physical Exam: warm, good capillary refill, no trophic changes or ulcerative lesions, normal DP and PT pulses and normal sensory exam.  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 Tendonitis of ankle or foot      Plan:  Patient was evaluated and treated and all questions answered.   We reviewed his radiographs.  We discussed the presence of the arthritis likely sequela of previous occult fracture.  Discussed treatment of this with oral anti-inflammatories topical medication such as Voltaren which he has not used at home, bracing, he has a Tri-Lock ankle brace at home and will use this as well.  We also discussed corticosteroid injection which he elected for today.  Following sterile prep with Betadine 10 mg of Kenalog, 2 mg of dexamethasone and 05 cc of Marcaine 0.5% plain was injected into the fifth tarsometatarsal joint from dorsal lateral approach.  He tolerated as well as dressed with a Band-Aid.  Discussed activity restrictions and recommended he wear the brace for the next 1 to 2 weeks.  He will return to see me as needed for this or other issues  No follow-ups on file.

## 2022-09-30 ENCOUNTER — Telehealth: Payer: Self-pay | Admitting: Pharmacist

## 2022-09-30 NOTE — Progress Notes (Signed)
Chronic Care Management Pharmacy Assistant   Name: Carlos David  MRN: [id number] DOB: 1945-11-19   Reason for Encounter: General Adherence Call    Recent office visits:  08/08/2022 [facility] (PCP) Vivi Barrack, MD; Recommended OTC claritin or zyrtec   07/16/2022 [facility] (PCP) Vivi Barrack, MD; no medication changes indicated.  Recent consult visits:  09/24/2022 [facility] (Podiatry) Criselda Peaches, DPM; no medication changes indicated.  09/11/2022 [facility] (Ophthalmology) Hyman Hopes, MD; no medication changes indicated.  06/25/2022 [facility] (Cardiology) Swinyer, Lanice Schwab, NP; no medication changes indicated.  Hospital visits:  None in previous 6 months  Medications: Outpatient Encounter Medications as of 09/30/2022  Medication Sig   aspirin EC 81 MG tablet Take 1 tablet (81 mg total) by mouth daily. Swallow whole.   atorvastatin (LIPITOR) 40 MG tablet TAKE 1 TABLET BY MOUTH EVERY DAY   finasteride (PROSCAR) 5 MG tablet TAKE 1 TABLET BY MOUTH EVERY DAY   gabapentin (NEURONTIN) 300 MG capsule Take 1 capsule by mouth 2 (two) times daily.   hydrocortisone 2.5 % cream    levothyroxine (SYNTHROID) 137 MCG tablet TAKE 1 TABLET BY MOUTH EVERY DAY   losartan (COZAAR) 100 MG tablet TAKE 1 TABLET BY MOUTH EVERY DAY   meloxicam (MOBIC) 15 MG tablet Take 1 tablet (15 mg total) by mouth as needed.   No facility-administered encounter medications on file as of 09/30/2022.   Contacted Isabell Jarvis for General Review Call   Chart Review:  Have there been any documented new, changed, or discontinued medications since last visit?  (If yes, include name, dose, frequency, date) Has there been any documented recent hospitalizations or ED visits since last visit with Clinical Pharmacist?  Brief Summary (including medication and/or Diagnosis changes):   Adherence Review:  Does the Clinical Pharmacist Assistant have access to adherence rates?  Adherence rates for STAR metric medications (List  medication(s)/day supply/ last 2 fill dates). Adherence rates for medications indicated for disease state being reviewed (List medication(s)/day supply/ last 2 fill dates). Does the patient have >5 day gap between last estimated fill dates for any of the above medications or other medication gaps?  Reason for medication gaps.   Disease State Questions:  Able to connect with Patient?  Did patient have any problems with their health recently?  Note problems and Concerns: Have you had any admissions or emergency room visits or worsening of your condition(s) since last visit?  Details of ED visit, hospital visit and/or worsening condition(s): Have you had any visits with new specialists or providers since your last visit?  Explain: Have you had any new health care problem(s) since your last visit?  New problem(s) reported: Have you run out of any of your medications since you last spoke with clinical pharmacist?  What caused you to run out of your medications? Are there any medications you are not taking as prescribed?  What kept you from taking your medications as prescribed? Are you having any issues or side effects with your medications?  Note of issues or side effects: Do you have any other health concerns or questions you want to discuss with your Clinical Pharmacist before your next visit?  Note additional concerns and questions from Patient. Are there any health concerns that you feel we can do a better job addressing?  Note Patient's response. Are you having any problems with any of the following since the last visit: (select all that apply)  Details: 12. Any falls since last visit?  Details: 13. Any increased or uncontrolled pain since last visit?   Details: 14. Next visit Type:        Visit with:        Date:        Time:  38. Additional Details?   **Unsuccessful attempt at reaching patient to complete this call.**  Care Gaps: Medicare Annual Wellness: Next due on  02/28/2022 Hemoglobin A1C: 6.2% on 07/16/2022 Colonoscopy: Completed 10/02/2015  Future Appointments  Date Time Provider [place]  10/23/2022  9:00 AM Donato Heinz, MD [facility] None  03/13/2023  1:00 PM [facility]  07/20/2023  8:40 AM Vivi Barrack, MD [facility]   Star Rating Drugs: Atorvastatin 40 mg last filled 09/01/2022 90 DS Losartan 100 mg last filled 09/28/2022 90 DS  April D Calhoun, [place] Pharmacist Assistant [phone]

## 2022-10-22 NOTE — Progress Notes (Unsigned)
Cardiology Office Note:    Date:  10/23/2022   ID:  Carlos David, DOB 07/12/1946, MRN [id number]  PCP:  Vivi Barrack, MD  Cardiologist:  Donato Heinz, MD  Electrophysiologist:  None   Referring MD: Vivi Barrack, MD   No chief complaint on file.    History of Present Illness:    Carlos David is a 76 y.o. male with a hx of thyroid cancer now hypothyroidism, hyperlipidemia who presents for follow-up.  He was referred by [name], PA-C for evaluation of chest pain, initially seen on 01/19/2020.  He reported that he has been having chest pain for the preceding few months.  Occured daily.  Chest pain is located under his left breast.  Describes as pinching pain.  Can last for few minutes.  Typically occurs at rest.  Reports that he exercises at the [facility], will do the elliptical for 30 minutes.  Has not noted exertional chest pain.  Does report some lightheadedness with standing.  Denies any syncope, palpitations, lower extremity edema.  Smoked cigarettes as teenager for few years.  Smoked pipe for 10 years.  Mother had MI in 79s.    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  Lexiscan Myoview on 08/07/2021 showed normal perfusion, EF 51%.  Since last clinic visit, he reports he has been doing well.  Reports occasional shortness of breath, denies any chest pain,  lightheadedness, syncope, lower extremity edema, or palpitations.  He reports he tries to go to the gym 3 days/week.  Cannot do elliptical or bicycle anymore due to the knee issues, mostly does weight machines.    Past Medical History:  Diagnosis Date   Actinic keratosis 12/16/2010   Qualifier: Diagnosis of  By:  [name]  MD, [name]    BPH (benign prostatic hyperplasia)    Cancer [facility])    thyroid cancer 1990   Cataract    both eyes   History of radiation therapy 1990   HYPERLIPIDEMIA 12/26/2008   Hypertension    HYPOTHYROIDISM 12/26/2008   PPD positive    Thoracic aortic aneurysm ([place])    THYROID CANCER, HX OF 1990   THYROID CANCER, HX OF 12/26/2008   Qualifier: Diagnosis of  By: [name]  MD, [name]     Past Surgical History:  Procedure Laterality Date   CATARACT EXTRACTION, BILATERAL  2020   COLONOSCOPY     10-ye ago-normal at [facility]   [place]     as child   HAND SURGERY     right hand trigger figger    INGUINAL HERNIA REPAIR Right 02/25/2022   Procedure: OPEN RIGHT INGUINAL HERNIA REPAIR WITH MESH;  Surgeon: Kinsinger, Arta Bruce, MD;  Location: [place];  Service: General;  Laterality: Right;   neckotomy  1990   20 lymph nodes removed from left neck   TOTAL THYROIDECTOMY  1990   TRANSURETHRAL RESECTION OF PROSTATE N/A 01/21/2019   Procedure: TRANSURETHRAL RESECTION OF THE PROSTATE (TURP);  Surgeon: [name], MD;  Location: [facility];  Service: Urology;  Laterality: N/A;  75 MINS    Current Medications: Current Meds  Medication Sig   aspirin EC 81 MG tablet Take 1 tablet (81  mg total) by mouth daily. Swallow whole.   finasteride (PROSCAR) 5 MG tablet TAKE 1 TABLET BY MOUTH EVERY DAY   gabapentin (NEURONTIN) 300 MG capsule Take 1 capsule by mouth 2 (two) times daily.   hydrocortisone 2.5 % cream    levothyroxine (SYNTHROID) 137 MCG tablet TAKE 1 TABLET BY MOUTH EVERY DAY   losartan (COZAAR) 100 MG tablet TAKE 1 TABLET BY MOUTH EVERY DAY   meloxicam (MOBIC) 15 MG tablet Take 1 tablet (15 mg total) by mouth as needed.   [DISCONTINUED] atorvastatin (LIPITOR) 40 MG tablet TAKE 1 TABLET BY MOUTH EVERY DAY     Allergies:   Penicillins   Social History   Socioeconomic History   Marital status: Married    Spouse name: Not on  file   Number of children: Not on file   Years of education: Not on file   Highest education level: Not on file  Occupational History   Not on file  Tobacco Use   Smoking status: Former    Types: Cigarettes, Pipe    Quit date: 11/17/1968    Years since quitting: 53.9   Smokeless tobacco: Never  Vaping Use   Vaping Use: Never used  Substance and Sexual Activity   Alcohol use: Yes    Alcohol/week: 0.0 standard drinks of alcohol    Comment: occas. beer   Drug use: No   Sexual activity: Not on file  Other Topics Concern   Not on file  Social History Narrative   Not on file   Social Determinants of Health   Financial Resource Strain: Low Risk  (02/28/2022)   Overall Financial Resource Strain (CARDIA)    Difficulty of Paying Living Expenses: Not hard at all  Food Insecurity: No Food Insecurity (02/28/2022)   Hunger Vital Sign    Worried About Running Out of Food in the Last Year: Never true    Ran Out of Food in the Last Year: Never true  Transportation Needs: No Transportation Needs (02/28/2022)   PRAPARE - Hydrologist (Medical): No    Lack of Transportation (Non-Medical): No  Physical Activity: Inactive (02/28/2022)   Exercise Vital Sign    Days of Exercise per Week: 0 days    Minutes of Exercise per Session: 0 min  Stress: No Stress Concern Present (02/28/2022)   [place]    Feeling of Stress : Not at all  Social Connections: Moderately Integrated (02/28/2022)   Social Connection and Isolation Panel [NHANES]    Frequency of Communication with Friends and Family: Twice a week    Frequency of Social Gatherings with Friends and Family: More than three times a week    Attends Religious Services: More than 4 times per year    Active Member of [employer] or Organizations: No    Attends Archivist Meetings: Never    Marital Status: Married     Family History: The patient's  family history is negative for Colon cancer.  ROS:   Please see the history of present illness.     All other systems reviewed and are negative.  EKGs/Labs/Other Studies Reviewed:    The following studies were reviewed today:   EKG:   10/23/2022: Normal sinus rhythm, rate 69, left axis deviation, no ST abnormality 02/06/22: NSR, rate 68. LAD, no ST abnormalities 07/30/2021: Normal sinus rhythm, rate 74, no ST abnormalities 04/22/2021: NSR, rate 65, No ST abnormalities 01/19/2020: normal sinus rhythm, rate  69, left axis deviation, no ST/T abnormalities  Lexiscan Myoview 11/28/16: Nuclear stress EF: 54%. The study is normal. This is a low risk study. The left ventricular ejection fraction is mildly decreased (45-54%). There was no ST segment deviation noted during stress.   Normal resting and stress perfusion. No ischemia or infarction EF 54%   Coronary CTA 03/15/20: IMPRESSION: 1. Coronary calcium score of 526. This was 4 percentile for age and sex matched control.   2. Normal coronary origin with right dominance.   3. CAD-RADS 3. Mild stenosis in the proximal and moderate stenosis in the mid LAD. Consider symptom-guided anti-ischemic pharmacotherapy as well as risk factor modification per guideline directed care. Additional analysis with CT FFR will be submitted.   4. 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 No dissection.   5. Mildly dilated pulmonary artery measuring 33 mm.  1. Left Main: 0.99.   2. LAD: Proximal: 0.96, mid: 0.86, distal: 0.83. 3. LCX: 0.94 4. OM1: 0.96. 5. OM2: 9.97. 6. RCA: Proximal: 0.98, distal: 0.96.   IMPRESSION: 1. CT FFR analysis didn't show any significant stenosis. Aggressive medical management is recommended.    IMPRESSION: 1.  No acute findings in the imaged extracardiac chest. 2.  Aortic Atherosclerosis (ICD10-I70.0).   TTE 02/06/20:  1. Left ventricular ejection fraction, by estimation, is 60 to 65%. The  left  ventricle has normal function. The left ventricle has no regional  wall motion abnormalities. There is mild asymmetric left ventricular  hypertrophy. Left ventricular diastolic  parameters are indeterminate.   2. Right ventricular systolic function is normal. The right ventricular  size is normal.   3. The mitral valve is normal in structure. Mild mitral valve  regurgitation. No evidence of mitral stenosis.   4. The aortic valve is normal in structure. Aortic valve regurgitation is  not visualized. No aortic stenosis is present.   5. Aortic dilatation noted. There is mild to moderate dilatation of the  ascending aorta measuring 44 mm.   CT FFR 03/15/2020: FINDINGS: FFRct analysis was performed on the original cardiac CT angiogram dataset. Diagrammatic representation of the FFRct analysis is provided in a separate PDF document in PACS. This dictation was created using the PDF document and an interactive 3D model of the results. 3D model is not available in the EMR/PACS. Normal FFR range is >0.80.   1. Left Main: 0.99. 2. LAD: Proximal: 0.96, mid: 0.86, distal: 0.83. 3. LCX: 0.94 4. OM1: 0.96. 5. OM2: 9.97. 6. RCA: Proximal: 0.98, distal: 0.96.   IMPRESSION: 1. CT FFR analysis didn't show any significant stenosis. Aggressive medical management is recommended.  CT Angiography Chest 03/13/2021: IMPRESSION: 1. 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 Recommend annual imaging followup by CTA or MRA. This recommendation follows 2010 ACCF/AHA/AATS/ACR/ASA/SCA/SCAI/SIR/STS/SVM Guidelines for the Diagnosis and Management of Patients with Thoracic Aortic Disease. Circulation. 2010; 121: Q734-L937. Aortic aneurysm NOS (ICD10-I71.9) 2. No acute chest abnormality. 3. Small pleural-based nodular density in the right upper lobe measuring roughly 5 mm. This is indeterminate. No follow-up needed if patient is low-risk. Non-contrast chest CT can be  considered in 12 months if patient is high-risk. This recommendation follows the consensus statement: Guidelines for Management of Incidental Pulmonary Nodules Detected on CT Images: From the Fleischner Society 2017; Radiology 2017; 284:228-243. 4. Evidence for small tracheal diverticula. Recommend attention on follow-up imaging.  Recent Labs: 07/16/2022: ALT 21; BUN 23; Creatinine, Ser 1.02; Hemoglobin 14.6; Platelets 168.0; Potassium 4.0; Sodium  138; TSH 1.37  Recent Lipid Panel    Component Value Date/Time   CHOL 178 07/16/2022 0846   CHOL 137 07/05/2020 0823   TRIG 87.0 07/16/2022 0846   HDL 73.40 07/16/2022 0846   HDL 54 07/05/2020 0823   CHOLHDL 2 07/16/2022 0846   VLDL 17.4 07/16/2022 0846   LDLCALC 87 07/16/2022 0846   LDLCALC 68 07/05/2020 0823    Physical Exam:    VS:  BP (!) 140/76   Pulse 69   Ht '5\' 10"'$  (1.778 m)   Wt 201 lb 6.4 oz (91.4 kg)   SpO2 98%   BMI 28.90 kg/m     Wt Readings from Last 3 Encounters:  10/23/22 201 lb 6.4 oz (91.4 kg)  08/08/22 195 lb 6.4 oz (88.6 kg)  07/16/22 191 lb 9.6 oz (86.9 kg)     GEN:  Well nourished, well developed in no acute distress HEENT: Normal NECK: No JVD; No carotid bruits LYMPHATICS: No lymphadenopathy CARDIAC: RRR, no murmurs, rubs, gallops RESPIRATORY:  Clear to auscultation without rales, wheezing or rhonchi  ABDOMEN: Soft, non-tender, non-distended MUSCULOSKELETAL:  No edema; No deformity  SKIN: Warm and dry NEUROLOGIC:  Alert and oriented x 3 PSYCHIATRIC:  Normal affect   ASSESSMENT:    1. CAD in native artery   2. Essential hypertension   3. Hyperlipidemia, unspecified hyperlipidemia type   4. Aortic dilatation (HCC)       PLAN:     CAD: Presented with atypical chest pain.  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  He  reports chest pain has resolved but now having dyspnea on exertion, which could represent anginal equivalent.  Lexiscan Myoview on 08/07/2021 showed normal perfusion, EF 51%. -Continue atorvastatin, will increase to 80 mg daily -Continue aspirin 81 mg daily  Hypertension: On losartan 100 mg daily.  BP mildly elevated in clinic today, asked to check BP twice daily for next week and let us know results  Hyperlipidemia: on atorvastatin 40 mg daily.  LDL 87 on 07/16/22.  Increase atorvastatin to 80 mg daily  Aortic dilatation: ascending aorta measures 89m on CTA.  Repeat CTA on 02/2021 showed stable ascending aortic dilatation measuring 40 mm.  CTA chest 02/2022 showed stable 41 mm ascending aorta -Follow-up with echocardiogram in 1 year  Pulmonary nodule: 5 mm right upper lobe nodule noted on CTA chest 03/13/2021.  Follow-up CT chest in 1 year (02/2022) showed stable right upper lobe nodule, no further workup recommended  Leg numbness: Likely due to known spinal stenosis.  Normal ABIs  04/16/22   RTC in 6 months   Medication Adjustments/Labs and Tests Ordered: Current medicines are reviewed at length with the patient today.  Concerns regarding medicines are outlined above.  Orders Placed This Encounter  Procedures   Lipid panel   EKG 12-Lead   ECHOCARDIOGRAM COMPLETE    Meds ordered this encounter  Medications   atorvastatin (LIPITOR) 80 MG tablet    Sig: Take 1 tablet (80 mg total) by mouth daily.    Dispense:  90 tablet    Refill:  3    Dose increase     Patient Instructions  Medication Instructions:  INCREASE atorvastatin (Lipitor) to 80 mg daily  *If you need a refill on your cardiac medications before your next appointment, please call your pharmacy*   Lab Work:  Please return for FASTING labs in 2 months (Lipid)  Our in office lab hours are Monday-Friday 8:00-4:00, closed for lunch 12:45-1:45 pm.  No appointment needed.  [facility] locations:   [place] ([place]) - [address] [address] - [address] [place] - [address] Oncologist)  Testing/Procedures: Your physician has requested that you have an echocardiogram in 6 MONTHS ([place]). Echocardiography is a painless test that uses sound waves to create images of your heart. It provides your doctor with information about the size and shape of your heart and how well your heart's chambers and valves are working. This procedure takes approximately one hour. There are no restrictions for this procedure. Please do NOT wear cologne, perfume, aftershave, or lotions (deodorant is allowed). Please arrive 15 minutes prior to your appointment time.  Follow-Up: At [facility], you and your health needs are our priority.  As part of our continuing mission to provide you with exceptional heart care, we have created designated Provider Care Teams.  These Care Teams include your primary Cardiologist (physician) and Advanced Practice Providers (APPs -  Physician Assistants and Nurse Practitioners) who all work together to provide you with the care you need, when you need it.  We recommend signing up for the patient portal called "MyChart".  Sign up information is provided on this After Visit Summary.  MyChart is used to connect with patients for Virtual Visits (Telemedicine).  Patients are able to view lab/test results, encounter notes, upcoming appointments, etc.  Non-urgent messages can be sent to your provider as well.   To learn more about what you can do with MyChart, go to [email or web address].    Your next appointment:   6 month(s)  The format for your next appointment:   In Person  Provider:    Donato Heinz, MD     Other Instructions Please check your blood pressure at home twice daily, write it down.  Call the office or send message via Mychart with the readings in 1 week for Dr. Gardiner Rhyme to review.             Signed, Donato Heinz, MD  10/23/2022 10:14 AM    [place]

## 2022-10-23 ENCOUNTER — Ambulatory Visit: Payer: Medicare HMO | Attending: Cardiology | Admitting: Cardiology

## 2022-10-23 ENCOUNTER — Encounter: Payer: Self-pay | Admitting: Cardiology

## 2022-10-23 VITALS — BP 140/76 | HR 69 | Ht 70.0 in | Wt 201.4 lb

## 2022-10-23 DIAGNOSIS — I2510 Atherosclerotic heart disease of native coronary artery without angina pectoris: Secondary | ICD-10-CM

## 2022-10-23 DIAGNOSIS — I77819 Aortic ectasia, unspecified site: Secondary | ICD-10-CM | POA: Diagnosis not present

## 2022-10-23 DIAGNOSIS — I10 Essential (primary) hypertension: Secondary | ICD-10-CM | POA: Diagnosis not present

## 2022-10-23 DIAGNOSIS — E785 Hyperlipidemia, unspecified: Secondary | ICD-10-CM | POA: Diagnosis not present

## 2022-10-23 MED ORDER — ATORVASTATIN CALCIUM 80 MG PO TABS: 80.0000 mg | ORAL_TABLET | Freq: Every day | ORAL | 3 refills | Status: DC

## 2022-10-23 NOTE — Patient Instructions (Signed)
Medication Instructions:  INCREASE atorvastatin (Lipitor) to 80 mg daily  *If you need a refill on your cardiac medications before your next appointment, please call your pharmacy*   Lab Work: Please return for FASTING labs in 2 months (Lip[place])  Our in office lab hours are Monday-Friday 8:00-4:00, closed for lunch 12:45-1:45 pm.  No appointment needed.  [facility] locations:   [place] ([place]) - [address] [place] [address] - [address] [place] - [address] Oncologist)  Testing/Procedures: Your physician has requested that you have an echocardiogram in 6 MONTHS ([place]). Echocardiography is a painless test that uses sound waves to create images of your heart. It provides your doctor with information about the size and shape of your heart and how well your heart's chambers and valves are working. This procedure takes approximately one hour. There are no restrictions for this procedure. Please do NOT wear cologne, perfume, aftershave, or lotions (deodorant is allowed). Please arrive 15 minutes prior to your appointment time.  Follow-Up: At [facility], you and your health needs are our priority.  As part of our continuing mission to provide you with exceptional heart care, we have created designated Provider Care Teams.  These Care Teams include your primary Cardiologist (physician) and Advanced Practice Providers (APPs -  Physician Assistants and Nurse Practitioners) who all work together to provide you with the care you need, when you need it.  We recommend signing up for the patient portal called "MyChart".  Sign up information is provided on this After Visit Summary.  MyChart is  used to connect with patients for Virtual Visits (Telemedicine).  Patients are able to view lab/test results, encounter notes, upcoming appointments, etc.  Non-urgent messages can be sent to your provider as well.   To learn more about what you can do with MyChart, go to [email or web address].    Your next appointment:   6 month(s)  The format for your next appointment:   In Person  Provider:   Donato Heinz, MD     Other Instructions Please check your blood pressure at home twice daily, write it down.  Call the office or send message via Mychart with the readings in 1 week for Dr. Gardiner Rhyme to review.

## 2022-10-30 DIAGNOSIS — M48062 Spinal stenosis, lumbar region with neurogenic claudication: Secondary | ICD-10-CM | POA: Diagnosis not present

## 2022-11-25 ENCOUNTER — Other Ambulatory Visit: Payer: Self-pay | Admitting: Family Medicine

## 2022-12-03 ENCOUNTER — Ambulatory Visit: Payer: Medicare HMO | Admitting: Orthopedic Surgery

## 2022-12-10 ENCOUNTER — Ambulatory Visit: Payer: Medicare HMO | Admitting: Podiatry

## 2022-12-10 DIAGNOSIS — M7671 Peroneal tendinitis, right leg: Secondary | ICD-10-CM

## 2022-12-10 DIAGNOSIS — M19272 Secondary osteoarthritis, left ankle and foot: Secondary | ICD-10-CM

## 2022-12-10 DIAGNOSIS — M7672 Peroneal tendinitis, left leg: Secondary | ICD-10-CM

## 2022-12-10 NOTE — Patient Instructions (Addendum)
Call Phone: [phone] to schedule PT

## 2022-12-13 NOTE — Progress Notes (Signed)
  Subjective:  Patient ID: Carlos David, male    DOB: 07/11/1946,  MRN: [id number]  Chief Complaint  Patient presents with   Tendonitis    left foot ankle pain, follow up tendonitis    77 y.o. male presents with the above complaint. History confirmed with patient.  Left foot injection previously helped some.  Still very painful  Objective:  Physical Exam: warm, good capillary refill, no trophic changes or ulcerative lesions, normal DP and PT pulses and normal sensory exam.  Pain with palpation to base of fifth metatarsal and fifth TMT and fourth TMT, pain with resisted eversion along the peroneus brevis  Radiographs: X-ray of left foot taken today multiple views show a previously healed avulsion fracture of the fifth metatarsal base, there is joint space narrowing and subchondral sclerosis with osteoarthrosis of the fifth tarsometatarsal joint Assessment:   1. Peroneal tendinitis of both lower legs   2. Other secondary osteoarthritis of left foot      Plan:  Patient was evaluated and treated and all questions answered.   He will continues in the Tri-Lock ankle brace and Voltaren.  I also recommended physical therapy.  Referral was sent.  We also discussed repeat corticosteroid injection which he elected for today.  Following sterile prep with Betadine 10 mg of Kenalog, 2 mg of dexamethasone and 05 cc of Marcaine 0.5% plain was injected into the fifth tarsometatarsal joint from dorsal lateral approach.  He tolerated as well as dressed with a Band-Aid.  Discussed activity restrictions and recommended he wear the brace for the next 1 to 2 weeks.  He will return to see me as needed for this or other issues  Return in about 2 months (around 02/08/2023) for re-check peroneal tendinitis.

## 2022-12-17 ENCOUNTER — Ambulatory Visit: Payer: Self-pay

## 2022-12-17 ENCOUNTER — Ambulatory Visit (INDEPENDENT_AMBULATORY_CARE_PROVIDER_SITE_OTHER): Payer: Medicare HMO | Admitting: Orthopedic Surgery

## 2022-12-17 ENCOUNTER — Encounter: Payer: Self-pay | Admitting: Orthopedic Surgery

## 2022-12-17 DIAGNOSIS — S83207A Unspecified tear of unspecified meniscus, current injury, left knee, initial encounter: Secondary | ICD-10-CM

## 2022-12-17 DIAGNOSIS — M25562 Pain in left knee: Secondary | ICD-10-CM

## 2022-12-17 MED ORDER — METHYLPREDNISOLONE ACETATE 40 MG/ML IJ SUSP
40.0000 mg | INTRAMUSCULAR | Status: AC | PRN
  Administered 2022-12-17: 40 mg via INTRA_ARTICULAR

## 2022-12-17 MED ORDER — LIDOCAINE HCL 1 % IJ SOLN
5.0000 mL | INTRAMUSCULAR | Status: AC | PRN
  Administered 2022-12-17: 5 mL

## 2022-12-17 MED ORDER — BUPIVACAINE HCL 0.25 % IJ SOLN
4.0000 mL | INTRAMUSCULAR | Status: AC | PRN
  Administered 2022-12-17: 4 mL via INTRA_ARTICULAR

## 2022-12-17 NOTE — Progress Notes (Signed)
Office Visit Note   Patient: Carlos David           Date of Birth: 1946-01-07           MRN: [id number] Visit Date: 12/17/2022 Requested by: Vivi Barrack, MD [address] [place],   [address] PCP: Vivi Barrack, MD  Subjective: Chief Complaint  Patient presents with   Knee Pain    HPI: Carlos David is a 77 y.o. male who presents to the office reporting left knee pain.  He had a twisting injury affecting his left knee years ago.  Has been previously treated at another orthopedic office.  Was scheduled for surgery in October or November but his wife had total hip replacement and she has been having some problems since that time.  He has not had any injections.  Has good and bad days.  He is wearing a brace from [employer].  Describes mild weakness and giving way with the brace.  Has reported pinching pain in the past at night.  Pain does wake him from sleep.  Describes a little bit of catching.  He likes to help his brother at the [employer].  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  Has a history of thyroid cancer at age 22.  Sees Dr. Kathyrn Sheriff for spine and has a history of epidural steroid injections with Dr. Brien Few.  MRI scan imaging is reviewed with the patient.  He has a nondisplaced oblique meniscal tear through the medial meniscus with possible cyst extending underneath the MCL proximally from that meniscal tear..                ROS: All systems reviewed are negative as they relate to the chief complaint within the history of present illness.  Patient denies fevers or chills.  Assessment & Plan: Visit Diagnoses:  1. Left knee pain, unspecified chronicity     Plan: Impression is left knee degenerative medial meniscal tear with cyst just underneath the MCL.  Plan is injection of the left knee today.  Ultrasound imaging of the cyst demonstrates that it to could be injected if warranted.  Would be more difficult to decompress based on its relatively small size.  Plan  to come back in 4 weeks for clinical recheck and decision for or against arthroscopic intervention at that time.  [place] has gardening and planting to do extensively starting in May.  Follow-Up Instructions: No follow-ups on file.   Orders:  No orders of the defined types were placed in this encounter.  No orders of the defined types were placed in this encounter.     Procedures: Large Joint Inj: L knee on 12/17/2022 11:31 PM Indications: diagnostic evaluation, joint swelling and pain Details: 18 G 1.5 in needle, superolateral approach  Arthrogram: No  Medications: 5 mL lidocaine 1 %; 40 mg methylPREDNISolone acetate 40 MG/ML; 4 mL bupivacaine 0.25 % Outcome: tolerated well, no immediate complications Procedure, treatment alternatives, risks and benefits explained, specific risks discussed. Consent was given by the patient. Immediately prior to procedure a time out was called to verify the correct patient, procedure, equipment, support staff and site/side marked as required. Patient was prepped and draped in the usual sterile fashion.       Clinical Data: No additional findings.  Objective: Vital Signs: There were no vitals taken for this visit.  Physical Exam:  Constitutional: Patient appears well-developed HEENT:  Head: Normocephalic Eyes:EOM are normal Neck: Normal range of motion Cardiovascular: Normal rate  Pulmonary/chest: Effort normal Neurologic: Patient is alert Skin: Skin is warm Psychiatric: Patient has normal mood and affect  Ortho Exam: Ortho exam demonstrates normal gait alignment.  Has palpable pedal pulses.  Has medial greater than lateral joint line tenderness on the left knee.  Range of motion is full.  Extensor mechanism intact and nontender.  He does have a cystic structure on the medial joint line region.  Collateral cruciate ligaments are stable.  Specialty Comments:  No specialty comments available.  Imaging: No results found.   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 12/17/2018   Erectile dysfunction 12/17/2018   Neck pain 09/15/2018   Lumbar radiculopathy 10/14/2016   BPH s/p TURP 2020 03/02/2014   Osteoarthritis 07/16/2012   Hypothyroidism s/p thyroidectomy 1990 due to thyroid cancer 12/26/2008   Dyslipidemia 12/26/2008   Past Medical History:  Diagnosis Date   Actinic keratosis 12/16/2010   Qualifier: Diagnosis of  By: [name]  MD, [name]    BPH (benign prostatic hyperplasia)    Cancer ([place])    thyroid cancer 1990   Cataract    both eyes   History of radiation therapy 1990   HYPERLIPIDEMIA 12/26/2008   Hypertension    HYPOTHYROIDISM 12/26/2008   PPD positive    Thoracic aortic aneurysm ([place])    THYROID CANCER, HX OF 1990   THYROID CANCER, HX OF 12/26/2008   Qualifier: Diagnosis of  By: [name]  MD, [name]     Family History  Problem Relation Age of Onset   Colon cancer Neg Hx     Past Surgical History:  Procedure Laterality Date   CATARACT EXTRACTION, BILATERAL  2020   COLONOSCOPY     10-ye ago-normal at [facility]   [place]     as child   HAND SURGERY     right hand trigger figger    INGUINAL HERNIA REPAIR Right 02/25/2022   Procedure: OPEN RIGHT INGUINAL HERNIA REPAIR WITH MESH;  Surgeon: Kinsinger, Arta Bruce, MD;  Location: [place];  Service: General;  Laterality: Right;   neckotomy  1990   20 lymph nodes removed from left neck   [place] N/A 01/21/2019   Procedure: TRANSURETHRAL RESECTION OF THE PROSTATE (TURP);  Surgeon: [name], MD;  Location: [facility];  Service: Urology;  Laterality: N/A;  62 MINS   Social History   Occupational History   Not on file  Tobacco Use    Smoking status: Former    Types: Cigarettes, Pipe    Quit date: 11/17/1968    Years since quitting: 54.1   Smokeless tobacco: Never  Vaping Use   Vaping Use: Never used  Substance and Sexual Activity   Alcohol use: Yes    Alcohol/week: 0.0 standard drinks of alcohol    Comment: occas. beer   Drug use: No   Sexual activity: Not on file

## 2022-12-19 ENCOUNTER — Encounter: Payer: Self-pay | Admitting: Cardiology

## 2022-12-19 DIAGNOSIS — L821 Other seborrheic keratosis: Secondary | ICD-10-CM | POA: Diagnosis not present

## 2022-12-19 DIAGNOSIS — L570 Actinic keratosis: Secondary | ICD-10-CM | POA: Diagnosis not present

## 2022-12-19 DIAGNOSIS — L814 Other melanin hyperpigmentation: Secondary | ICD-10-CM | POA: Diagnosis not present

## 2022-12-19 DIAGNOSIS — L578 Other skin changes due to chronic exposure to nonionizing radiation: Secondary | ICD-10-CM | POA: Diagnosis not present

## 2022-12-19 DIAGNOSIS — D225 Melanocytic nevi of trunk: Secondary | ICD-10-CM | POA: Diagnosis not present

## 2022-12-19 DIAGNOSIS — Z86006 Personal history of melanoma in-situ: Secondary | ICD-10-CM | POA: Diagnosis not present

## 2022-12-19 DIAGNOSIS — Z08 Encounter for follow-up examination after completed treatment for malignant neoplasm: Secondary | ICD-10-CM | POA: Diagnosis not present

## 2022-12-21 ENCOUNTER — Encounter: Payer: Self-pay | Admitting: Orthopedic Surgery

## 2022-12-22 ENCOUNTER — Encounter: Payer: Self-pay | Admitting: Cardiology

## 2022-12-22 NOTE — Telephone Encounter (Signed)
Recommend decrease atorvastatin back to 40 mg daily and add Zetia 10 mg daily

## 2022-12-22 NOTE — Telephone Encounter (Signed)
Could see if Dr. Marlou Sa could do ultrasound-guided aspiration/injection of that cyst before considering surgery

## 2022-12-23 MED ORDER — EZETIMIBE 10 MG PO TABS: 10.0000 mg | ORAL_TABLET | Freq: Every day | ORAL | 3 refills | Status: DC

## 2022-12-23 MED ORDER — ATORVASTATIN CALCIUM 40 MG PO TABS: 40.0000 mg | ORAL_TABLET | Freq: Every day | ORAL | 3 refills | Status: DC

## 2022-12-23 NOTE — Telephone Encounter (Signed)
Yeah, betsy you are correct

## 2022-12-29 ENCOUNTER — Ambulatory Visit: Payer: Medicare HMO | Admitting: Orthopedic Surgery

## 2022-12-29 DIAGNOSIS — M79672 Pain in left foot: Secondary | ICD-10-CM | POA: Diagnosis not present

## 2022-12-29 DIAGNOSIS — E785 Hyperlipidemia, unspecified: Secondary | ICD-10-CM | POA: Diagnosis not present

## 2022-12-29 DIAGNOSIS — M2022 Hallux rigidus, left foot: Secondary | ICD-10-CM

## 2022-12-29 LAB — LIPID PANEL
Chol/HDL Ratio: 2.3 ratio (ref 0.0–5.0)
Cholesterol, Total: 153 mg/dL (ref 100–199)
HDL: 68 mg/dL (ref >39)
LDL Chol Calc (NIH): 72 mg/dL (ref 0–99)
Triglycerides: 64 mg/dL (ref 0–149)
VLDL Cholesterol Cal: 13 mg/dL (ref 5–40)

## 2022-12-30 ENCOUNTER — Encounter: Payer: Self-pay | Admitting: Orthopedic Surgery

## 2022-12-30 ENCOUNTER — Other Ambulatory Visit: Payer: Self-pay | Admitting: *Deleted

## 2022-12-30 DIAGNOSIS — I2510 Atherosclerotic heart disease of native coronary artery without angina pectoris: Secondary | ICD-10-CM

## 2022-12-30 DIAGNOSIS — E785 Hyperlipidemia, unspecified: Secondary | ICD-10-CM

## 2022-12-30 NOTE — Progress Notes (Signed)
Office Visit Note   Patient: Carlos David           Date of Birth: [birth year]           MRN: [id number] Visit Date: 12/29/2022              Requested by: Vivi Barrack, MD [address] [place],  [place] [address] PCP: Vivi Barrack, MD  Chief Complaint  Patient presents with   Left Foot - Pain      HPI: Patient is a 77 year old gentleman who is seen for initial evaluation for lateral left foot pain.  Patient states he has bad arthritis in all of his extremities.  He states that the arthritis has gotten worse since he started increasing the dose of his statins.  He states that his Lipitor was up to 80 mg he cut it down to 40 and a lot of his arthritic pain has improved.  Patient has tried an ankle brace Voltaren gel and physical therapy.  He states he is status post a steroid injection fifth toe MTP joint in January.  Assessment & Plan: Visit Diagnoses:  1. Pain in left foot   2. Hallux rigidus, left foot     Plan: A pressure relief was cut out for the first metatarsal head and great toe to allow for valgus of the hindfoot and unload pressure on the lateral column.  At follow-up will reevaluate for further treatment patient may benefit from a carbon plate or fusion of the great toe MTP joint.  Follow-Up Instructions: Return in about 4 weeks (around 01/26/2023).   Ortho Exam  Patient is alert, oriented, no adenopathy, well-dressed, normal affect, normal respiratory effort. Examination patient has a good dorsalis pedis pulse no arterial insufficiency he has radiographs which shows bone-on-bone contact of the MTP joint of the great toe and has limited range of motion of the great toe.  He does have good dorsiflexion of the ankle of about 20 degrees.  He has pes planus and pain to palpation along the lateral border of the left foot.  The peroneal tendons are nontender to palpation.  Patient has a plantarflexed first ray.  With weightbearing he is rolling his foot to the lateral side  to unload pressure from the first metatarsal head.  Imaging: No results found. No images are attached to the encounter.  Labs: Lab Results  Component Value Date   HGBA1C 6.2 07/16/2022   HGBA1C 6.1 07/10/2021     Lab Results  Component Value Date   ALBUMIN 4.2 07/16/2022   ALBUMIN 4.2 07/10/2021   ALBUMIN 4.3 01/18/2020    No results found for: "MG" No results found for: "VD25OH"  No results found for: "PREALBUMIN"    Latest Ref Rng & Units 07/16/2022    8:46 AM 07/10/2021   10:31 AM 01/18/2020    2:43 PM  CBC EXTENDED  WBC 4.0 - 10.5 K/uL 6.2  6.2  7.8   RBC 4.22 - 5.81 Mil/uL 4.75  4.46  4.74   Hemoglobin 13.0 - 17.0 g/dL 14.6  13.6  14.3   HCT 39.0 - 52.0 % 43.7  40.1  41.9   Platelets 150.0 - 400.0 K/uL 168.0  164.0  194.0   NEUT# 1.4 - 7.7 K/uL   4.8   Lymph# 0.7 - 4.0 K/uL   2.0      There is no height or weight on file to calculate BMI.  Orders:  No orders of the defined types were  placed in this encounter.  No orders of the defined types were placed in this encounter.    Procedures: No procedures performed  Clinical Data: No additional findings.  ROS:  All other systems negative, except as noted in the HPI. Review of Systems  Objective: Vital Signs: There were no vitals taken for this visit.  Specialty Comments:  No specialty comments available.  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 12/26/2008   Dyslipidemia 12/26/2008   Past Medical History:  Diagnosis Date   Actinic keratosis 12/16/2010   Qualifier: Diagnosis  of  By: [name]  MD, [name]    BPH (benign prostatic hyperplasia)    Cancer ([place])    thyroid cancer 1990   Cataract    both eyes   History of radiation therapy 1990   HYPERLIPIDEMIA 12/26/2008   Hypertension    HYPOTHYROIDISM 12/26/2008   PPD positive    Thoracic aortic aneurysm ([employer])    THYROID CANCER, HX OF 1990   THYROID CANCER, HX OF 12/26/2008   Qualifier: Diagnosis of  By: [name]  MD, [name]     Family History  Problem Relation Age of Onset   Colon cancer Neg Hx     Past Surgical History:  Procedure Laterality Date   CATARACT EXTRACTION, BILATERAL  2020   COLONOSCOPY     10-ye ago-normal at [facility]   [place]     as child   HAND SURGERY     right hand trigger figger    INGUINAL HERNIA REPAIR Right 02/25/2022   Procedure: OPEN RIGHT INGUINAL HERNIA REPAIR WITH MESH;  Surgeon: Kinsinger, Arta Bruce, MD;  Location: [place];  Service: General;  Laterality: Right;   neckotomy  1990   20 lymph nodes removed from left neck   [place] N/A 01/21/2019   Procedure: TRANSURETHRAL RESECTION OF THE PROSTATE (TURP);  Surgeon: [name], MD;  Location: [facility];  Service: Urology;  Laterality: N/A;  56 MINS   Social History   Occupational History   Not on file  Tobacco Use   Smoking status: Former    Types: Cigarettes, Pipe    Quit date: 11/17/1968    Years since quitting: 54.1   Smokeless tobacco: Never  Vaping Use   Vaping Use: Never used  Substance and Sexual Activity   Alcohol use: Yes    Alcohol/week: 0.0 standard drinks of alcohol    Comment: occas. beer   Drug use: No   Sexual activity: Not on file

## 2023-01-05 ENCOUNTER — Ambulatory Visit (INDEPENDENT_AMBULATORY_CARE_PROVIDER_SITE_OTHER): Payer: Medicare HMO

## 2023-01-05 ENCOUNTER — Ambulatory Visit (INDEPENDENT_AMBULATORY_CARE_PROVIDER_SITE_OTHER): Payer: Medicare HMO | Admitting: Orthopedic Surgery

## 2023-01-05 ENCOUNTER — Ambulatory Visit: Payer: Medicare HMO | Admitting: Orthopedic Surgery

## 2023-01-05 ENCOUNTER — Ambulatory Visit: Payer: Self-pay

## 2023-01-05 ENCOUNTER — Encounter: Payer: Self-pay | Admitting: Orthopedic Surgery

## 2023-01-05 DIAGNOSIS — M79675 Pain in left toe(s): Secondary | ICD-10-CM

## 2023-01-05 DIAGNOSIS — M25562 Pain in left knee: Secondary | ICD-10-CM | POA: Diagnosis not present

## 2023-01-05 DIAGNOSIS — M23004 Cystic meniscus, unspecified medial meniscus, left knee: Secondary | ICD-10-CM

## 2023-01-05 NOTE — Progress Notes (Unsigned)
Office Visit Note   Patient: Carlos David           Date of Birth: 1946-06-03           MRN: [id number] Visit Date: 01/05/2023 Requested by: Vivi Barrack, MD [address] [place],  [place] [address] PCP: Vivi Barrack, MD  Subjective: Chief Complaint  Patient presents with   Left Knee - Pain    HPI: Carlos David is a 77 y.o. male who presents to the office reporting left knee pain.  Radiographs demonstrate no significant arthritis in the left knee compared to prior radiographs.  Decision point today was for or against arthroscopic intervention.  He does have a meniscal tear which does not appear to be displaced.  Has a meniscal cyst associated with that tear.  Did have an injection on 12/17/2022 which gave him very minimal relief.  Has good and bad days.  Describes some catching and locking.  He states that the injection helped some but "did not solve the problem".  Uses a brace which helps.  Pain is very focal and medial..                ROS: All systems reviewed are negative as they relate to the chief complaint within the history of present illness.  Patient denies fevers or chills.  Assessment & Plan: Visit Diagnoses:  1. Left knee pain, unspecified chronicity     Plan: Impression is left knee medial meniscal tear which is palpable at the joint line.  No effusion in the knee joint today.  We can hold off on arthroscopic intervention for now.  Cyst is aspirated and injected under ultrasound guidance.  Will see him back in about 6 weeks for clinical recheck and final decision for or against arthroscopic cyst debridement and partial medial meniscectomy.  Follow-Up Instructions: No follow-ups on file.   Orders:  Orders Placed This Encounter  Procedures   XR Knee 1-2 Views Left   US Guided Needle Placement - No Linked Charges   No orders of the defined types were placed in this encounter.     Procedures: Large Joint Inj: L knee on 01/05/2023 9:38 PM Indications:  diagnostic evaluation, joint swelling and pain Details: 18 G 1.5 in needle, ultrasound-guided medial approach  Arthrogram: No  Medications: 5 mL lidocaine 1 %; 40 mg methylPREDNISolone acetate 40 MG/ML; 4 mL bupivacaine 0.25 % Outcome: tolerated well, no immediate complications Procedure, treatment alternatives, risks and benefits explained, specific risks discussed. Consent was given by the patient. Immediately prior to procedure a time out was called to verify the correct patient, procedure, equipment, support staff and site/side marked as required. Patient was prepped and draped in the usual sterile fashion.       Clinical Data: No additional findings.  Objective: Vital Signs: There were no vitals taken for this visit.  Physical Exam:  Constitutional: Patient appears well-developed HEENT:  Head: Normocephalic Eyes:EOM are normal Neck: Normal range of motion Cardiovascular: Normal rate Pulmonary/chest: Effort normal Neurologic: Patient is alert Skin: Skin is warm Psychiatric: Patient has normal mood and affect  Ortho Exam: Ortho exam demonstrates full range of motion of that left knee.  Has medial greater than lateral joint line tenderness.  Pedal pulses palpable.  No masses lymphadenopathy or skin changes noted in that left knee region.  Specifically I do not feel a Baker's cyst.  He does have a small palpable cystic structure measuring about 1 x 1/2 cm at the joint  line anteromedially.  Specialty Comments:  No specialty comments available.  Imaging: No results found.   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 12/17/2018   Erectile dysfunction 12/17/2018   Neck pain 09/15/2018   Lumbar radiculopathy 10/14/2016   BPH s/p TURP 2020 03/02/2014   Osteoarthritis  07/16/2012   Hypothyroidism s/p thyroidectomy 1990 due to thyroid cancer 12/26/2008   Dyslipidemia 12/26/2008   Past Medical History:  Diagnosis Date   Actinic keratosis 12/16/2010   Qualifier: Diagnosis of  By: [name]  MD, [name]    BPH (benign prostatic hyperplasia)    Cancer ([place])    thyroid cancer 1990   Cataract    both eyes   History of radiation therapy 1990   HYPERLIPIDEMIA 12/26/2008   Hypertension    HYPOTHYROIDISM 12/26/2008   PPD positive    Thoracic aortic aneurysm ([place])    THYROID CANCER, HX OF 1990   THYROID CANCER, HX OF 12/26/2008   Qualifier: Diagnosis of  By: [name]  MD, [name]     Family History  Problem Relation Age of Onset   Colon cancer Neg Hx     Past Surgical History:  Procedure Laterality Date   CATARACT EXTRACTION, BILATERAL  2020   COLONOSCOPY     10-ye ago-normal at [facility]   [place]     as child   HAND SURGERY     right hand trigger figger    INGUINAL HERNIA REPAIR Right 02/25/2022   Procedure: OPEN RIGHT INGUINAL HERNIA REPAIR WITH MESH;  Surgeon: Kinsinger, Arta Bruce, MD;  Location: [place];  Service: General;  Laterality: Right;   neckotomy  1990   20 lymph nodes removed from left neck   [place] N/A 01/21/2019   Procedure: TRANSURETHRAL RESECTION OF THE PROSTATE (TURP);  Surgeon: [name], MD;  Location: [facility];  Service: Urology;  Laterality: N/A;  26 MINS   Social History   Occupational History   Not on file  Tobacco Use   Smoking status: Former    Types: Cigarettes, Pipe    Quit date: 11/17/1968    Years since quitting: 54.1   Smokeless tobacco: Never  Vaping Use   Vaping Use: Never used  Substance and Sexual Activity   Alcohol use: Yes    Alcohol/week: 0.0 standard drinks of alcohol    Comment: occas. beer   Drug use: No   Sexual activity: Not on file

## 2023-01-06 ENCOUNTER — Encounter: Payer: Self-pay | Admitting: Orthopedic Surgery

## 2023-01-06 NOTE — Progress Notes (Signed)
Office Visit Note   Patient: Carlos David           Date of Birth: [birth year]           MRN: [id number] Visit Date: 01/05/2023              Requested by: Vivi Barrack, MD [address] [place],  [place] [address] PCP: Vivi Barrack, MD  Chief Complaint  Patient presents with   Left Foot - Nail Problem    Check great toenail      HPI: Patient is a 77 year old gentleman who is seen for evaluation for his left great toenail.  Patient feels like his toenail may be dead or infected.  Assessment & Plan: Visit Diagnoses:  1. 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  He will continue his fungal nail topical ointment formula 7.  Follow-Up Instructions: No follow-ups on file.   Ortho Exam  Patient is alert, oriented, no adenopathy, well-dressed, normal affect, normal respiratory effort. Examination patient has a strong dorsalis pedis pulse.  He has fungal nail changes of both great toenails.  The nail was trimmed there was no signs of infection no paronychial infection.  Patient states that his lateral foot pain is much improved after modifying the orthotics to allow for plantarflexion of the first ray.  Imaging: No results found. No images are attached to the encounter.  Labs: Lab Results  Component Value Date   HGBA1C 6.2 07/16/2022   HGBA1C 6.1 07/10/2021     Lab Results  Component Value Date   ALBUMIN 4.2 07/16/2022   ALBUMIN 4.2 07/10/2021   ALBUMIN 4.3 01/18/2020    No results found for: "MG" No results found for: "VD25OH"  No results found for: "PREALBUMIN"    Latest Ref Rng & Units 07/16/2022    8:46 AM 07/10/2021   10:31 AM 01/18/2020    2:43 PM  CBC EXTENDED  WBC 4.0 - 10.5 K/uL 6.2  6.2  7.8   RBC 4.22 - 5.81 Mil/uL 4.75  4.46  4.74   Hemoglobin 13.0 - 17.0 g/dL 14.6  13.6  14.3   HCT 39.0 - 52.0 % 43.7  40.1  41.9   Platelets 150.0 - 400.0 K/uL 168.0  164.0  194.0   NEUT# 1.4 -  7.7 K/uL   4.8   Lymph# 0.7 - 4.0 K/uL   2.0      There is no height or weight on file to calculate BMI.  Orders:  No orders of the defined types were placed in this encounter.  No orders of the defined types were placed in this encounter.    Procedures: No procedures performed  Clinical Data: No additional findings.  ROS:  All other systems negative, except as noted in the HPI. Review of Systems  Objective: Vital Signs: There were no vitals taken for this visit.  Specialty Comments:  No specialty comments available.  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 12/26/2008   Dyslipidemia 12/26/2008   Past Medical History:  Diagnosis Date   Actinic keratosis 12/16/2010   Qualifier: Diagnosis of  By: [name]  MD, [name]    BPH (benign prostatic hyperplasia)    Cancer [facility])    thyroid cancer 1990   Cataract    both eyes   History of radiation therapy 1990   HYPERLIPIDEMIA 12/26/2008   Hypertension    HYPOTHYROIDISM 12/26/2008   PPD positive    Thoracic aortic aneurysm ([place])    THYROID CANCER, HX OF 1990   THYROID CANCER, HX OF 12/26/2008   Qualifier: Diagnosis of  By: [name]  MD, [name]     Family History  Problem Relation Age of Onset   Colon cancer Neg Hx     Past Surgical History:  Procedure Laterality Date   CATARACT EXTRACTION, BILATERAL  2020   COLONOSCOPY     10-ye ago-normal at [facility]   [place]     as child   HAND SURGERY     right hand trigger figger    INGUINAL HERNIA REPAIR Right 02/25/2022   Procedure: OPEN  RIGHT INGUINAL HERNIA REPAIR WITH MESH;  Surgeon: Kinsinger, Arta Bruce, MD;  Location: [place];  Service: General;  Laterality: Right;   neckotomy  1990   20 lymph nodes removed from left neck   TOTAL THYROIDECTOMY  1990   TRANSURETHRAL RESECTION OF PROSTATE N/A 01/21/2019   Procedure: TRANSURETHRAL RESECTION OF THE PROSTATE (TURP);  Surgeon: [name], MD;  Location: [facility];  Service: Urology;  Laterality: N/A;  78 MINS   Social History   Occupational History   Not on file  Tobacco Use   Smoking status: Former    Types: Cigarettes, Pipe    Quit date: 11/17/1968    Years since quitting: 54.1   Smokeless tobacco: Never  Vaping Use   Vaping Use: Never used  Substance and Sexual Activity   Alcohol use: Yes    Alcohol/week: 0.0 standard drinks of alcohol    Comment: occas. beer   Drug use: No   Sexual activity: Not on file

## 2023-01-07 ENCOUNTER — Telehealth: Payer: Self-pay

## 2023-01-07 ENCOUNTER — Telehealth: Payer: Self-pay | Admitting: Orthopedic Surgery

## 2023-01-07 MED ORDER — LIDOCAINE HCL 1 % IJ SOLN
5.0000 mL | INTRAMUSCULAR | Status: AC | PRN
  Administered 2023-01-05: 5 mL

## 2023-01-07 MED ORDER — METHYLPREDNISOLONE ACETATE 40 MG/ML IJ SUSP
40.0000 mg | INTRAMUSCULAR | Status: AC | PRN
  Administered 2023-01-05: 40 mg via INTRA_ARTICULAR

## 2023-01-07 MED ORDER — BUPIVACAINE HCL 0.25 % IJ SOLN
4.0000 mL | INTRAMUSCULAR | Status: AC | PRN
  Administered 2023-01-05: 4 mL via INTRA_ARTICULAR

## 2023-01-07 NOTE — Telephone Encounter (Signed)
Pls make sure has 6wk f/u from last appointment.

## 2023-01-07 NOTE — Telephone Encounter (Signed)
Called patient left message to return call to reschedule his appointment 6 weeks out with Dr. Marlou Sa instead of 4 weeks out per Ander Purpura

## 2023-01-07 NOTE — Telephone Encounter (Signed)
-----   Message from Meredith Pel, MD sent at 01/07/2023 12:11 PM EST ----- Hi Arita Severtson can you have this guy come back in 6 weeks for clinical recheck and decision for or against arthroscopic treatment.  Thanks

## 2023-01-09 ENCOUNTER — Encounter: Payer: Self-pay | Admitting: *Deleted

## 2023-01-12 DIAGNOSIS — M48061 Spinal stenosis, lumbar region without neurogenic claudication: Secondary | ICD-10-CM | POA: Diagnosis not present

## 2023-01-12 DIAGNOSIS — M47816 Spondylosis without myelopathy or radiculopathy, lumbar region: Secondary | ICD-10-CM | POA: Diagnosis not present

## 2023-01-14 ENCOUNTER — Ambulatory Visit: Payer: Medicare HMO | Admitting: Orthopedic Surgery

## 2023-01-26 ENCOUNTER — Ambulatory Visit: Payer: Medicare HMO | Admitting: Orthopedic Surgery

## 2023-01-26 ENCOUNTER — Encounter: Payer: Self-pay | Admitting: Orthopedic Surgery

## 2023-01-26 DIAGNOSIS — M23004 Cystic meniscus, unspecified medial meniscus, left knee: Secondary | ICD-10-CM

## 2023-01-26 NOTE — Progress Notes (Signed)
Office Visit Note   Patient: Carlos David           Date of Birth: 1946-05-31           MRN: [id number] Visit Date: 01/26/2023 Requested by: Vivi Barrack, MD [address] [place],  [place] [address] PCP: Vivi Barrack, MD  Subjective: Chief Complaint  Patient presents with   Left Knee - Follow-up    HPI: Carlos David is a 77 y.o. male who presents to the office reporting improvement in left knee pain.  When he was last seen he had left knee aspiration and injection on 01/05/2023 along with aspiration of a medial knee cyst.  Overall he is feeling better.  Has good and bad days.  Overall he is about 75% better.  Uses a brace for support.  Pain does not wake him from sleep anymore..                ROS: All systems reviewed are negative as they relate to the chief complaint within the history of present illness.  Patient denies fevers or chills.  Assessment & Plan: Visit Diagnoses:  1. Cyst of medial meniscus of left knee     Plan: Impression is improvement in left knee pain with only trace effusion at this time.  Cyst decompression and aspiration was helpful along with knee aspiration.  At this time we will hold off on any surgical intervention.  If he has recurrent symptoms he should come back but at this time he feels like he will be able to do his spring planting.  Follow-Up Instructions: No follow-ups on file.   Orders:  No orders of the defined types were placed in this encounter.  No orders of the defined types were placed in this encounter.     Procedures: No procedures performed   Clinical Data: No additional findings.  Objective: Vital Signs: There were no vitals taken for this visit.  Physical Exam:  Constitutional: Patient appears well-developed HEENT:  Head: Normocephalic Eyes:EOM are normal Neck: Normal range of motion Cardiovascular: Normal rate Pulmonary/chest: Effort normal Neurologic: Patient is alert Skin: Skin is warm Psychiatric: Patient  has normal mood and affect  Ortho Exam: Ortho exam demonstrates full range of motion of that left knee.  Trace effusion.  Intact extensor mechanism.  No focal joint line tenderness to palpation but he does occasionally get episodic sharp pain around that mid medial joint line.  Cystic structure feels less prominent now than it did a month ago.  Specialty Comments:  No specialty comments available.  Imaging: No results found.   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 12/26/2008   Dyslipidemia 12/26/2008   Past Medical History:  Diagnosis Date   Actinic keratosis 12/16/2010   Qualifier: Diagnosis of  By: [name]  MD, [name]    BPH (benign prostatic hyperplasia)    Cancer ([place])    thyroid cancer 1990   Cataract    both eyes   History of radiation therapy 1990   HYPERLIPIDEMIA 12/26/2008   Hypertension    HYPOTHYROIDISM 12/26/2008  PPD positive    Thoracic aortic aneurysm ([place])    THYROID CANCER, HX OF 1990   THYROID CANCER, HX OF 12/26/2008   Qualifier: Diagnosis of  By: [name]  MD, [name]     Family History  Problem Relation Age of Onset   Colon cancer Neg Hx     Past Surgical History:  Procedure Laterality Date   CATARACT EXTRACTION, BILATERAL  2020   COLONOSCOPY     10-ye ago-normal at [facility]   [place]     as child   HAND SURGERY     right hand trigger figger    INGUINAL HERNIA REPAIR Right 02/25/2022   Procedure: OPEN RIGHT INGUINAL HERNIA REPAIR WITH MESH;  Surgeon: Kinsinger, Arta Bruce, MD;  Location: [facility];  Service: General;  Laterality: Right;   neckotomy  1990   20 lymph nodes removed from left neck   TOTAL THYROIDECTOMY  1990   TRANSURETHRAL RESECTION OF PROSTATE N/A 01/21/2019   Procedure: TRANSURETHRAL RESECTION OF THE PROSTATE (TURP);  Surgeon: [name], MD;  Location: [facility];  Service: Urology;  Laterality: N/A;  54 MINS   Social History   Occupational History   Not on file  Tobacco Use   Smoking status: Former    Types: Cigarettes, Pipe    Quit date: 11/17/1968    Years since quitting: 54.2   Smokeless tobacco: Never  Vaping Use   Vaping Use: Never used  Substance and Sexual Activity   Alcohol use: Yes    Alcohol/week: 0.0 standard drinks of alcohol    Comment: occas. beer   Drug use: No   Sexual activity: Not on file

## 2023-02-03 DIAGNOSIS — M47816 Spondylosis without myelopathy or radiculopathy, lumbar region: Secondary | ICD-10-CM | POA: Diagnosis not present

## 2023-02-10 ENCOUNTER — Ambulatory Visit: Payer: Medicare HMO | Admitting: Podiatry

## 2023-02-23 ENCOUNTER — Encounter: Payer: Self-pay | Admitting: Family Medicine

## 2023-02-24 ENCOUNTER — Telehealth: Payer: Self-pay | Admitting: Family Medicine

## 2023-02-24 NOTE — Telephone Encounter (Signed)
Contacted Carlos David to schedule their annual wellness visit. Appointment made for 03/03/2023.  Carlos David [facility] AWV TEAM Direct Dial [phone]

## 2023-02-24 NOTE — Telephone Encounter (Signed)
Recommend he come in for office visit to discuss labs including B12 before we start a B12 shot.  Katina Degree. Jimmey Ralph, MD 02/24/2023 12:59 PM

## 2023-02-24 NOTE — Telephone Encounter (Signed)
Please advise 

## 2023-03-03 ENCOUNTER — Ambulatory Visit (INDEPENDENT_AMBULATORY_CARE_PROVIDER_SITE_OTHER): Payer: Medicare HMO

## 2023-03-03 VITALS — Wt 195.0 lb

## 2023-03-03 DIAGNOSIS — Z0000 Encounter for general adult medical examination without abnormal findings: Secondary | ICD-10-CM

## 2023-03-03 NOTE — Patient Instructions (Signed)
[name] , Thank you for taking time to come for your Medicare Wellness Visit. I appreciate your ongoing commitment to your health goals. Please review the following plan we discussed and let me know if I can assist you in the future.   These are the goals we discussed:  Goals      Blood Pressure < 140/90     Patient Stated     Lose weight and keep active      Patient Stated     Lose a little more weight      Patient Stated     Lose weight      Track and Manage My Blood Pressure-Hypertension     Timeframe:  Long-Range Goal Priority:  High Start Date: 07/16/21                            Expected End Date:    02/30/23                   Follow Up Date 10/16/21    - check blood pressure weekly - choose a place to take my blood pressure (home, clinic or office, retail store) - write blood pressure results in a log or diary    Why is this important?   You won't feel high blood pressure, but it can still hurt your blood vessels.  High blood pressure can cause heart or kidney problems. It can also cause a stroke.  Making lifestyle changes like losing a little weight or eating less salt will help.  Checking your blood pressure at home and at different times of the day can help to control blood pressure.  If the doctor prescribes medicine remember to take it the way the doctor ordered.  Call the office if you cannot afford the medicine or if there are questions about it.     Notes:         This is a list of the screening recommended for you and due dates:  Health Maintenance  Topic Date Due   DTaP/Tdap/Td vaccine (1 - Tdap) 09/21/2014   COVID-19 Vaccine (4 - 2023-24 season) 07/18/2022   Colon Cancer Screening  07/11/2023*   Medicare Annual Wellness Visit  03/02/2024   Pneumonia Vaccine  Completed   Hepatitis C Screening: USPSTF Recommendation to screen - Ages 18-79 yo.  Completed   HPV Vaccine  Aged Out   Flu Shot  Discontinued   Zoster (Shingles) Vaccine  Discontinued   *Topic was postponed. The date shown is not the original due date.    Advanced directives: Please bring a copy of your health care power of attorney and living will to the office at your convenience.  Conditions/risks identified: lose weight   Next appointment: Follow up in one year for your annual wellness visit.   Preventive Care 73 Years and Older, Male  Preventive care refers to lifestyle choices and visits with your health care provider that can promote health and wellness. What does preventive care include? A yearly physical exam. This is also called an annual well check. Dental exams once or twice a year. Routine eye exams. Ask your health care provider how often you should have your eyes checked. Personal lifestyle choices, including: Daily care of your teeth and gums. Regular physical activity. Eating a healthy diet. Avoiding tobacco and drug use. Limiting alcohol use. Practicing safe sex. Taking low doses of aspirin every day. Taking vitamin and mineral supplements  as recommended by your health care provider. What happens during an annual well check? The services and screenings done by your health care provider during your annual well check will depend on your age, overall health, lifestyle risk factors, and family history of disease. Counseling  Your health care provider may ask you questions about your: Alcohol use. Tobacco use. Drug use. Emotional well-being. Home and relationship well-being. Sexual activity. Eating habits. History of falls. Memory and ability to understand (cognition). Work and work Astronomer. Screening  You may have the following tests or measurements: Height, weight, and BMI. Blood pressure. Lipid and cholesterol levels. These may be checked every 5 years, or more frequently if you are over 6 years old. Skin check. Lung cancer screening. You may have this screening every year starting at age 39 if you have a 30-pack-year history of  smoking and currently smoke or have quit within the past 15 years. Fecal occult blood test (FOBT) of the stool. You may have this test every year starting at age 56. Flexible sigmoidoscopy or colonoscopy. You may have a sigmoidoscopy every 5 years or a colonoscopy every 10 years starting at age 36. Prostate cancer screening. Recommendations will vary depending on your family history and other risks. Hepatitis C blood test. Hepatitis B blood test. Sexually transmitted disease (STD) testing. Diabetes screening. This is done by checking your blood sugar (glucose) after you have not eaten for a while (fasting). You may have this done every 1-3 years. Abdominal aortic aneurysm (AAA) screening. You may need this if you are a current or former smoker. Osteoporosis. You may be screened starting at age 16 if you are at high risk. Talk with your health care provider about your test results, treatment options, and if necessary, the need for more tests. Vaccines  Your health care provider may recommend certain vaccines, such as: Influenza vaccine. This is recommended every year. Tetanus, diphtheria, and acellular pertussis (Tdap, Td) vaccine. You may need a Td booster every 10 years. Zoster vaccine. You may need this after age 56. Pneumococcal 13-valent conjugate (PCV13) vaccine. One dose is recommended after age 44. Pneumococcal polysaccharide (PPSV23) vaccine. One dose is recommended after age 35. Talk to your health care provider about which screenings and vaccines you need and how often you need them. This information is not intended to replace advice given to you by your health care provider. Make sure you discuss any questions you have with your health care provider. Document Released: 11/30/2015 Document Revised: 07/23/2016 Document Reviewed: 09/04/2015 Elsevier Interactive Patient Education  2017 [employer].  Fall Prevention in the Home Falls can cause injuries. They can happen to people of  all ages. There are many things you can do to make your home safe and to help prevent falls. What can I do on the outside of my home? Regularly fix the edges of walkways and driveways and fix any cracks. Remove anything that might make you trip as you walk through a door, such as a raised step or threshold. Trim any bushes or trees on the path to your home. Use bright outdoor lighting. Clear any walking paths of anything that might make someone trip, such as rocks or tools. Regularly check to see if handrails are loose or broken. Make sure that both sides of any steps have handrails. Any raised decks and porches should have guardrails on the edges. Have any leaves, snow, or ice cleared regularly. Use sand or salt on walking paths during winter. Clean up any spills  in your garage right away. This includes oil or grease spills. What can I do in the bathroom? Use night lights. Install grab bars by the toilet and in the tub and shower. Do not use towel bars as grab bars. Use non-skid mats or decals in the tub or shower. If you need to sit down in the shower, use a plastic, non-slip stool. Keep the floor dry. Clean up any water that spills on the floor as soon as it happens. Remove soap buildup in the tub or shower regularly. Attach bath mats securely with double-sided non-slip rug tape. Do not have throw rugs and other things on the floor that can make you trip. What can I do in the bedroom? Use night lights. Make sure that you have a light by your bed that is easy to reach. Do not use any sheets or blankets that are too big for your bed. They should not hang down onto the floor. Have a firm chair that has side arms. You can use this for support while you get dressed. Do not have throw rugs and other things on the floor that can make you trip. What can I do in the kitchen? Clean up any spills right away. Avoid walking on wet floors. Keep items that you use a lot in easy-to-reach places. If  you need to reach something above you, use a strong step stool that has a grab bar. Keep electrical cords out of the way. Do not use floor polish or wax that makes floors slippery. If you must use wax, use non-skid floor wax. Do not have throw rugs and other things on the floor that can make you trip. What can I do with my stairs? Do not leave any items on the stairs. Make sure that there are handrails on both sides of the stairs and use them. Fix handrails that are broken or loose. Make sure that handrails are as long as the stairways. Check any carpeting to make sure that it is firmly attached to the stairs. Fix any carpet that is loose or worn. Avoid having throw rugs at the top or bottom of the stairs. If you do have throw rugs, attach them to the floor with carpet tape. Make sure that you have a light switch at the top of the stairs and the bottom of the stairs. If you do not have them, ask someone to add them for you. What else can I do to help prevent falls? Wear shoes that: Do not have high heels. Have rubber bottoms. Are comfortable and fit you well. Are closed at the toe. Do not wear sandals. If you use a stepladder: Make sure that it is fully opened. Do not climb a closed stepladder. Make sure that both sides of the stepladder are locked into place. Ask someone to hold it for you, if possible. Clearly mark and make sure that you can see: Any grab bars or handrails. First and last steps. Where the edge of each step is. Use tools that help you move around (mobility aids) if they are needed. These include: Canes. Walkers. Scooters. Crutches. Turn on the lights when you go into a dark area. Replace any light bulbs as soon as they burn out. Set up your furniture so you have a clear path. Avoid moving your furniture around. If any of your floors are uneven, fix them. If there are any pets around you, be aware of where they are. Review your medicines with your doctor. Some  medicines can make you feel dizzy. This can increase your chance of falling. Ask your doctor what other things that you can do to help prevent falls. This information is not intended to replace advice given to you by your health care provider. Make sure you discuss any questions you have with your health care provider. Document Released: 08/30/2009 Document Revised: 04/10/2016 Document Reviewed: 12/08/2014 Elsevier Interactive Patient Education  2017 [employer].

## 2023-03-03 NOTE — Progress Notes (Signed)
Subjective:   Carlos David is a 77 y.o. male who presents for Medicare Annual/Subsequent preventive examination.  Review of Systems     Cardiac Risk Factors include: advanced age (>52men, >75 women);male gender;dyslipidemia;hypertension     Objective:    Today's Vitals   03/03/23 1534  Weight: 195 lb (88.5 kg)   Body mass index is 27.98 kg/m.     03/03/2023    3:40 PM 02/28/2022    1:11 PM 02/25/2022   10:21 AM 02/22/2021   11:59 AM 07/06/2019    9:17 AM 09/06/2015    3:17 PM 02/28/2014   10:51 AM  Advanced Directives  Does Patient Have a Medical Advance Directive? Yes Yes Yes Yes Yes Yes Patient does not have advance directive  Type of Advance Directive Healthcare Power of [place];Living will Living will Healthcare Power of [place];Living will Living will Healthcare Power of [place];Living will Healthcare Power of [place];Living will   Does patient want to make changes to medical advance directive?     No - Patient declined    Copy of Healthcare Power of Attorney in Chart? No - copy requested No - copy requested No - copy requested  No - copy requested      Current Medications (verified) Outpatient Encounter Medications as of 03/03/2023  Medication Sig   aspirin EC 81 MG tablet Take 1 tablet (81 mg total) by mouth daily. Swallow whole.   atorvastatin (LIPITOR) 40 MG tablet Take 1 tablet (40 mg total) by mouth daily.   ezetimibe (ZETIA) 10 MG tablet Take 1 tablet (10 mg total) by mouth daily.   finasteride (PROSCAR) 5 MG tablet TAKE 1 TABLET BY MOUTH EVERY DAY   gabapentin (NEURONTIN) 300 MG capsule Take 1 capsule by mouth 2 (two) times daily.   hydrocortisone 2.5 % cream    levothyroxine (SYNTHROID) 137 MCG tablet TAKE 1 TABLET BY MOUTH EVERY DAY   losartan (COZAAR) 100 MG tablet TAKE 1 TABLET BY MOUTH EVERY DAY   meloxicam (MOBIC) 15 MG tablet Take 1 tablet (15 mg total) by mouth as needed.   No facility-administered encounter medications on file as of 03/03/2023.     Allergies (verified) Penicillins   History: Past Medical History:  Diagnosis Date   Actinic keratosis 12/16/2010   Qualifier: Diagnosis of  By: Amador Cunas  MD, Janett Labella    BPH (benign prostatic hyperplasia)    Cancer    thyroid cancer 1990   Cataract    both eyes   History of radiation therapy 1990   HYPERLIPIDEMIA 12/26/2008   Hypertension    HYPOTHYROIDISM 12/26/2008   PPD positive    Thoracic aortic aneurysm    THYROID CANCER, HX OF 1990   THYROID CANCER, HX OF 12/26/2008   Qualifier: Diagnosis of  By: Amador Cunas  MD, Janett Labella    Past Surgical History:  Procedure Laterality Date   CATARACT EXTRACTION, BILATERAL  2020   COLONOSCOPY     10-ye ago-normal at [facility]   EYE SURGERY     as child   HAND SURGERY     right hand trigger figger    INGUINAL HERNIA REPAIR Right 02/25/2022   Procedure: OPEN RIGHT INGUINAL HERNIA REPAIR WITH MESH;  Surgeon: Kinsinger, De Blanch, MD;  Location: [facility];  Service: General;  Laterality: Right;   neckotomy  1990   20 lymph nodes removed from left neck   TOTAL THYROIDECTOMY  1990   TRANSURETHRAL RESECTION OF PROSTATE N/A 01/21/2019   Procedure: TRANSURETHRAL  RESECTION OF THE PROSTATE (TURP);  Surgeon: Sebastian Ache, MD;  Location: [facility];  Service: Urology;  Laterality: N/A;  38 MINS   Family History  Problem Relation Age of Onset   Colon cancer Neg Hx    Social History   Socioeconomic History   Marital status: Married    Spouse name: Not on file   Number of children: Not on file   Years of education: Not on file   Highest education level: Not on file  Occupational History   Not on file  Tobacco Use   Smoking status: Former    Types: Cigarettes, Pipe    Quit date: 11/17/1968    Years since quitting: 54.3   Smokeless tobacco: Never  Vaping Use   Vaping Use: Never used  Substance and Sexual Activity   Alcohol use: Yes    Alcohol/week: 0.0 standard drinks of alcohol    Comment:  occas. beer   Drug use: No   Sexual activity: Not on file  Other Topics Concern   Not on file  Social History Narrative   Not on file   Social Determinants of Health   Financial Resource Strain: Low Risk  (03/03/2023)   Overall Financial Resource Strain (CARDIA)    Difficulty of Paying Living Expenses: Not hard at all  Food Insecurity: No Food Insecurity (03/03/2023)   Hunger Vital Sign    Worried About Running Out of Food in the Last Year: Never true    Ran Out of Food in the Last Year: Never true  Transportation Needs: No Transportation Needs (03/03/2023)   PRAPARE - Administrator, Civil Service (Medical): No    Lack of Transportation (Non-Medical): No  Physical Activity: Inactive (03/03/2023)   Exercise Vital Sign    Days of Exercise per Week: 0 days    Minutes of Exercise per Session: 0 min  Stress: No Stress Concern Present (03/03/2023)   [employer] of Occupational Health - Occupational Stress Questionnaire    Feeling of Stress : Not at all  Social Connections: Moderately Integrated (03/03/2023)   Social Connection and Isolation Panel [NHANES]    Frequency of Communication with Friends and Family: More than three times a week    Frequency of Social Gatherings with Friends and Family: More than three times a week    Attends Religious Services: More than 4 times per year    Active Member of [employer] or Organizations: No    Attends Engineer, structural: Never    Marital Status: Married    Tobacco Counseling Counseling given: Not Answered   Clinical Intake:  Pre-visit preparation completed: Yes  Pain : No/denies pain     BMI - recorded: 27.98 Nutritional Status: BMI 25 -29 Overweight Nutritional Risks: None Diabetes: No  How often do you need to have someone help you when you read instructions, pamphlets, or other written materials from your doctor or pharmacy?: 1 - Never  Diabetic?no  Interpreter Needed?: No  Information entered by ::  Lanier Ensign, LPN   Activities of Daily Living    03/03/2023    3:42 PM  In your present state of health, do you have any difficulty performing the following activities:  Hearing? 0  Vision? 0  Difficulty concentrating or making decisions? 0  Walking or climbing stairs? 0  Dressing or bathing? 0  Doing errands, shopping? 0  Preparing Food and eating ? N  Using the Toilet? N  In the past six months, have  you accidently leaked urine? N  Do you have problems with loss of bowel control? N  Managing your Medications? N  Managing your Finances? N  Housekeeping or managing your Housekeeping? N    Patient Care Team: Ardith Dark, MD as PCP - General (Family Medicine) Little Ishikawa, MD as PCP - Cardiology (Cardiology) Berneice Heinrich Delbert Phenix., MD as Consulting Physician (Urology) Callie Fielding, MD as Consulting Physician (Physical Medicine and Rehabilitation) Jethro Bolus, MD as Consulting Physician (Ophthalmology) Lisbeth Renshaw, MD as Consulting Physician (Neurosurgery) Erroll Luna, [facility] as Pharmacist (Pharmacist)  Indicate any recent Medical Services you may have received from other than [facility] providers in the past year (date may be approximate).     Assessment:   This is a routine wellness examination for [place].  Hearing/Vision screen Hearing Screening - Comments:: Pt denies any hearing  issues  Vision Screening - Comments:: Pt follows up with Dr Nile Riggs   Dietary issues and exercise activities discussed: Current Exercise Habits: The patient does not participate in regular exercise at present   Goals Addressed             This Visit's Progress    Patient Stated       Lose weight        Depression Screen    03/03/2023    3:39 PM 08/08/2022    9:05 AM 07/16/2022    7:59 AM 02/28/2022    1:09 PM 07/10/2021    9:33 AM 02/22/2021   11:57 AM 07/06/2019    8:14 AM  PHQ 2/9 Scores  PHQ - 2 Score 0 0 0 0 0 0 0    Fall Risk    03/03/2023    3:41  PM 08/08/2022    9:05 AM 07/16/2022    7:58 AM 02/28/2022    1:12 PM 11/06/2021   10:07 AM  Fall Risk   Falls in the past year? 0 0 0 0 0  Number falls in past yr: 0 0 0 0   Injury with Fall? 0 0 0 0   Risk for fall due to : Impaired balance/gait;Impaired vision No Fall Risks No Fall Risks Impaired vision   Follow up Falls prevention discussed  Falls evaluation completed Falls prevention discussed     FALL RISK PREVENTION PERTAINING TO THE HOME:  Any stairs in or around the home? No  If so, are there any without handrails? No  Home free of loose throw rugs in walkways, pet beds, electrical cords, etc? Yes  Adequate lighting in your home to reduce risk of falls? Yes   ASSISTIVE DEVICES UTILIZED TO PREVENT FALLS:  Life alert? No  Use of a cane, walker or w/c? No  Grab bars in the bathroom? Yes  Shower chair or bench in shower? No  Elevated toilet seat or a handicapped toilet? No   TIMED UP AND GO:  Was the test performed? No .   Cognitive Function:        03/03/2023    3:42 PM 02/28/2022    1:13 PM 02/22/2021   12:03 PM  6CIT Screen  What Year? 0 points 0 points 0 points  What month? 0 points 0 points 0 points  What time? 0 points 0 points   Count back from 20 0 points 0 points 0 points  Months in reverse 0 points 0 points 0 points  Repeat phrase 0 points 0 points 0 points  Total Score 0 points 0 points     Immunizations  Immunization History  Administered Date(s) Administered   Hep A / Hep B 05/24/2012   Hepatitis B 06/24/2012, 12/27/2012   Influenza,inj,Quad PF,6+ Mos 11/24/2013   PFIZER(Purple Top)SARS-COV-2 Vaccination 12/25/2019, 01/19/2020, 09/30/2020   Pneumococcal Conjugate-13 03/09/2014   Pneumococcal Polysaccharide-23 02/17/2012   Tetanus 09/20/2014    TDAP status: Due, Education has been provided regarding the importance of this vaccine. Advised may receive this vaccine at local pharmacy or Health Dept. Aware to provide a copy of the vaccination record  if obtained from local pharmacy or Health Dept. Verbalized acceptance and understanding.  Flu Vaccine status: Declined, Education has been provided regarding the importance of this vaccine but patient still declined. Advised may receive this vaccine at local pharmacy or Health Dept. Aware to provide a copy of the vaccination record if obtained from local pharmacy or Health Dept. Verbalized acceptance and understanding.  Pneumococcal vaccine status: Up to date  Covid-19 vaccine status: Completed vaccines  Qualifies for Shingles Vaccine? No     Screening Tests Health Maintenance  Topic Date Due   DTaP/Tdap/Td (1 - Tdap) 09/21/2014   COVID-19 Vaccine (4 - 2023-24 season) 07/18/2022   COLONOSCOPY (Pts 45-4yrs Insurance coverage will need to be confirmed)  07/11/2023 (Originally 10/01/2020)   Medicare Annual Wellness (AWV)  03/02/2024   Pneumonia Vaccine 43+ Years old  Completed   Hepatitis C Screening  Completed   HPV VACCINES  Aged Out   INFLUENZA VACCINE  Discontinued   Zoster Vaccines- Shingrix  Discontinued    Health Maintenance  Health Maintenance Due  Topic Date Due   DTaP/Tdap/Td (1 - Tdap) 09/21/2014   COVID-19 Vaccine (4 - 2023-24 season) 07/18/2022    Colorectal cancer screening: Type of screening: Colonoscopy. Completed 10/02/15. Repeat every 5 years    Additional Screening:  Hepatitis C Screening:  Completed 07/06/19  Vision Screening: Recommended annual ophthalmology exams for early detection of glaucoma and other disorders of the eye. Is the patient up to date with their annual eye exam?  Yes  Who is the provider or what is the name of the office in which the patient attends annual eye exams? Dr Nile Riggs  If pt is not established with a provider, would they like to be referred to a provider to establish care? No .   Dental Screening: Recommended annual dental exams for proper oral hygiene  Community Resource Referral / Chronic Care Management: CRR required  this visit?  No   CCM required this visit?  No      Plan:     I have personally reviewed and noted the following in the patient's chart:   Medical and social history Use of alcohol, tobacco or illicit drugs  Current medications and supplements including opioid prescriptions. Patient is not currently taking opioid prescriptions. Functional ability and status Nutritional status Physical activity Advanced directives List of other physicians Hospitalizations, surgeries, and ER visits in previous 12 months Vitals Screenings to include cognitive, depression, and falls Referrals and appointments  In addition, I have reviewed and discussed with patient certain preventive protocols, quality metrics, and best practice recommendations. A written personalized care plan for preventive services as well as general preventive health recommendations were provided to patient.     Marzella Schlein, LPN   [id number]/61/0960   Nurse Notes: none

## 2023-03-05 NOTE — Progress Notes (Signed)
I connected with  [name] on 03/05/23 by a audio enabled telemedicine application and verified that I am speaking with the correct person using two identifiers.  Patient Location: Home  Provider Location: Office/Clinic  I discussed the limitations of evaluation and management by telemedicine. The patient expressed understanding and agreed to proceed.   Subjective:   Carlos David is a 77 y.o. male who presents for Medicare Annual/Subsequent preventive examination.  Review of Systems     Cardiac Risk Factors include: advanced age (>84men, >47 women);male gender;dyslipidemia;hypertension     Objective:    Today's Vitals   03/03/23 1534  Weight: 195 lb (88.5 kg)   Body mass index is 27.98 kg/m.     03/03/2023    3:40 PM 02/28/2022    1:11 PM 02/25/2022   10:21 AM 02/22/2021   11:59 AM 07/06/2019    9:17 AM 09/06/2015    3:17 PM 02/28/2014   10:51 AM  Advanced Directives  Does Patient Have a Medical Advance Directive? Yes Yes Yes Yes Yes Yes Patient does not have advance directive  Type of Advance Directive Healthcare Power of [place];Living will Living will Healthcare Power of [place];Living will Living will Healthcare Power of [place];Living will Healthcare Power of [place];Living will   Does patient want to make changes to medical advance directive?     No - Patient declined    Copy of Healthcare Power of Attorney in Chart? No - copy requested No - copy requested No - copy requested  No - copy requested      Current Medications (verified) Outpatient Encounter Medications as of 03/03/2023  Medication Sig   aspirin EC 81 MG tablet Take 1 tablet (81 mg total) by mouth daily. Swallow whole.   atorvastatin (LIPITOR) 40 MG tablet Take 1 tablet (40 mg total) by mouth daily.   ezetimibe (ZETIA) 10 MG tablet Take 1 tablet (10 mg total) by mouth daily.   finasteride (PROSCAR) 5 MG tablet TAKE 1 TABLET BY MOUTH EVERY DAY   gabapentin (NEURONTIN) 300 MG capsule Take 1 capsule  by mouth 2 (two) times daily.   hydrocortisone 2.5 % cream    levothyroxine (SYNTHROID) 137 MCG tablet TAKE 1 TABLET BY MOUTH EVERY DAY   losartan (COZAAR) 100 MG tablet TAKE 1 TABLET BY MOUTH EVERY DAY   meloxicam (MOBIC) 15 MG tablet Take 1 tablet (15 mg total) by mouth as needed.   No facility-administered encounter medications on file as of 03/03/2023.    Allergies (verified) Penicillins   History: Past Medical History:  Diagnosis Date   Actinic keratosis 12/16/2010   Qualifier: Diagnosis of  By: Amador Cunas  MD, Janett Labella    BPH (benign prostatic hyperplasia)    Cancer    thyroid cancer 1990   Cataract    both eyes   History of radiation therapy 1990   HYPERLIPIDEMIA 12/26/2008   Hypertension    HYPOTHYROIDISM 12/26/2008   PPD positive    Thoracic aortic aneurysm    THYROID CANCER, HX OF 1990   THYROID CANCER, HX OF 12/26/2008   Qualifier: Diagnosis of  By: Amador Cunas  MD, Janett Labella    Past Surgical History:  Procedure Laterality Date   CATARACT EXTRACTION, BILATERAL  2020   COLONOSCOPY     10-ye ago-normal at [facility]   EYE SURGERY     as child   HAND SURGERY     right hand trigger figger    INGUINAL HERNIA REPAIR Right 02/25/2022   Procedure: OPEN  RIGHT INGUINAL HERNIA REPAIR WITH MESH;  Surgeon: Kinsinger, De Blanch, MD;  Location: [facility];  Service: General;  Laterality: Right;   neckotomy  1990   20 lymph nodes removed from left neck   TOTAL THYROIDECTOMY  1990   TRANSURETHRAL RESECTION OF PROSTATE N/A 01/21/2019   Procedure: TRANSURETHRAL RESECTION OF THE PROSTATE (TURP);  Surgeon: Sebastian Ache, MD;  Location: [facility];  Service: Urology;  Laterality: N/A;  89 MINS   Family History  Problem Relation Age of Onset   Colon cancer Neg Hx    Social History   Socioeconomic History   Marital status: Married    Spouse name: Not on file   Number of children: Not on file   Years of education: Not on file   Highest education  level: Not on file  Occupational History   Not on file  Tobacco Use   Smoking status: Former    Types: Cigarettes, Pipe    Quit date: 11/17/1968    Years since quitting: 54.3   Smokeless tobacco: Never  Vaping Use   Vaping Use: Never used  Substance and Sexual Activity   Alcohol use: Yes    Alcohol/week: 0.0 standard drinks of alcohol    Comment: occas. beer   Drug use: No   Sexual activity: Not on file  Other Topics Concern   Not on file  Social History Narrative   Not on file   Social Determinants of Health   Financial Resource Strain: Low Risk  (03/03/2023)   Overall Financial Resource Strain (CARDIA)    Difficulty of Paying Living Expenses: Not hard at all  Food Insecurity: No Food Insecurity (03/03/2023)   Hunger Vital Sign    Worried About Running Out of Food in the Last Year: Never true    Ran Out of Food in the Last Year: Never true  Transportation Needs: No Transportation Needs (03/03/2023)   PRAPARE - Administrator, Civil Service (Medical): No    Lack of Transportation (Non-Medical): No  Physical Activity: Inactive (03/03/2023)   Exercise Vital Sign    Days of Exercise per Week: 0 days    Minutes of Exercise per Session: 0 min  Stress: No Stress Concern Present (03/03/2023)   [employer] of Occupational Health - Occupational Stress Questionnaire    Feeling of Stress : Not at all  Social Connections: Moderately Integrated (03/03/2023)   Social Connection and Isolation Panel [NHANES]    Frequency of Communication with Friends and Family: More than three times a week    Frequency of Social Gatherings with Friends and Family: More than three times a week    Attends Religious Services: More than 4 times per year    Active Member of [employer] or Organizations: No    Attends Engineer, structural: Never    Marital Status: Married    Tobacco Counseling Counseling given: Not Answered   Clinical Intake:  Pre-visit preparation completed:  Yes  Pain : No/denies pain     BMI - recorded: 27.98 Nutritional Status: BMI 25 -29 Overweight Nutritional Risks: None Diabetes: No  How often do you need to have someone help you when you read instructions, pamphlets, or other written materials from your doctor or pharmacy?: 1 - Never  Diabetic?no  Interpreter Needed?: No  Information entered by :: Lanier Ensign, LPN   Activities of Daily Living    03/03/2023    3:42 PM  In your present state of health, do  you have any difficulty performing the following activities:  Hearing? 0  Vision? 0  Difficulty concentrating or making decisions? 0  Walking or climbing stairs? 0  Dressing or bathing? 0  Doing errands, shopping? 0  Preparing Food and eating ? N  Using the Toilet? N  In the past six months, have you accidently leaked urine? N  Do you have problems with loss of bowel control? N  Managing your Medications? N  Managing your Finances? N  Housekeeping or managing your Housekeeping? N    Patient Care Team: Ardith Dark, MD as PCP - General (Family Medicine) Little Ishikawa, MD as PCP - Cardiology (Cardiology) Berneice Heinrich Delbert Phenix., MD as Consulting Physician (Urology) Callie Fielding, MD as Consulting Physician (Physical Medicine and Rehabilitation) Jethro Bolus, MD as Consulting Physician (Ophthalmology) Lisbeth Renshaw, MD as Consulting Physician (Neurosurgery) Erroll Luna, [facility] as Pharmacist (Pharmacist)  Indicate any recent Medical Services you may have received from other than [facility] providers in the past year (date may be approximate).     Assessment:   This is a routine wellness examination for Veryl.  Hearing/Vision screen Hearing Screening - Comments:: Pt denies any hearing  issues  Vision Screening - Comments:: Pt follows up with Dr Nile Riggs   Dietary issues and exercise activities discussed: Current Exercise Habits: The patient does not participate in regular exercise at  present   Goals Addressed             This Visit's Progress    Patient Stated       Lose weight       Depression Screen    03/03/2023    3:39 PM 08/08/2022    9:05 AM 07/16/2022    7:59 AM 02/28/2022    1:09 PM 07/10/2021    9:33 AM 02/22/2021   11:57 AM 07/06/2019    8:14 AM  PHQ 2/9 Scores  PHQ - 2 Score 0 0 0 0 0 0 0    Fall Risk    03/03/2023    3:41 PM 08/08/2022    9:05 AM 07/16/2022    7:58 AM 02/28/2022    1:12 PM 11/06/2021   10:07 AM  Fall Risk   Falls in the past year? 0 0 0 0 0  Number falls in past yr: 0 0 0 0   Injury with Fall? 0 0 0 0   Risk for fall due to : Impaired balance/gait;Impaired vision No Fall Risks No Fall Risks Impaired vision   Follow up Falls prevention discussed  Falls evaluation completed Falls prevention discussed     FALL RISK PREVENTION PERTAINING TO THE HOME:  Any stairs in or around the home? No  If so, are there any without handrails? No  Home free of loose throw rugs in walkways, pet beds, electrical cords, etc? Yes  Adequate lighting in your home to reduce risk of falls? Yes   ASSISTIVE DEVICES UTILIZED TO PREVENT FALLS:  Life alert? No  Use of a cane, walker or w/c? No  Grab bars in the bathroom? Yes  Shower chair or bench in shower? No  Elevated toilet seat or a handicapped toilet? No   TIMED UP AND GO:  Was the test performed? No .   Cognitive Function:        03/03/2023    3:42 PM 02/28/2022    1:13 PM 02/22/2021   12:03 PM  6CIT Screen  What Year? 0 points 0 points 0 points  What month? 0  points 0 points 0 points  What time? 0 points 0 points   Count back from 20 0 points 0 points 0 points  Months in reverse 0 points 0 points 0 points  Repeat phrase 0 points 0 points 0 points  Total Score 0 points 0 points     Immunizations Immunization History  Administered Date(s) Administered   Hep A / Hep B 05/24/2012   Hepatitis B 06/24/2012, 12/27/2012   Influenza,inj,Quad PF,6+ Mos 11/24/2013   PFIZER(Purple  Top)SARS-COV-2 Vaccination 12/25/2019, 01/19/2020, 09/30/2020   Pneumococcal Conjugate-13 03/09/2014   Pneumococcal Polysaccharide-23 02/17/2012   Tetanus 09/20/2014    TDAP status: Due, Education has been provided regarding the importance of this vaccine. Advised may receive this vaccine at local pharmacy or Health Dept. Aware to provide a copy of the vaccination record if obtained from local pharmacy or Health Dept. Verbalized acceptance and understanding.  Flu Vaccine status: Declined, Education has been provided regarding the importance of this vaccine but patient still declined. Advised may receive this vaccine at local pharmacy or Health Dept. Aware to provide a copy of the vaccination record if obtained from local pharmacy or Health Dept. Verbalized acceptance and understanding.  Pneumococcal vaccine status: Up to date  Covid-19 vaccine status: Completed vaccines  Qualifies for Shingles Vaccine? No     Screening Tests Health Maintenance  Topic Date Due   DTaP/Tdap/Td (1 - Tdap) 09/21/2014   COVID-19 Vaccine (4 - 2023-24 season) 07/18/2022   COLONOSCOPY (Pts 45-21yrs Insurance coverage will need to be confirmed)  07/11/2023 (Originally 10/01/2020)   Medicare Annual Wellness (AWV)  03/02/2024   Pneumonia Vaccine 63+ Years old  Completed   Hepatitis C Screening  Completed   HPV VACCINES  Aged Out   INFLUENZA VACCINE  Discontinued   Zoster Vaccines- Shingrix  Discontinued    Health Maintenance  Health Maintenance Due  Topic Date Due   DTaP/Tdap/Td (1 - Tdap) 09/21/2014   COVID-19 Vaccine (4 - 2023-24 season) 07/18/2022    Colorectal cancer screening: Type of screening: Colonoscopy. Completed 10/02/15. Repeat every 5 years    Additional Screening:  Hepatitis C Screening:  Completed 07/06/19  Vision Screening: Recommended annual ophthalmology exams for early detection of glaucoma and other disorders of the eye. Is the patient up to date with their annual eye exam?  Yes   Who is the provider or what is the name of the office in which the patient attends annual eye exams? Dr Nile Riggs  If pt is not established with a provider, would they like to be referred to a provider to establish care? No .   Dental Screening: Recommended annual dental exams for proper oral hygiene  Community Resource Referral / Chronic Care Management: CRR required this visit?  No   CCM required this visit?  No      Plan:     I have personally reviewed and noted the following in the patient's chart:   Medical and social history Use of alcohol, tobacco or illicit drugs  Current medications and supplements including opioid prescriptions. Patient is not currently taking opioid prescriptions. Functional ability and status Nutritional status Physical activity Advanced directives List of other physicians Hospitalizations, surgeries, and ER visits in previous 12 months Vitals Screenings to include cognitive, depression, and falls Referrals and appointments  In addition, I have reviewed and discussed with patient certain preventive protocols, quality metrics, and best practice recommendations. A written personalized care plan for preventive services as well as general preventive health recommendations were provided to  patient.     Marzella Schlein, LPN   1/61/0960   Nurse Notes: none

## 2023-03-19 DIAGNOSIS — M48062 Spinal stenosis, lumbar region with neurogenic claudication: Secondary | ICD-10-CM | POA: Diagnosis not present

## 2023-03-19 DIAGNOSIS — M4316 Spondylolisthesis, lumbar region: Secondary | ICD-10-CM | POA: Diagnosis not present

## 2023-03-20 ENCOUNTER — Other Ambulatory Visit: Payer: Self-pay | Admitting: Orthopedic Surgery

## 2023-03-20 DIAGNOSIS — M4316 Spondylolisthesis, lumbar region: Secondary | ICD-10-CM

## 2023-03-20 DIAGNOSIS — M48062 Spinal stenosis, lumbar region with neurogenic claudication: Secondary | ICD-10-CM

## 2023-03-24 DIAGNOSIS — M4316 Spondylolisthesis, lumbar region: Secondary | ICD-10-CM | POA: Diagnosis not present

## 2023-03-24 DIAGNOSIS — M48062 Spinal stenosis, lumbar region with neurogenic claudication: Secondary | ICD-10-CM | POA: Diagnosis not present

## 2023-03-31 ENCOUNTER — Ambulatory Visit (INDEPENDENT_AMBULATORY_CARE_PROVIDER_SITE_OTHER): Payer: Medicare HMO | Admitting: Physician Assistant

## 2023-03-31 ENCOUNTER — Encounter: Payer: Self-pay | Admitting: Physician Assistant

## 2023-03-31 VITALS — BP 126/80 | HR 59 | Temp 98.0°F | Ht 70.0 in | Wt 203.2 lb

## 2023-03-31 DIAGNOSIS — S81812A Laceration without foreign body, left lower leg, initial encounter: Secondary | ICD-10-CM

## 2023-03-31 DIAGNOSIS — Z23 Encounter for immunization: Secondary | ICD-10-CM

## 2023-03-31 MED ORDER — DOXYCYCLINE HYCLATE 100 MG PO TABS
100.0000 mg | ORAL_TABLET | Freq: Two times a day (BID) | ORAL | 0 refills | Status: DC
Start: 2023-03-31 — End: 2023-07-24

## 2023-03-31 NOTE — Progress Notes (Signed)
Carlos David is a 77 y.o. male here for a new problem.  History of Present Illness:   Chief Complaint  Patient presents with   Laceration    Pt c/o laceration left leg from a piece of wood. Pt cleansed area good, put neosporin on, applied band aid.      Yesterday patient was doing some work with wood and scraped his lower left leg on a piece of the wood He cleansed the area immediately with hydrogen peroxide Due to 81 mg aspirin use he had some significant bleeding but he was able to stop this within a few minutes with pressure --denies any lightheadedness or dizziness     Past Medical History:  Diagnosis Date   Actinic keratosis 12/16/2010   Qualifier: Diagnosis of  By: Amador Cunas  MD, Janett Labella    BPH (benign prostatic hyperplasia)    Cancer (HCC)    thyroid cancer 1990   Cataract    both eyes   History of radiation therapy 1990   HYPERLIPIDEMIA 12/26/2008   Hypertension    HYPOTHYROIDISM 12/26/2008   PPD positive    Thoracic aortic aneurysm (HCC)    THYROID CANCER, HX OF 1990   THYROID CANCER, HX OF 12/26/2008   Qualifier: Diagnosis of  By: Amador Cunas  MD, Janett Labella      Social History   Tobacco Use   Smoking status: Former    Types: Cigarettes, Pipe    Quit date: 11/17/1968    Years since quitting: 54.4   Smokeless tobacco: Never  Vaping Use   Vaping Use: Never used  Substance Use Topics   Alcohol use: Yes    Alcohol/week: 0.0 standard drinks of alcohol    Comment: occas. beer   Drug use: No    Past Surgical History:  Procedure Laterality Date   CATARACT EXTRACTION, BILATERAL  2020   COLONOSCOPY     10-ye ago-normal at [facility]   EYE SURGERY     as child   HAND SURGERY     right hand trigger figger    INGUINAL HERNIA REPAIR Right 02/25/2022   Procedure: OPEN RIGHT INGUINAL HERNIA REPAIR WITH MESH;  Surgeon: Kinsinger, De Blanch, MD;  Location: [facility];  Service: General;  Laterality: Right;   neckotomy  1990   20 lymph nodes  removed from left neck   TOTAL THYROIDECTOMY  1990   TRANSURETHRAL RESECTION OF PROSTATE N/A 01/21/2019   Procedure: TRANSURETHRAL RESECTION OF THE PROSTATE (TURP);  Surgeon: Sebastian Ache, MD;  Location: [facility];  Service: Urology;  Laterality: N/A;  2 MINS    Family History  Problem Relation Age of Onset   Colon cancer Neg Hx     Allergies  Allergen Reactions   Penicillins Hives    Current Medications:   Current Outpatient Medications:    aspirin EC 81 MG tablet, Take 1 tablet (81 mg total) by mouth daily. Swallow whole., Disp: 90 tablet, Rfl: 3   atorvastatin (LIPITOR) 40 MG tablet, Take 1 tablet (40 mg total) by mouth daily., Disp: 90 tablet, Rfl: 3   doxycycline (VIBRA-TABS) 100 MG tablet, Take 1 tablet (100 mg total) by mouth 2 (two) times daily., Disp: 14 tablet, Rfl: 0   ezetimibe (ZETIA) 10 MG tablet, Take 1 tablet (10 mg total) by mouth daily., Disp: 90 tablet, Rfl: 3   finasteride (PROSCAR) 5 MG tablet, TAKE 1 TABLET BY MOUTH EVERY DAY, Disp: 90 tablet, Rfl: 1   gabapentin (NEURONTIN) 300 MG  capsule, Take 1 capsule by mouth 2 (two) times daily., Disp: , Rfl:    hydrocortisone 2.5 % cream, , Disp: , Rfl:    levothyroxine (SYNTHROID) 137 MCG tablet, TAKE 1 TABLET BY MOUTH EVERY DAY, Disp: 90 tablet, Rfl: 1   losartan (COZAAR) 100 MG tablet, TAKE 1 TABLET BY MOUTH EVERY DAY, Disp: 90 tablet, Rfl: 3   meloxicam (MOBIC) 15 MG tablet, Take 1 tablet (15 mg total) by mouth as needed., Disp: 30 tablet, Rfl: 0   Review of Systems:   ROS Negative unless otherwise specified per HPI.   Vitals:   Vitals:   03/31/23 1521  BP: 126/80  Pulse: (!) 59  Temp: 98 F (36.7 C)  TempSrc: Temporal  SpO2: 98%  Weight: 203 lb 4 oz (92.2 kg)  Height: 5\' 10"  (1.778 m)     Body mass index is 29.16 kg/m.  Physical Exam:   Physical Exam Vitals and nursing note reviewed.  Constitutional:      Appearance: He is well-developed.  HENT:     Head: Normocephalic.   Eyes:     Conjunctiva/sclera: Conjunctivae normal.     Pupils: Pupils are equal, round, and reactive to light.  Pulmonary:     Effort: Pulmonary effort is normal.  Musculoskeletal:        General: Normal range of motion.     Cervical back: Normal range of motion.  Skin:    General: Skin is warm and dry.     Comments: Approximately 1 cm V-shaped skin tear to left lateral lower leg with dried blood; no purulent discharge, tenderness, surrounding erythema  Neurological:     Mental Status: He is alert and oriented to person, place, and time.  Psychiatric:        Behavior: Behavior normal.        Thought Content: Thought content normal.        Judgment: Judgment normal.    Procedure: Steri-Strip placement Area was cleaned with saline and small amount of Dermabond was applied to area to help adhere Steri-Strips Steri-Strips were placed to keep wound from reopening Patient tolerated procedure well Sterile bandage applied to area     Assessment and Plan:   Laceration of left lower leg, initial encounter No red flags on exam No evidence of infection We have bandaged area to hopefully keep it from reopening I did give him a safety net prescription of doxycycline should it become infected as he is about [place] leave for the beach for 1 [place]  If he needs to take this antibiotic I recommend that he follow-up in our office so we can evaluate it when he returns I also gave him information on wound care  We updated TD today  Jarold Motto, PA-C

## 2023-03-31 NOTE — Patient Instructions (Signed)
See attached handout on wound care  If ANY concerns for infection -- start the antibiotic.

## 2023-04-01 ENCOUNTER — Encounter: Payer: Self-pay | Admitting: Orthopedic Surgery

## 2023-04-13 ENCOUNTER — Other Ambulatory Visit: Payer: Self-pay | Admitting: Family Medicine

## 2023-04-15 DIAGNOSIS — M79642 Pain in left hand: Secondary | ICD-10-CM | POA: Diagnosis not present

## 2023-04-15 DIAGNOSIS — M79641 Pain in right hand: Secondary | ICD-10-CM | POA: Diagnosis not present

## 2023-04-20 ENCOUNTER — Encounter: Payer: Self-pay | Admitting: Cardiology

## 2023-04-21 ENCOUNTER — Ambulatory Visit
Admission: RE | Admit: 2023-04-21 | Discharge: 2023-04-21 | Disposition: A | Discharge status: Home / Self Care | Payer: Medicare HMO | Source: Ambulatory Visit | Attending: Orthopedic Surgery | Admitting: Orthopedic Surgery

## 2023-04-21 DIAGNOSIS — M4316 Spondylolisthesis, lumbar region: Secondary | ICD-10-CM

## 2023-04-21 DIAGNOSIS — M48062 Spinal stenosis, lumbar region with neurogenic claudication: Secondary | ICD-10-CM

## 2023-04-21 DIAGNOSIS — M5126 Other intervertebral disc displacement, lumbar region: Secondary | ICD-10-CM | POA: Diagnosis not present

## 2023-04-21 DIAGNOSIS — M47816 Spondylosis without myelopathy or radiculopathy, lumbar region: Secondary | ICD-10-CM | POA: Diagnosis not present

## 2023-04-21 DIAGNOSIS — M48061 Spinal stenosis, lumbar region without neurogenic claudication: Secondary | ICD-10-CM | POA: Diagnosis not present

## 2023-04-23 ENCOUNTER — Ambulatory Visit (HOSPITAL_COMMUNITY): Payer: Medicare HMO | Attending: Internal Medicine

## 2023-04-23 DIAGNOSIS — E785 Hyperlipidemia, unspecified: Secondary | ICD-10-CM | POA: Diagnosis not present

## 2023-04-23 DIAGNOSIS — I77819 Aortic ectasia, unspecified site: Secondary | ICD-10-CM | POA: Insufficient documentation

## 2023-04-23 DIAGNOSIS — I10 Essential (primary) hypertension: Secondary | ICD-10-CM | POA: Diagnosis not present

## 2023-04-23 DIAGNOSIS — I340 Nonrheumatic mitral (valve) insufficiency: Secondary | ICD-10-CM | POA: Insufficient documentation

## 2023-04-23 DIAGNOSIS — I2510 Atherosclerotic heart disease of native coronary artery without angina pectoris: Secondary | ICD-10-CM | POA: Insufficient documentation

## 2023-04-23 DIAGNOSIS — Z923 Personal history of irradiation: Secondary | ICD-10-CM | POA: Diagnosis not present

## 2023-04-23 DIAGNOSIS — Z85850 Personal history of malignant neoplasm of thyroid: Secondary | ICD-10-CM | POA: Insufficient documentation

## 2023-04-23 DIAGNOSIS — Z8249 Family history of ischemic heart disease and other diseases of the circulatory system: Secondary | ICD-10-CM | POA: Diagnosis not present

## 2023-04-23 DIAGNOSIS — Z87891 Personal history of nicotine dependence: Secondary | ICD-10-CM | POA: Diagnosis not present

## 2023-04-23 LAB — ECHOCARDIOGRAM COMPLETE
Area-P 1/2: 2.31 cm2
S' Lateral: 3.6 cm

## 2023-04-24 ENCOUNTER — Other Ambulatory Visit: Payer: Self-pay

## 2023-04-24 DIAGNOSIS — I77819 Aortic ectasia, unspecified site: Secondary | ICD-10-CM

## 2023-04-24 DIAGNOSIS — I2510 Atherosclerotic heart disease of native coronary artery without angina pectoris: Secondary | ICD-10-CM

## 2023-04-24 DIAGNOSIS — E785 Hyperlipidemia, unspecified: Secondary | ICD-10-CM

## 2023-04-27 DIAGNOSIS — M47816 Spondylosis without myelopathy or radiculopathy, lumbar region: Secondary | ICD-10-CM | POA: Diagnosis not present

## 2023-04-30 DIAGNOSIS — E785 Hyperlipidemia, unspecified: Secondary | ICD-10-CM | POA: Diagnosis not present

## 2023-04-30 DIAGNOSIS — I2510 Atherosclerotic heart disease of native coronary artery without angina pectoris: Secondary | ICD-10-CM | POA: Diagnosis not present

## 2023-05-01 LAB — LIPID PANEL
Chol/HDL Ratio: 1.9 ratio (ref 0.0–5.0)
Cholesterol, Total: 119 mg/dL (ref 100–199)
HDL: 63 mg/dL (ref >39)
LDL Chol Calc (NIH): 43 mg/dL (ref 0–99)
Triglycerides: 58 mg/dL (ref 0–149)
VLDL Cholesterol Cal: 13 mg/dL (ref 5–40)

## 2023-05-03 NOTE — Progress Notes (Unsigned)
Cardiology Office Note:    Date:  05/04/2023   ID:  Carlos David, DOB May 23, 1946, MRN [id number]  PCP:  Ardith Dark, MD  Cardiologist:  Little Ishikawa, MD  Electrophysiologist:  None   Referring MD: Ardith Dark, MD   Chief Complaint  Patient presents with   Coronary Artery Disease     History of Present Illness:    Carlos David is a 77 y.o. male with a hx of thyroid cancer now hypothyroidism, hyperlipidemia who presents for follow-up.  He was referred by Jarold Motto, PA-C for evaluation of chest pain, initially seen on 01/19/2020.  He reported that he has been having chest pain for the preceding few months.  Occured daily.  Chest pain is located under his left breast.  Describes as pinching pain.  Can last for few minutes.  Typically occurs at rest.  Reports that he exercises at the [facility], will do the elliptical for 30 minutes.  Has not noted exertional chest pain.  Does report some lightheadedness with standing.  Denies any syncope, palpitations, lower extremity edema.  Smoked cigarettes as teenager for few years.  Smoked pipe for 10 years.  Mother had MI in 15s.    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  Lexiscan Myoview on 08/07/2021 showed normal perfusion, EF 51%.  Since last clinic visit, he reports that he is doing well.  States that his joint pains improved with decreasing atorvastatin back to 40 mg daily.  Denies any chest pain, dyspnea, lightheadedness, syncope, lower extremity edema, or palpitations.  Has not been exercising in the gym recently but has been doing regular yard work.   Past Medical History:  Diagnosis Date   Actinic keratosis 12/16/2010    Qualifier: Diagnosis of  By: Amador Cunas  MD, Janett Labella    BPH (benign prostatic hyperplasia)    Cancer [facility])    thyroid cancer 1990   Cataract    both eyes   History of radiation therapy 1990   HYPERLIPIDEMIA 12/26/2008   Hypertension    HYPOTHYROIDISM 12/26/2008   PPD positive    Thoracic aortic aneurysm (HCC)    THYROID CANCER, HX OF 1990   THYROID CANCER, HX OF 12/26/2008   Qualifier: Diagnosis of  By: Amador Cunas  MD, Janett Labella     Past Surgical History:  Procedure Laterality Date   CATARACT EXTRACTION, BILATERAL  2020   COLONOSCOPY     10-ye ago-normal at [facility]   EYE SURGERY     as child   HAND SURGERY     right hand trigger figger    INGUINAL HERNIA REPAIR Right 02/25/2022   Procedure: OPEN RIGHT INGUINAL HERNIA REPAIR WITH MESH;  Surgeon: Kinsinger, De Blanch, MD;  Location: [facility];  Service: General;  Laterality: Right;   neckotomy  1990   20 lymph nodes removed from left neck   TOTAL THYROIDECTOMY  1990   TRANSURETHRAL RESECTION OF PROSTATE N/A 01/21/2019   Procedure: TRANSURETHRAL RESECTION OF THE PROSTATE (TURP);  Surgeon: Sebastian Ache, MD;  Location: [facility];  Service: Urology;  Laterality: N/A;  75 MINS    Current Medications: Current Meds  Medication Sig   aspirin EC 81 MG tablet  Take 1 tablet (81 mg total) by mouth daily. Swallow whole.   atorvastatin (LIPITOR) 40 MG tablet Take 1 tablet (40 mg total) by mouth daily.   ezetimibe (ZETIA) 10 MG tablet Take 1 tablet (10 mg total) by mouth daily.   finasteride (PROSCAR) 5 MG tablet TAKE 1 TABLET BY MOUTH EVERY DAY   gabapentin (NEURONTIN) 300 MG capsule Take 1 capsule by mouth 2 (two) times daily.   hydrocortisone 2.5 % cream    levothyroxine (SYNTHROID) 137 MCG tablet TAKE 1 TABLET BY MOUTH EVERY DAY   losartan (COZAAR) 100 MG tablet TAKE 1 TABLET BY MOUTH EVERY DAY   meloxicam (MOBIC) 15 MG tablet Take 1 tablet (15 mg total) by mouth as needed.     Allergies:    Penicillins   Social History   Socioeconomic History   Marital status: Married    Spouse name: Not on file   Number of children: Not on file   Years of education: Not on file   Highest education level: Not on file  Occupational History   Not on file  Tobacco Use   Smoking status: Former    Types: Cigarettes, Pipe    Quit date: 11/17/1968    Years since quitting: 54.4   Smokeless tobacco: Never  Vaping Use   Vaping Use: Never used  Substance and Sexual Activity   Alcohol use: Yes    Alcohol/week: 0.0 standard drinks of alcohol    Comment: occas. beer   Drug use: No   Sexual activity: Not on file  Other Topics Concern   Not on file  Social History Narrative   Not on file   Social Determinants of Health   Financial Resource Strain: Low Risk  (03/03/2023)   Overall Financial Resource Strain (CARDIA)    Difficulty of Paying Living Expenses: Not hard at all  Food Insecurity: No Food Insecurity (03/03/2023)   Hunger Vital Sign    Worried About Running Out of Food in the Last Year: Never true    Ran Out of Food in the Last Year: Never true  Transportation Needs: No Transportation Needs (03/03/2023)   PRAPARE - Administrator, Civil Service (Medical): No    Lack of Transportation (Non-Medical): No  Physical Activity: Inactive (03/03/2023)   Exercise Vital Sign    Days of Exercise per Week: 0 days    Minutes of Exercise per Session: 0 min  Stress: No Stress Concern Present (03/03/2023)   [employer] of Occupational Health - Occupational Stress Questionnaire    Feeling of Stress : Not at all  Social Connections: Moderately Integrated (03/03/2023)   Social Connection and Isolation Panel [NHANES]    Frequency of Communication with Friends and Family: More than three times a week    Frequency of Social Gatherings with Friends and Family: More than three times a week    Attends Religious Services: More than 4 times per year    Active Member of [employer] or Organizations:  No    Attends Banker Meetings: Never    Marital Status: Married     Family History: The patient's family history is negative for Colon cancer.  ROS:   Please see the history of present illness.     All other systems reviewed and are negative.  EKGs/Labs/Other Studies Reviewed:    The following studies were reviewed today:   EKG:   05/04/2023: Sinus bradycardia, rate 57, poor R wave progression, no ST abnormalities 10/23/2022: Normal sinus rhythm, rate  69, left axis deviation, no ST abnormality 02/06/22: NSR, rate 68. LAD, no ST abnormalities 07/30/2021: Normal sinus rhythm, rate 74, no ST abnormalities 04/22/2021: NSR, rate 65, No ST abnormalities 01/19/2020: normal sinus rhythm, rate 69, left axis deviation, no ST/T abnormalities  Lexiscan Myoview 11/28/16: Nuclear stress EF: 54%. The study is normal. This is a low risk study. The left ventricular ejection fraction is mildly decreased (45-54%). There was no ST segment deviation noted during stress.   Normal resting and stress perfusion. No ischemia or infarction EF 54%   Coronary CTA 03/15/20: IMPRESSION: 1. Coronary calcium score of 526. This was 31 percentile for age and sex matched control.   2. Normal coronary origin with right dominance.   3. CAD-RADS 3. Mild stenosis in the proximal and moderate stenosis in the mid LAD. Consider symptom-guided anti-ischemic pharmacotherapy as well as risk factor modification per guideline directed care. Additional analysis with CT FFR will be submitted.   4. 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 No dissection.   5. Mildly dilated pulmonary artery measuring 33 mm.  1. Left Main: 0.99.   2. LAD: Proximal: 0.96, mid: 0.86, distal: 0.83. 3. LCX: 0.94 4. OM1: 0.96. 5. OM2: 9.97. 6. RCA: Proximal: 0.98, distal: 0.96.   IMPRESSION: 1. CT FFR analysis didn't show any significant stenosis. Aggressive medical management is recommended.     IMPRESSION: 1.  No acute findings in the imaged extracardiac chest. 2.  Aortic Atherosclerosis (ICD10-I70.0).   TTE 02/06/20:  1. Left ventricular ejection fraction, by estimation, is 60 to 65%. The  left ventricle has normal function. The left ventricle has no regional  wall motion abnormalities. There is mild asymmetric left ventricular  hypertrophy. Left ventricular diastolic  parameters are indeterminate.   2. Right ventricular systolic function is normal. The right ventricular  size is normal.   3. The mitral valve is normal in structure. Mild mitral valve  regurgitation. No evidence of mitral stenosis.   4. The aortic valve is normal in structure. Aortic valve regurgitation is  not visualized. No aortic stenosis is present.   5. Aortic dilatation noted. There is mild to moderate dilatation of the  ascending aorta measuring 44 mm.   CT FFR 03/15/2020: FINDINGS: FFRct analysis was performed on the original cardiac CT angiogram dataset. Diagrammatic representation of the FFRct analysis is provided in a separate PDF document in PACS. This dictation was created using the PDF document and an interactive 3D model of the results. 3D model is not available in the EMR/PACS. Normal FFR range is >0.80.   1. Left Main: 0.99. 2. LAD: Proximal: 0.96, mid: 0.86, distal: 0.83. 3. LCX: 0.94 4. OM1: 0.96. 5. OM2: 9.97. 6. RCA: Proximal: 0.98, distal: 0.96.   IMPRESSION: 1. CT FFR analysis didn't show any significant stenosis. Aggressive medical management is recommended.  CT Angiography Chest 03/13/2021: IMPRESSION: 1. 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 Recommend annual imaging followup by CTA or MRA. This recommendation follows 2010 ACCF/AHA/AATS/ACR/ASA/SCA/SCAI/SIR/STS/SVM Guidelines for the Diagnosis and Management of Patients with Thoracic Aortic Disease. Circulation. 2010; 121: N829-F621. Aortic aneurysm NOS (ICD10-I71.9) 2. No  acute chest abnormality. 3. Small pleural-based nodular density in the right upper lobe measuring roughly 5 mm. This is indeterminate. No follow-up needed if patient is low-risk. Non-contrast chest CT can be considered in 12 months if patient is high-risk. This recommendation follows the consensus statement: Guidelines for Management of Incidental Pulmonary Nodules Detected on CT Images: From  the Fleischner Society 2017; Radiology 2017; 284:228-243. 4. Evidence for small tracheal diverticula. Recommend attention on follow-up imaging.  Recent Labs: 07/16/2022: ALT 21; BUN 23; Creatinine, Ser 1.02; Hemoglobin 14.6; Platelets 168.0; Potassium 4.0; Sodium 138; TSH 1.37  Recent Lipid Panel    Component Value Date/Time   CHOL 119 04/30/2023 0824   TRIG 58 04/30/2023 0824   HDL 63 04/30/2023 0824   CHOLHDL 1.9 04/30/2023 0824   CHOLHDL 2 07/16/2022 0846   VLDL 17.4 07/16/2022 0846   LDLCALC 43 04/30/2023 0824    Physical Exam:    VS:  BP 124/76 (BP Location: Left Arm, Patient Position: Sitting, Cuff Size: Normal)   Pulse (!) 57   Ht 5\' 10"  (1.778 m)   Wt 200 lb 3.2 oz (90.8 kg)   SpO2 95%   BMI 28.73 kg/m     Wt Readings from Last 3 Encounters:  05/04/23 200 lb 3.2 oz (90.8 kg)  03/31/23 203 lb 4 oz (92.2 kg)  03/03/23 195 lb (88.5 kg)     GEN:  Well nourished, well developed in no acute distress HEENT: Normal NECK: No JVD; No carotid bruits LYMPHATICS: No lymphadenopathy CARDIAC: RRR, no murmurs, rubs, gallops RESPIRATORY:  Clear to auscultation without rales, wheezing or rhonchi  ABDOMEN: Soft, non-tender, non-distended MUSCULOSKELETAL:  No edema; No deformity  SKIN: Warm and dry NEUROLOGIC:  Alert and oriented x 3 PSYCHIATRIC:  Normal affect   ASSESSMENT:    1. CAD in native artery   2. Aortic dilatation (HCC)   3. Hyperlipidemia, unspecified hyperlipidemia type   4. Essential hypertension     PLAN:    CAD: Presented with atypical chest pain.  Coronary CTA  03/15/20 showed calcium score 526 (65th percentile), mild stenosis in the proximal LAD, moderate stenosis in the mid LAD (no significant obstructive CAD by CT FFR), dilated ascending aorta measuring 42 mm.  Reported no chest pain but was having dyspnea on exertion, Lexiscan Myoview on 08/07/2021 showed normal perfusion, EF 51%.  Currently denies any anginal symptoms -Continue atorvastatin 40 mg daily and Zetia 10 mg daily -Continue aspirin 81 mg daily  Hypertension: On losartan 100 mg daily.  Appears controlled.   Hyperlipidemia: on atorvastatin 40 mg daily, LDL 87 on 07/16/22.  Increased atorvastatin to 80 mg daily but did not tolerate due to joint pain.  Atorvastatin decreased back to 40 mg daily and Zetia 10 mg daily added.  LDL 43 on 04/30/2023  Aortic dilatation: ascending aorta measures 42mm on CTA.  Repeat CTA on 02/2021 showed stable ascending aortic dilatation measuring 40 mm.  CTA chest 02/2022 showed stable 41 mm ascending aorta.  Echocardiogram 04/23/2023 showed ascending aortic dilatation measuring 45 mm -Follow-up with echocardiogram in 1 year  Pulmonary nodule: 5 mm right upper lobe nodule noted on CTA chest 03/13/2021.  Follow-up CT chest in 1 year (02/2022) showed stable right upper lobe nodule, no further workup recommended  Leg numbness: Likely due to known spinal stenosis.  Normal ABIs  04/16/22   RTC in 6 months   Medication Adjustments/Labs and Tests Ordered: Current medicines are reviewed at length with the patient today.  Concerns regarding medicines are outlined above.  No orders of the defined types were placed in this encounter.   No orders of the defined types were placed in this encounter.    Patient Instructions  Medication Instructions:  Continue same medications *If you need a refill on your cardiac medications before your next appointment, please call your pharmacy*   Lab  Work: None ordered   Testing/Procedures: None ordered   Follow-Up: At [facility], you and your health needs are our priority.  As part of our continuing mission to provide you with exceptional heart care, we have created designated Provider Care Teams.  These Care Teams include your primary Cardiologist (physician) and Advanced Practice Providers (APPs -  Physician Assistants and Nurse Practitioners) who all work together to provide you with the care you need, when you need it.  We recommend signing up for the patient portal called "MyChart".  Sign up information is provided on this After Visit Summary.  MyChart is used to connect with patients for Virtual Visits (Telemedicine).  Patients are able to view lab/test results, encounter notes, upcoming appointments, etc.  Non-urgent messages can be sent to your provider as well.   To learn more about what you can do with MyChart, go to [email or web address].    Your next appointment:  6 months    Monday 12/2 at 8:20 am    Provider:  Dr.Sarayu Prevost       Signed, Little Ishikawa, MD  05/04/2023 9:05 AM    [facility]

## 2023-05-04 ENCOUNTER — Ambulatory Visit: Payer: Medicare HMO | Attending: Cardiology | Admitting: Cardiology

## 2023-05-04 ENCOUNTER — Encounter: Payer: Self-pay | Admitting: Cardiology

## 2023-05-04 VITALS — BP 124/76 | HR 57 | Ht 70.0 in | Wt 200.2 lb

## 2023-05-04 DIAGNOSIS — I2510 Atherosclerotic heart disease of native coronary artery without angina pectoris: Secondary | ICD-10-CM | POA: Diagnosis not present

## 2023-05-04 DIAGNOSIS — I10 Essential (primary) hypertension: Secondary | ICD-10-CM

## 2023-05-04 DIAGNOSIS — E785 Hyperlipidemia, unspecified: Secondary | ICD-10-CM

## 2023-05-04 DIAGNOSIS — I77819 Aortic ectasia, unspecified site: Secondary | ICD-10-CM

## 2023-05-04 NOTE — Patient Instructions (Addendum)
Medication Instructions:  Continue same medications *If you need a refill on your cardiac medications before your next appointment, please call your pharmacy*   Lab Work: None ordered   Testing/Procedures: None ordered   Follow-Up: At [facility], you and your health needs are our priority.  As part of our continuing mission to provide you with exceptional heart care, we have created designated Provider Care Teams.  These Care Teams include your primary Cardiologist (physician) and Advanced Practice Providers (APPs -  Physician Assistants and Nurse Practitioners) who all work together to provide you with the care you need, when you need it.  We recommend signing up for the patient portal called "MyChart".  Sign up information is provided on this After Visit Summary.  MyChart is used to connect with patients for Virtual Visits (Telemedicine).  Patients are able to view lab/test results, encounter notes, upcoming appointments, etc.  Non-urgent messages can be sent to your provider as well.   To learn more about what you can do with MyChart, go to [email or web address].    Your next appointment:  6 months    Monday 12/2 at 8:20 am    Provider:  Dr.Schumann

## 2023-05-15 DIAGNOSIS — R3914 Feeling of incomplete bladder emptying: Secondary | ICD-10-CM | POA: Diagnosis not present

## 2023-05-15 DIAGNOSIS — N5201 Erectile dysfunction due to arterial insufficiency: Secondary | ICD-10-CM | POA: Diagnosis not present

## 2023-06-04 DIAGNOSIS — M4316 Spondylolisthesis, lumbar region: Secondary | ICD-10-CM | POA: Diagnosis not present

## 2023-06-11 ENCOUNTER — Encounter: Payer: Self-pay | Admitting: Orthopedic Surgery

## 2023-06-11 ENCOUNTER — Other Ambulatory Visit: Payer: Self-pay

## 2023-06-11 ENCOUNTER — Ambulatory Visit: Payer: Medicare HMO | Admitting: Orthopedic Surgery

## 2023-06-11 DIAGNOSIS — M79672 Pain in left foot: Secondary | ICD-10-CM

## 2023-06-11 NOTE — Progress Notes (Signed)
Office Visit Note   Patient: Carlos David           Date of Birth: Apr 28, 1946           MRN: [id number] Visit Date: 06/11/2023              Requested by: Ardith Dark, MD [address] [place],  [place] [address] PCP: Ardith Dark, MD  Chief Complaint  Patient presents with   Left Foot - Pain      HPI: Patient is a 77 year old gentleman who has a plantarflexed first ray and had the orthotic modified to allow for plantarflexion of the first ray.  Patient states this resolved his symptoms however he now has new pain over the base of the fifth metatarsal.  Assessment & Plan: Visit Diagnoses:  1. Pain in left foot     Plan: Radiographs show arthritic changes.  Recommend continue his stiff soled new balance sneakers.  Continue the orthotic he may use Voltaren gel.  No surgical intervention indicated.  Follow-Up Instructions: Return if symptoms worsen or fail to improve.   Ortho Exam  Patient is alert, oriented, no adenopathy, well-dressed, normal affect, normal respiratory effort. Examination patient has osteoarthritis involving the PIP and DIP joints of his fingers.  He has onychomycotic left great toenail and this was trimmed.  Patient is tender to palpation at the base of the fifth metatarsal cuboid joint radiographs shows bone-on-bone arthritic changes.  Patient has good stiff shoes and modified orthotics.  Imaging: XR Foot Complete Left  Result Date: 06/11/2023 Three-view radiographs of the left foot shows no fracture through the base of the fifth metatarsal.  Patient does have degenerative arthritic changes base of the fifth metatarsal with essentially bone-on-bone contact.  No images are attached to the encounter.  Labs: Lab Results  Component Value Date   HGBA1C 6.2 07/16/2022   HGBA1C 6.1 07/10/2021     Lab Results  Component Value Date   ALBUMIN 4.2 07/16/2022   ALBUMIN 4.2 07/10/2021   ALBUMIN 4.3 01/18/2020    No results found for: "MG" No results  found for: "VD25OH"  No results found for: "PREALBUMIN"    Latest Ref Rng & Units 07/16/2022    8:46 AM 07/10/2021   10:31 AM 01/18/2020    2:43 PM  CBC EXTENDED  WBC 4.0 - 10.5 K/uL 6.2  6.2  7.8   RBC 4.22 - 5.81 Mil/uL 4.75  4.46  4.74   Hemoglobin 13.0 - 17.0 g/dL 19.1  47.8  29.5   HCT 39.0 - 52.0 % 43.7  40.1  41.9   Platelets 150.0 - 400.0 K/uL 168.0  164.0  194.0   NEUT# 1.4 - 7.7 K/uL   4.8   Lymph# 0.7 - 4.0 K/uL   2.0      There is no height or weight on file to calculate BMI.  Orders:  Orders Placed This Encounter  Procedures   XR Foot Complete Left   No orders of the defined types were placed in this encounter.    Procedures: No procedures performed  Clinical Data: No additional findings.  ROS:  All other systems negative, except as noted in the HPI. Review of Systems  Objective: Vital Signs: There were no vitals taken for this visit.  Specialty Comments:  No specialty comments available.  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 12/17/2018   Erectile dysfunction 12/17/2018   Neck pain 09/15/2018   Lumbar radiculopathy 10/14/2016   BPH s/p TURP 2020 03/02/2014   Osteoarthritis 07/16/2012   Hypothyroidism s/p thyroidectomy 1990 due to thyroid cancer 12/26/2008   Dyslipidemia 12/26/2008   Past Medical History:  Diagnosis Date   Actinic keratosis 12/16/2010   Qualifier: Diagnosis of  By: Amador Cunas  MD, Janett Labella    BPH (benign prostatic hyperplasia)    Cancer (HCC)    thyroid cancer 1990   Cataract    both eyes   History of radiation therapy 1990   HYPERLIPIDEMIA 12/26/2008   Hypertension    HYPOTHYROIDISM 12/26/2008   PPD positive    Thoracic aortic aneurysm (HCC)    THYROID CANCER, HX OF 1990   THYROID CANCER, HX OF 12/26/2008    Qualifier: Diagnosis of  By: Amador Cunas  MD, Janett Labella     Family History  Problem Relation Age of Onset   Colon cancer Neg Hx     Past Surgical History:  Procedure Laterality Date   CATARACT EXTRACTION, BILATERAL  2020   COLONOSCOPY     10-ye ago-normal at [facility]   EYE SURGERY     as child   HAND SURGERY     right hand trigger figger    INGUINAL HERNIA REPAIR Right 02/25/2022   Procedure: OPEN RIGHT INGUINAL HERNIA REPAIR WITH MESH;  Surgeon: Kinsinger, De Blanch, MD;  Location: [facility] [place];  Service: General;  Laterality: Right;   neckotomy  1990   20 lymph nodes removed from left neck   TOTAL THYROIDECTOMY  1990   TRANSURETHRAL RESECTION OF PROSTATE N/A 01/21/2019   Procedure: TRANSURETHRAL RESECTION OF THE PROSTATE (TURP);  Surgeon: Sebastian Ache, MD;  Location: [facility];  Service: Urology;  Laterality: N/A;  46 MINS   Social History   Occupational History   Not on file  Tobacco Use   Smoking status: Former    Current packs/day: 0.00    Types: Cigarettes, Pipe    Quit date: 11/17/1968    Years since quitting: 54.6   Smokeless tobacco: Never  Vaping Use   Vaping status: Never Used  Substance and Sexual Activity   Alcohol use: Yes    Alcohol/week: 0.0 standard drinks of alcohol    Comment: occas. beer   Drug use: No   Sexual activity: Not on file

## 2023-07-02 ENCOUNTER — Encounter (INDEPENDENT_AMBULATORY_CARE_PROVIDER_SITE_OTHER): Payer: Self-pay

## 2023-07-02 DIAGNOSIS — M79642 Pain in left hand: Secondary | ICD-10-CM | POA: Diagnosis not present

## 2023-07-02 DIAGNOSIS — M79641 Pain in right hand: Secondary | ICD-10-CM | POA: Diagnosis not present

## 2023-07-13 DIAGNOSIS — M47816 Spondylosis without myelopathy or radiculopathy, lumbar region: Secondary | ICD-10-CM | POA: Diagnosis not present

## 2023-07-15 DIAGNOSIS — L578 Other skin changes due to chronic exposure to nonionizing radiation: Secondary | ICD-10-CM | POA: Diagnosis not present

## 2023-07-15 DIAGNOSIS — Z86006 Personal history of melanoma in-situ: Secondary | ICD-10-CM | POA: Diagnosis not present

## 2023-07-15 DIAGNOSIS — Z08 Encounter for follow-up examination after completed treatment for malignant neoplasm: Secondary | ICD-10-CM | POA: Diagnosis not present

## 2023-07-15 DIAGNOSIS — D225 Melanocytic nevi of trunk: Secondary | ICD-10-CM | POA: Diagnosis not present

## 2023-07-15 DIAGNOSIS — L814 Other melanin hyperpigmentation: Secondary | ICD-10-CM | POA: Diagnosis not present

## 2023-07-15 DIAGNOSIS — L821 Other seborrheic keratosis: Secondary | ICD-10-CM | POA: Diagnosis not present

## 2023-07-15 DIAGNOSIS — L570 Actinic keratosis: Secondary | ICD-10-CM | POA: Diagnosis not present

## 2023-07-20 ENCOUNTER — Encounter: Payer: Medicare HMO | Admitting: Family Medicine

## 2023-07-24 ENCOUNTER — Encounter: Payer: Self-pay | Admitting: Family Medicine

## 2023-07-24 ENCOUNTER — Ambulatory Visit (INDEPENDENT_AMBULATORY_CARE_PROVIDER_SITE_OTHER): Payer: Medicare HMO | Admitting: Family Medicine

## 2023-07-24 VITALS — BP 132/78 | HR 55 | Temp 97.5°F | Ht 70.0 in | Wt 197.0 lb

## 2023-07-24 DIAGNOSIS — M545 Low back pain, unspecified: Secondary | ICD-10-CM

## 2023-07-24 DIAGNOSIS — N400 Enlarged prostate without lower urinary tract symptoms: Secondary | ICD-10-CM | POA: Diagnosis not present

## 2023-07-24 DIAGNOSIS — E785 Hyperlipidemia, unspecified: Secondary | ICD-10-CM

## 2023-07-24 DIAGNOSIS — E039 Hypothyroidism, unspecified: Secondary | ICD-10-CM

## 2023-07-24 DIAGNOSIS — J309 Allergic rhinitis, unspecified: Secondary | ICD-10-CM | POA: Diagnosis not present

## 2023-07-24 DIAGNOSIS — I10 Essential (primary) hypertension: Secondary | ICD-10-CM | POA: Diagnosis not present

## 2023-07-24 DIAGNOSIS — M159 Polyosteoarthritis, unspecified: Secondary | ICD-10-CM | POA: Diagnosis not present

## 2023-07-24 DIAGNOSIS — G8929 Other chronic pain: Secondary | ICD-10-CM | POA: Diagnosis not present

## 2023-07-24 DIAGNOSIS — Z0000 Encounter for general adult medical examination without abnormal findings: Secondary | ICD-10-CM | POA: Diagnosis not present

## 2023-07-24 LAB — COMPREHENSIVE METABOLIC PANEL
ALT: 22 U/L (ref 0–53)
AST: 25 U/L (ref 0–37)
Albumin: 4.1 g/dL (ref 3.5–5.2)
Alkaline Phosphatase: 66 U/L (ref 39–117)
BUN: 23 mg/dL (ref 6–23)
CO2: 27 meq/L (ref 19–32)
Calcium: 9.2 mg/dL (ref 8.4–10.5)
Chloride: 104 meq/L (ref 96–112)
Creatinine, Ser: 1.13 mg/dL (ref 0.40–1.50)
GFR: 62.28 mL/min (ref >60.00)
Glucose, Bld: 91 mg/dL (ref 70–99)
Potassium: 4.4 meq/L (ref 3.5–5.1)
Sodium: 139 meq/L (ref 135–145)
Total Bilirubin: 0.9 mg/dL (ref 0.2–1.2)
Total Protein: 6.5 g/dL (ref 6.0–8.3)

## 2023-07-24 LAB — CBC
HCT: 41 % (ref 39.0–52.0)
Hemoglobin: 13.4 g/dL (ref 13.0–17.0)
MCHC: 32.7 g/dL (ref 30.0–36.0)
MCV: 89.3 fl (ref 78.0–100.0)
Platelets: 174 10*3/uL (ref 150.0–400.0)
RBC: 4.59 Mil/uL (ref 4.22–5.81)
RDW: 13.9 % (ref 11.5–15.5)
WBC: 5.8 10*3/uL (ref 4.0–10.5)

## 2023-07-24 LAB — HEMOGLOBIN A1C: Hgb A1c MFr Bld: 6.2 % (ref 4.6–6.5)

## 2023-07-24 LAB — TSH: TSH: 1.13 u[IU]/mL (ref 0.35–5.50)

## 2023-07-24 LAB — PSA: PSA: 0.09 ng/mL — ABNORMAL LOW (ref 0.10–4.00)

## 2023-07-24 NOTE — Assessment & Plan Note (Signed)
Blood pressure at goal on losartan 100 mg daily.

## 2023-07-24 NOTE — Assessment & Plan Note (Signed)
On Lipitor 40 mg daily.  Check labs. 

## 2023-07-24 NOTE — Assessment & Plan Note (Signed)
Check TSH. On synthroid 172mg daily.

## 2023-07-24 NOTE — Assessment & Plan Note (Signed)
Follows with urology on finasteride 5 mg daily.

## 2023-07-24 NOTE — Patient Instructions (Addendum)
It was very nice to see you today!  We will check blood work today.  Please schedule your colonoscopy soon.   Return in about 1 year (around 07/23/2024) for Annual Physical.   Take care, Dr Jimmey Ralph  PLEASE NOTE:  If you had any lab tests, please let us know if you have not heard back within a few days. You may see your results on mychart before we have a chance to review them but we will give you a call once they are reviewed by [place].   If we ordered any referrals today, please let us know if you have not heard from their office within the next week.   If you had any urgent prescriptions sent in today, please check with the pharmacy within an hour of our visit to make sure the prescription was transmitted appropriately.   Please try these tips to maintain a healthy lifestyle:  Eat at least 3 REAL meals and 1-2 snacks per day.  Aim for no more than 5 hours between eating.  If you eat breakfast, please do so within one hour of getting up.   Each meal should contain half fruits/vegetables, one quarter protein, and one quarter carbs (no bigger than a computer mouse)  Cut down on sweet beverages. This includes juice, soda, and sweet tea.   Drink at least 1 glass of water with each meal and aim for at least 8 glasses per day  Exercise at least 150 minutes every week.    Preventive Care 33 Years and Older, Male Preventive care refers to lifestyle choices and visits with your health care provider that can promote health and wellness. Preventive care visits are also called wellness exams. What can I expect for my preventive care visit? Counseling During your preventive care visit, your health care provider may ask about your: Medical history, including: Past medical problems. Family medical history. History of falls. Current health, including: Emotional well-being. Home life and relationship well-being. Sexual activity. Memory and ability to understand (cognition). Lifestyle,  including: Alcohol, nicotine or tobacco, and drug use. Access to firearms. Diet, exercise, and sleep habits. Work and work Astronomer. Sunscreen use. Safety issues such as seatbelt and bike helmet use. Physical exam Your health care provider will check your: Height and weight. These may be used to calculate your BMI (body mass index). BMI is a measurement that tells if you are at a healthy weight. Waist circumference. This measures the distance around your waistline. This measurement also tells if you are at a healthy weight and may help predict your risk of certain diseases, such as type 2 diabetes and high blood pressure. Heart rate and blood pressure. Body temperature. Skin for abnormal spots. What immunizations do I need?  Vaccines are usually given at various ages, according to a schedule. Your health care provider will recommend vaccines for you based on your age, medical history, and lifestyle or other factors, such as travel or where you work. What tests do I need? Screening Your health care provider may recommend screening tests for certain conditions. This may include: Lipid and cholesterol levels. Diabetes screening. This is done by checking your blood sugar (glucose) after you have not eaten for a while (fasting). Hepatitis C test. Hepatitis B test. HIV (human immunodeficiency virus) test. STI (sexually transmitted infection) testing, if you are at risk. Lung cancer screening. Colorectal cancer screening. Prostate cancer screening. Abdominal aortic aneurysm (AAA) screening. You may need this if you are a current or former smoker. Talk with  your health care provider about your test results, treatment options, and if necessary, the need for more tests. Follow these instructions at home: Eating and drinking  Eat a diet that includes fresh fruits and vegetables, whole grains, lean protein, and low-fat dairy products. Limit your intake of foods with high amounts of sugar,  saturated fats, and salt. Take vitamin and mineral supplements as recommended by your health care provider. Do not drink alcohol if your health care provider tells you not to drink. If you drink alcohol: Limit how much you have to 0-2 drinks a day. Know how much alcohol is in your drink. In the [place] one drink equals one 12 oz bottle of beer (355 mL), one 5 oz glass of wine (148 mL), or one 1 oz glass of hard liquor (44 mL). Lifestyle Brush your teeth every morning and night with fluoride toothpaste. Floss one time each day. Exercise for at least 30 minutes 5 or more days each week. Do not use any products that contain nicotine or tobacco. These products include cigarettes, chewing tobacco, and vaping devices, such as e-cigarettes. If you need help quitting, ask your health care provider. Do not use drugs. If you are sexually active, practice safe sex. Use a condom or other form of protection to prevent STIs. Take aspirin only as told by your health care provider. Make sure that you understand how much to take and what form to take. Work with your health care provider to find out whether it is safe and beneficial for you to take aspirin daily. Ask your health care provider if you need to take a cholesterol-lowering medicine (statin). Find healthy ways to manage stress, such as: Meditation, yoga, or listening to music. Journaling. Talking to a trusted person. Spending time with friends and family. Safety Always wear your seat belt while driving or riding in a vehicle. Do not drive: If you have been drinking alcohol. Do not ride with someone who has been drinking. When you are tired or distracted. While texting. If you have been using any mind-altering substances or drugs. Wear a helmet and other protective equipment during sports activities. If you have firearms in your house, make sure you follow all gun safety procedures. Minimize exposure to UV radiation to reduce your risk of skin  cancer. What's next? Visit your health care provider once a year for an annual wellness visit. Ask your health care provider how often you should have your eyes and teeth checked. Stay up to date on all vaccines. This information is not intended to replace advice given to you by your health care provider. Make sure you discuss any questions you have with your health care provider. Document Revised: 05/01/2021 Document Reviewed: 05/01/2021 Elsevier Patient Education  2024 [employer].

## 2023-07-24 NOTE — Assessment & Plan Note (Signed)
On meloxicam and Voltaren gel as needed. Follows with orthopedics.

## 2023-07-24 NOTE — Progress Notes (Signed)
Chief Complaint:  Carlos David is a 77 y.o. male who presents today for his annual comprehensive physical exam.    Assessment/Plan:  Chronic Problems Addressed Today: Hypothyroidism s/p thyroidectomy 1990 due to thyroid cancer Check TSH. On synthroid daily.   Dyslipidemia On Lipitor 40 mg daily. Check labs.   Osteoarthritis On meloxicam and Voltaren gel as needed. Follows with orthopedics.   BPH s/p TURP 2020 Follows with urology on finasteride 5 mg daily.   Essential hypertension Blood pressure at goal on losartan 100 mg daily.   Preventative Healthcare: Due for colonoscopy this year. He will call to schedule. Check labs today.   Patient Counseling(The following topics were reviewed and/or handout was given):  -Nutrition: Stressed importance of moderation in sodium/caffeine intake, saturated fat and cholesterol, caloric balance, sufficient intake of fresh fruits, vegetables, and fiber.  -Stressed the importance of regular exercise.   -Substance Abuse: Discussed cessation/primary prevention of tobacco, alcohol, or other drug use; driving or other dangerous activities under the influence; availability of treatment for abuse.   -Injury prevention: Discussed safety belts, safety helmets, smoke detector, smoking near bedding or upholstery.   -Sexuality: Discussed sexually transmitted diseases, partner selection, use of condoms, avoidance of unintended pregnancy and contraceptive alternatives.   -Dental health: Discussed importance of regular tooth brushing, flossing, and dental visits.  -Health maintenance and immunizations reviewed. Please refer to Health maintenance section.  Return to care in 1 year for next preventative visit.     Subjective:  HPI:  He has no acute complaints today.   Lifestyle Diet: Balanced. Plenty of fruits and vegetables.  Exercise: Busy with yard work around [employer].      07/24/2023    8:00 AM  Depression screen PHQ 2/9  Decreased  Interest 0  Down, Depressed, Hopeless 0  PHQ - 2 Score 0    Health Maintenance Due  Topic Date Due   Zoster Vaccines- Shingrix  Never done   Colonoscopy  10/01/2020     ROS: Per HPI, otherwise a complete review of systems was negative.   PMH:  The following were reviewed and entered/updated in epic: Past Medical History:  Diagnosis Date   Actinic keratosis 12/16/2010   Qualifier: Diagnosis of  By: Amador Cunas  MD, Janett Labella    BPH (benign prostatic hyperplasia)    Cancer [facility])    thyroid cancer 1990   Cataract    both eyes   History of radiation therapy 1990   HYPERLIPIDEMIA 12/26/2008   Hypertension    HYPOTHYROIDISM 12/26/2008   PPD positive    Thoracic aortic aneurysm (HCC)    THYROID CANCER, HX OF 1990   THYROID CANCER, HX OF 12/26/2008   Qualifier: Diagnosis of  By: Amador Cunas  MD, Janett Labella    Patient Active Problem List   Diagnosis Date Noted   Allergic rhinitis 08/08/2022   Carpal tunnel syndrome 03/06/2020   Essential hypertension 12/17/2018   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 12/17/2018   Erectile dysfunction 12/17/2018   Neck pain 09/15/2018   Lumbar radiculopathy 10/14/2016   BPH s/p TURP 2020 03/02/2014   Osteoarthritis 07/16/2012   Hypothyroidism s/p thyroidectomy 1990 due to thyroid cancer 12/26/2008   Dyslipidemia 12/26/2008   Past Surgical History:  Procedure Laterality Date   CATARACT EXTRACTION, BILATERAL  2020   COLONOSCOPY     10-ye ago-normal at [facility]   EYE SURGERY     as child   HAND SURGERY  right hand trigger figger    INGUINAL HERNIA REPAIR Right 02/25/2022   Procedure: OPEN RIGHT INGUINAL HERNIA REPAIR WITH MESH;  Surgeon: Kinsinger, De Blanch, MD;  Location: [facility];  Service: General;  Laterality: Right;   neckotomy  1990   20 lymph nodes removed from left neck   TOTAL THYROIDECTOMY  1990   TRANSURETHRAL RESECTION OF PROSTATE N/A 01/21/2019   Procedure: TRANSURETHRAL  RESECTION OF THE PROSTATE (TURP);  Surgeon: Sebastian Ache, MD;  Location: [facility];  Service: Urology;  Laterality: N/A;  64 MINS    Family History  Problem Relation Age of Onset   Colon cancer Neg Hx     Medications- reviewed and updated Current Outpatient Medications  Medication Sig Dispense Refill   aspirin EC 81 MG tablet Take 1 tablet (81 mg total) by mouth daily. Swallow whole. 90 tablet 3   atorvastatin (LIPITOR) 40 MG tablet Take 1 tablet (40 mg total) by mouth daily. 90 tablet 3   ezetimibe (ZETIA) 10 MG tablet Take 1 tablet (10 mg total) by mouth daily. 90 tablet 3   finasteride (PROSCAR) 5 MG tablet TAKE 1 TABLET BY MOUTH EVERY DAY 90 tablet 1   gabapentin (NEURONTIN) 300 MG capsule Take 1 capsule by mouth 2 (two) times daily.     hydrocortisone 2.5 % cream      levothyroxine (SYNTHROID) 137 MCG tablet TAKE 1 TABLET BY MOUTH EVERY DAY 90 tablet 1   losartan (COZAAR) 100 MG tablet TAKE 1 TABLET BY MOUTH EVERY DAY 90 tablet 3   meloxicam (MOBIC) 15 MG tablet Take 1 tablet (15 mg total) by mouth as needed. 30 tablet 0   No current facility-administered medications for this visit.    Allergies-reviewed and updated Allergies  Allergen Reactions   Penicillins Hives    Social History   Socioeconomic History   Marital status: Married    Spouse name: Not on file   Number of children: Not on file   Years of education: Not on file   Highest education level: Not on file  Occupational History   Not on file  Tobacco Use   Smoking status: Former    Current packs/day: 0.00    Types: Cigarettes, Pipe    Quit date: 11/17/1968    Years since quitting: 54.7   Smokeless tobacco: Never  Vaping Use   Vaping status: Never Used  Substance and Sexual Activity   Alcohol use: Yes    Alcohol/week: 0.0 standard drinks of alcohol    Comment: occas. beer   Drug use: No   Sexual activity: Not on file  Other Topics Concern   Not on file  Social History Narrative    Not on file   Social Determinants of Health   Financial Resource Strain: Low Risk  (03/03/2023)   Overall Financial Resource Strain (CARDIA)    Difficulty of Paying Living Expenses: Not hard at all  Food Insecurity: No Food Insecurity (03/03/2023)   Hunger Vital Sign    Worried About Running Out of Food in the Last Year: Never true    Ran Out of Food in the Last Year: Never true  Transportation Needs: No Transportation Needs (03/03/2023)   PRAPARE - Administrator, Civil Service (Medical): No    Lack of Transportation (Non-Medical): No  Physical Activity: Inactive (03/03/2023)   Exercise Vital Sign    Days of Exercise per Week: 0 days    Minutes of Exercise per Session: 0 min  Stress: No Stress Concern Present (03/03/2023)   [employer] of Occupational Health - Occupational Stress Questionnaire    Feeling of Stress : Not at all  Social Connections: Moderately Integrated (03/03/2023)   Social Connection and Isolation Panel [NHANES]    Frequency of Communication with Friends and Family: More than three times a week    Frequency of Social Gatherings with Friends and Family: More than three times a week    Attends Religious Services: More than 4 times per year    Active Member of [employer] or Organizations: No    Attends Banker Meetings: Never    Marital Status: Married        Objective:  Physical Exam: BP 132/78   Pulse (!) 55   Temp (!) 97.5 F (36.4 C) (Temporal)   Ht 5\' 10"  (1.778 m)   Wt 197 lb (89.4 kg)   SpO2 96%   BMI 28.27 kg/m   Body mass index is 28.27 kg/m. Wt Readings from Last 3 Encounters:  07/24/23 197 lb (89.4 kg)  05/04/23 200 lb 3.2 oz (90.8 kg)  03/31/23 203 lb 4 oz (92.2 kg)   Gen: NAD, resting comfortably HEENT: TMs normal bilaterally. OP clear. No thyromegaly noted.  CV: RRR with no murmurs appreciated Pulm: NWOB, CTAB with no crackles, wheezes, or rhonchi GI: Normal bowel sounds present. Soft, Nontender,  Nondistended. MSK: no edema, cyanosis, or clubbing noted Skin: warm, dry Neuro: CN2-12 grossly intact. Strength 5/5 in upper and lower extremities. Reflexes symmetric and intact bilaterally.  Psych: Normal affect and thought content     Arif Amendola M. Jimmey Ralph, MD 07/24/2023 8:18 AM

## 2023-07-28 NOTE — Progress Notes (Signed)
Blood sugar is borderline but stable.  Everything else is normal.  Do not need to make any changes to treatment plan.  We can recheck everything in a year.

## 2023-08-20 DIAGNOSIS — M47816 Spondylosis without myelopathy or radiculopathy, lumbar region: Secondary | ICD-10-CM | POA: Diagnosis not present

## 2023-09-15 ENCOUNTER — Other Ambulatory Visit: Payer: Self-pay | Admitting: Cardiology

## 2023-09-22 ENCOUNTER — Other Ambulatory Visit: Payer: Self-pay | Admitting: Cardiology

## 2023-10-07 ENCOUNTER — Encounter: Payer: Self-pay | Admitting: Internal Medicine

## 2023-10-07 DIAGNOSIS — H02834 Dermatochalasis of left upper eyelid: Secondary | ICD-10-CM | POA: Diagnosis not present

## 2023-10-07 DIAGNOSIS — H02831 Dermatochalasis of right upper eyelid: Secondary | ICD-10-CM | POA: Diagnosis not present

## 2023-10-07 DIAGNOSIS — H5000 Unspecified esotropia: Secondary | ICD-10-CM | POA: Diagnosis not present

## 2023-10-07 DIAGNOSIS — H5021 Vertical strabismus, right eye: Secondary | ICD-10-CM | POA: Diagnosis not present

## 2023-10-07 DIAGNOSIS — D3131 Benign neoplasm of right choroid: Secondary | ICD-10-CM | POA: Diagnosis not present

## 2023-10-07 DIAGNOSIS — H524 Presbyopia: Secondary | ICD-10-CM | POA: Diagnosis not present

## 2023-10-14 ENCOUNTER — Ambulatory Visit: Payer: Medicare HMO | Admitting: Orthopedic Surgery

## 2023-10-14 ENCOUNTER — Other Ambulatory Visit (INDEPENDENT_AMBULATORY_CARE_PROVIDER_SITE_OTHER): Payer: Medicare HMO

## 2023-10-14 ENCOUNTER — Encounter: Payer: Self-pay | Admitting: Orthopedic Surgery

## 2023-10-14 VITALS — Ht 70.0 in | Wt 195.0 lb

## 2023-10-14 DIAGNOSIS — M25561 Pain in right knee: Secondary | ICD-10-CM | POA: Diagnosis not present

## 2023-10-14 DIAGNOSIS — M1711 Unilateral primary osteoarthritis, right knee: Secondary | ICD-10-CM | POA: Diagnosis not present

## 2023-10-14 MED ORDER — LIDOCAINE HCL 1 % IJ SOLN
5.0000 mL | INTRAMUSCULAR | Status: AC | PRN
Start: 2023-10-14 — End: 2023-10-14
  Administered 2023-10-14: 5 mL

## 2023-10-14 MED ORDER — BUPIVACAINE HCL 0.25 % IJ SOLN
4.0000 mL | INTRAMUSCULAR | Status: AC | PRN
Start: 2023-10-14 — End: 2023-10-14
  Administered 2023-10-14: 4 mL via INTRA_ARTICULAR

## 2023-10-14 MED ORDER — METHYLPREDNISOLONE ACETATE 40 MG/ML IJ SUSP
40.0000 mg | INTRAMUSCULAR | Status: AC | PRN
Start: 2023-10-14 — End: 2023-10-14
  Administered 2023-10-14: 40 mg via INTRA_ARTICULAR

## 2023-10-14 NOTE — Progress Notes (Signed)
Office Visit Note   Patient: Carlos David           Date of Birth: 1946/09/06           MRN: [id number] Visit Date: 10/14/2023 Requested by: Ardith Dark, MD [address] [place],  [place] [address] PCP: Ardith Dark, MD  Subjective: Chief Complaint  Patient presents with   Right Knee - Pain    HPI: Carlos David is a 77 y.o. male who presents to the office reporting right knee pain and swelling.  This has been going on for several weeks.  He looked after his grandchildren for 5 days in a two-story house which involves going up and down the stairs a lot.  He also worked outside 2 days ago and did a lot of standing with yard work.  He is doing well from the left knee meniscal cyst drainage.  His right knee gives him global pain with superior lateral pain as well.  Takes Tylenol and Mobic as well as uses a brace on that right-hand side.  He also reports significant hand arthritis.  This has been seen before by a hand surgeon who recommended no surgical intervention..                ROS: All systems reviewed are negative as they relate to the chief complaint within the history of present illness.  Patient denies fevers or chills.  Assessment & Plan: Visit Diagnoses:  1. Acute pain of right knee   2. Arthritis of right knee     Plan: Impression is right sided knee pain with effusion which statistically represents degenerative meniscal tear versus worsening arthritis.  Aspiration and injection performed today.  6-week return decision for or against repeat injection.  Radiographs do show mild to moderate arthritis in the right knee.  This does not look like a total knee replacement type of knee.  Follow-Up Instructions: No follow-ups on file.   Orders:  Orders Placed This Encounter  Procedures   XR KNEE 3 VIEW RIGHT   No orders of the defined types were placed in this encounter.     Procedures: Large Joint Inj: R knee on 10/14/2023 9:44 PM Indications: diagnostic  evaluation, joint swelling and pain Details: 18 G 1.5 in needle, superolateral approach  Arthrogram: No  Medications: 5 mL lidocaine 1 %; 40 mg methylPREDNISolone acetate 40 MG/ML; 4 mL bupivacaine 0.25 % Outcome: tolerated well, no immediate complications Procedure, treatment alternatives, risks and benefits explained, specific risks discussed. Consent was given by the patient. Immediately prior to procedure a time out was called to verify the correct patient, procedure, equipment, support staff and site/side marked as required. Patient was prepped and draped in the usual sterile fashion.       Clinical Data: No additional findings.  Objective: Vital Signs: Ht 5\' 10"  (1.778 m)   Wt 195 lb (88.5 kg)   BMI 27.98 kg/m   Physical Exam:  Constitutional: Patient appears well-developed HEENT:  Head: Normocephalic Eyes:EOM are normal Neck: Normal range of motion Cardiovascular: Normal rate Pulmonary/chest: Effort normal Neurologic: Patient is alert Skin: Skin is warm Psychiatric: Patient has normal mood and affect  Ortho Exam: Ortho exam demonstrates slightly antalgic gait to the right.  Lacks about 2 to 3 degrees of full extension on the right-hand side.  Mild effusion is present.  Pedal pulses palpable.  No groin pain with internal/external Tatian of the leg.  Extensor mechanism intact.  Has medial and lateral joint  line tenderness with equivocal McMurray compression testing on both sides.  Specialty Comments:  No specialty comments available.  Imaging: No results found.   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 12/17/2018   Erectile dysfunction 12/17/2018   Neck pain 09/15/2018   Lumbar radiculopathy 10/14/2016   BPH s/p TURP 2020 03/02/2014   Osteoarthritis 07/16/2012    Hypothyroidism s/p thyroidectomy 1990 due to thyroid cancer 12/26/2008   Dyslipidemia 12/26/2008   Past Medical History:  Diagnosis Date   Actinic keratosis 12/16/2010   Qualifier: Diagnosis of  By: Amador Cunas  MD, Janett Labella    BPH (benign prostatic hyperplasia)    Cancer (HCC)    thyroid cancer 1990   Cataract    both eyes   History of radiation therapy 1990   HYPERLIPIDEMIA 12/26/2008   Hypertension    HYPOTHYROIDISM 12/26/2008   PPD positive    Thoracic aortic aneurysm (HCC)    THYROID CANCER, HX OF 1990   THYROID CANCER, HX OF 12/26/2008   Qualifier: Diagnosis of  By: Amador Cunas  MD, Janett Labella     Family History  Problem Relation Age of Onset   Colon cancer Neg Hx     Past Surgical History:  Procedure Laterality Date   CATARACT EXTRACTION, BILATERAL  2020   COLONOSCOPY     10-ye ago-normal at [facility]   EYE SURGERY     as child   HAND SURGERY     right hand trigger figger    INGUINAL HERNIA REPAIR Right 02/25/2022   Procedure: OPEN RIGHT INGUINAL HERNIA REPAIR WITH MESH;  Surgeon: Kinsinger, De Blanch, MD;  Location: [facility];  Service: General;  Laterality: Right;   neckotomy  1990   20 lymph nodes removed from left neck   TOTAL THYROIDECTOMY  1990   TRANSURETHRAL RESECTION OF PROSTATE N/A 01/21/2019   Procedure: TRANSURETHRAL RESECTION OF THE PROSTATE (TURP);  Surgeon: Sebastian Ache, MD;  Location: [facility];  Service: Urology;  Laterality: N/A;  59 MINS   Social History   Occupational History   Not on file  Tobacco Use   Smoking status: Former    Current packs/day: 0.00    Types: Cigarettes, Pipe    Quit date: 11/17/1968    Years since quitting: 54.9   Smokeless tobacco: Never  Vaping Use   Vaping status: Never Used  Substance and Sexual Activity   Alcohol use: Yes    Alcohol/week: 0.0 standard drinks of alcohol    Comment: occas. beer   Drug use: No   Sexual activity: Not on file

## 2023-10-17 ENCOUNTER — Ambulatory Visit
Admission: RE | Admit: 2023-10-17 | Discharge: 2023-10-17 | Disposition: A | Discharge status: Home / Self Care | Payer: Medicare HMO | Source: Ambulatory Visit | Attending: Emergency Medicine | Admitting: Emergency Medicine

## 2023-10-17 VITALS — BP 162/94 | HR 60 | Temp 97.6°F | Resp 16

## 2023-10-17 DIAGNOSIS — J069 Acute upper respiratory infection, unspecified: Secondary | ICD-10-CM | POA: Diagnosis not present

## 2023-10-17 MED ORDER — BENZONATATE 100 MG PO CAPS: 100.0000 mg | ORAL_CAPSULE | Freq: Three times a day (TID) | ORAL | 0 refills | Status: DC | PRN

## 2023-10-17 NOTE — Discharge Instructions (Addendum)
Continue the tylenol cold & flu if helpful Drink lots of water! The tessalon cough pills can be taken 3x daily. If this medication makes you drowsy, take only one pill before bed. You may have a few more days of symptoms  Please return if needed

## 2023-10-17 NOTE — ED Triage Notes (Signed)
Pt presents to UC w/ c/o cough, upset stomach x4 days. Home remedies: Tylenol cold and flu, zycam.

## 2023-10-17 NOTE — ED Provider Notes (Signed)
[facility]    CSN: [id number] Arrival date & time: 10/17/23  1027      History   Chief Complaint Chief Complaint  Patient presents with   Cough    Cough and stomach aches - Entered by patient    HPI Carlos David is a 77 y.o. male.  4-5 day history of slight cough and some nausea Feels he has to clear his throat. No production with cough No vomiting or diarrhea. Not having abdominal pain. More of a discomfort. Tolerating fluids, good appetite  Not having fever Using tylenol cold & flu with improvement Today feeling better  Possible sick contacts No recent travel   Past Medical History:  Diagnosis Date   Actinic keratosis 12/16/2010   Qualifier: Diagnosis of  By: Amador Cunas  MD, Janett Labella    BPH (benign prostatic hyperplasia)    Cancer (HCC)    thyroid cancer 1990   Cataract    both eyes   History of radiation therapy 1990   HYPERLIPIDEMIA 12/26/2008   Hypertension    HYPOTHYROIDISM 12/26/2008   PPD positive    Thoracic aortic aneurysm (HCC)    THYROID CANCER, HX OF 1990   THYROID CANCER, HX OF 12/26/2008   Qualifier: Diagnosis of  By: Amador Cunas  MD, Janett Labella     Patient Active Problem List   Diagnosis Date Noted   Allergic rhinitis 08/08/2022   Carpal tunnel syndrome 03/06/2020   Essential hypertension 12/17/2018   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 12/17/2018   Erectile dysfunction 12/17/2018   Neck pain 09/15/2018   Lumbar radiculopathy 10/14/2016   BPH s/p TURP 2020 03/02/2014   Osteoarthritis 07/16/2012   Hypothyroidism s/p thyroidectomy 1990 due to thyroid cancer 12/26/2008   Dyslipidemia 12/26/2008    Past Surgical History:  Procedure Laterality Date   CATARACT EXTRACTION, BILATERAL  2020   COLONOSCOPY     10-ye ago-normal at [facility]   EYE SURGERY     as child   HAND SURGERY     right hand trigger figger    INGUINAL HERNIA REPAIR Right 02/25/2022   Procedure: OPEN RIGHT INGUINAL HERNIA REPAIR  WITH MESH;  Surgeon: Kinsinger, De Blanch, MD;  Location: [facility];  Service: General;  Laterality: Right;   neckotomy  1990   20 lymph nodes removed from left neck   TOTAL THYROIDECTOMY  1990   TRANSURETHRAL RESECTION OF PROSTATE N/A 01/21/2019   Procedure: TRANSURETHRAL RESECTION OF THE PROSTATE (TURP);  Surgeon: Sebastian Ache, MD;  Location: [facility];  Service: Urology;  Laterality: N/A;  75 MINS       Home Medications    Prior to Admission medications   Medication Sig Start Date End Date Taking? Authorizing Provider  benzonatate (TESSALON) 100 MG capsule Take 1 capsule (100 mg total) by mouth 3 (three) times daily as needed for cough. 10/17/23  Yes Tydarius Yawn, Ray Church  aspirin EC 81 MG tablet Take 1 tablet (81 mg total) by mouth daily. Swallow whole. 07/30/21   Little Ishikawa, MD  atorvastatin (LIPITOR) 40 MG tablet Take 1 tablet (40 mg total) by mouth daily. 12/23/22   Little Ishikawa, MD  ezetimibe (ZETIA) 10 MG tablet TAKE 1 TABLET BY MOUTH EVERY DAY 09/22/23   Little Ishikawa, MD  finasteride (PROSCAR) 5 MG tablet TAKE 1 TABLET BY MOUTH EVERY DAY 10/15/16   Gordy Savers, MD  gabapentin (NEURONTIN) 300 MG capsule Take 1 capsule by  mouth 2 (two) times daily. 05/21/21   [provider]  hydrocortisone 2.5 % cream  06/05/20   [provider]  levothyroxine (SYNTHROID) 137 MCG tablet TAKE 1 TABLET BY MOUTH EVERY DAY 04/14/23   Ardith Dark, MD  losartan (COZAAR) 100 MG tablet TAKE 1 TABLET BY MOUTH EVERY DAY 09/16/23   Little Ishikawa, MD  meloxicam (MOBIC) 15 MG tablet Take 1 tablet (15 mg total) by mouth as needed. 07/30/21   Little Ishikawa, MD    Family History Family History  Problem Relation Age of Onset   Colon cancer Neg Hx     Social History Social History   Tobacco Use   Smoking status: Former    Current packs/day: 0.00    Types: Cigarettes, Pipe    Quit date:  11/17/1968    Years since quitting: 54.9   Smokeless tobacco: Never  Vaping Use   Vaping status: Never Used  Substance Use Topics   Alcohol use: Yes    Alcohol/week: 0.0 standard drinks of alcohol    Comment: occas. beer   Drug use: No     Allergies   Penicillins   Review of Systems Review of Systems  Respiratory:  Positive for cough.    Per HPI  Physical Exam Triage Vital Signs ED Triage Vitals  Encounter Vitals Group     BP      Systolic BP Percentile      Diastolic BP Percentile      Pulse      Resp      Temp      Temp src      SpO2      Weight      Height      Head Circumference      Peak Flow      Pain Score      Pain Loc      Pain Education      Exclude from Growth Chart    No data found.  Updated Vital Signs BP (!) 162/94 (BP Location: Right Arm)   Pulse 60   Temp 97.6 F (36.4 C) (Oral)   Resp 16   SpO2 96%   Visual Acuity Right Eye Distance:   Left Eye Distance:   Bilateral Distance:    Right Eye Near:   Left Eye Near:    Bilateral Near:     Physical Exam Vitals and nursing note reviewed.  Constitutional:      General: He is not in acute distress.    Appearance: He is not ill-appearing.  HENT:     Right Ear: Tympanic membrane and ear canal normal.     Left Ear: Tympanic membrane and ear canal normal.     Nose: No congestion or rhinorrhea.     Mouth/Throat:     Mouth: Mucous membranes are moist.     Pharynx: Oropharynx is clear. No oropharyngeal exudate or posterior oropharyngeal erythema.  Eyes:     Conjunctiva/sclera: Conjunctivae normal.  Cardiovascular:     Rate and Rhythm: Normal rate and regular rhythm.     Pulses: Normal pulses.     Heart sounds: Normal heart sounds.  Pulmonary:     Effort: Pulmonary effort is normal. No respiratory distress.     Breath sounds: Normal breath sounds. No wheezing or rales.  Abdominal:     General: There is no distension.     Palpations: Abdomen is soft. There is no mass.     Tenderness:  There is no abdominal tenderness. There is no guarding.  Musculoskeletal:     Cervical back: Normal range of motion.  Lymphadenopathy:     Cervical: No cervical adenopathy.  Skin:    General: Skin is warm and dry.  Neurological:     Mental Status: He is alert and oriented to person, place, and time.      UC Treatments / Results  Labs (all labs ordered are listed, but only abnormal results are displayed) Labs Reviewed - No data to display  EKG  Radiology No results found.  Procedures Procedures  Medications Ordered in UC Medications - No data to display  Initial Impression / Assessment and Plan / UC Course  I have reviewed the triage vital signs and the nursing notes.  Pertinent labs & imaging results that were available during my care of the patient were reviewed by me and considered in my medical decision making (see chart for details).  Afebrile, well appearing, clear lungs No respiratory distress, patient talking continually in full sentences Overall unremarkable exam Discussed viral etiology and symptomatic care He is already doing better with the OTC Tylenol cold and flu medicine.  Can try Tessalon TID prn.  Advise monitoring symptoms, can return if needed.  No red flags at this time.  Patient is agreeable to plan Defer testing today  Final Clinical Impressions(s) / UC Diagnoses   Final diagnoses:  Viral URI with cough     Discharge Instructions      Continue the tylenol cold & flu if helpful Drink lots of water! The tessalon cough pills can be taken 3x daily. If this medication makes you drowsy, take only one pill before bed. You may have a few more days of symptoms  Please return if needed     ED Prescriptions     Medication Sig Dispense Auth. Provider   benzonatate (TESSALON) 100 MG capsule Take 1 capsule (100 mg total) by mouth 3 (three) times daily as needed for cough. 30 capsule Berry Godsey, Lurena Joiner, PA-C      PDMP not reviewed this  encounter.   Kathrine Haddock 10/17/23 1219

## 2023-10-18 NOTE — Progress Notes (Unsigned)
Cardiology Office Note:    Date:  10/19/2023   ID:  Carlos David, Carlos David 26-May-1946, MRN [id number]  PCP:  Ardith Dark, MD  Cardiologist:  Little Ishikawa, MD  Electrophysiologist:  None   Referring MD: Ardith Dark, MD   Chief Complaint  Patient presents with   Coronary Artery Disease     History of Present Illness:    Carlos David is a 77 y.o. male with a hx of thyroid cancer now hypothyroidism, hyperlipidemia who presents for follow-up.  He was referred by Jarold Motto, PA-C for evaluation of chest pain, initially seen on 01/19/2020.  He reported that he has been having chest pain for the preceding few months.  Occured daily.  Chest pain is located under his left breast.  Describes as pinching pain.  Can last for few minutes.  Typically occurs at rest.  Reports that he exercises at the [facility], will do the elliptical for 30 minutes.  Has not noted exertional chest pain.  Does report some lightheadedness with standing.  Denies any syncope, palpitations, lower extremity edema.  Smoked cigarettes as teenager for few years.  Smoked pipe for 10 years.  Mother had MI in 25s.    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  Lexiscan Myoview on 08/07/2021 showed normal perfusion, EF 51%.  Since last clinic visit, he reports he is doing okay.  Has been having issues with cold recently.  He was seen in urgent care on Saturday.  He denies any chest pain or dyspnea.  Denies any lightheadedness, syncope, lower extremity edema, or palpitations.  Reports he has not been exercising.   Past Medical History:  Diagnosis Date   Actinic keratosis 12/16/2010   Qualifier: Diagnosis of  By:  Amador Cunas  MD, Janett Labella    BPH (benign prostatic hyperplasia)    Cancer [facility])    thyroid cancer 1990   Cataract    both eyes   History of radiation therapy 1990   HYPERLIPIDEMIA 12/26/2008   Hypertension    HYPOTHYROIDISM 12/26/2008   PPD positive    Thoracic aortic aneurysm (HCC)    THYROID CANCER, HX OF 1990   THYROID CANCER, HX OF 12/26/2008   Qualifier: Diagnosis of  By: Amador Cunas  MD, Janett Labella     Past Surgical History:  Procedure Laterality Date   CATARACT EXTRACTION, BILATERAL  2020   COLONOSCOPY     10-ye ago-normal at [facility]   EYE SURGERY     as child   HAND SURGERY     right hand trigger figger    INGUINAL HERNIA REPAIR Right 02/25/2022   Procedure: OPEN RIGHT INGUINAL HERNIA REPAIR WITH MESH;  Surgeon: Kinsinger, De Blanch, MD;  Location: [facility];  Service: General;  Laterality: Right;   neckotomy  1990   20 lymph nodes removed from left neck   TOTAL THYROIDECTOMY  1990   TRANSURETHRAL RESECTION OF PROSTATE N/A 01/21/2019   Procedure: TRANSURETHRAL RESECTION OF THE PROSTATE (TURP);  Surgeon: Sebastian Ache, MD;  Location: [facility];  Service: Urology;  Laterality: N/A;  75 MINS    Current Medications: Current Meds  Medication Sig   aspirin EC 81 MG tablet Take 1 tablet (  81 mg total) by mouth daily. Swallow whole.   atorvastatin (LIPITOR) 40 MG tablet Take 1 tablet (40 mg total) by mouth daily.   benzonatate (TESSALON) 100 MG capsule Take 1 capsule (100 mg total) by mouth 3 (three) times daily as needed for cough.   ezetimibe (ZETIA) 10 MG tablet TAKE 1 TABLET BY MOUTH EVERY DAY   finasteride (PROSCAR) 5 MG tablet TAKE 1 TABLET BY MOUTH EVERY DAY   gabapentin (NEURONTIN) 300 MG capsule Take 1 capsule by mouth 2 (two) times daily.   hydrocortisone 2.5 % cream    levothyroxine (SYNTHROID) 137 MCG tablet TAKE 1 TABLET BY MOUTH EVERY DAY   losartan (COZAAR) 100 MG tablet TAKE 1 TABLET BY MOUTH EVERY DAY   meloxicam (MOBIC) 15  MG tablet Take 1 tablet (15 mg total) by mouth as needed.     Allergies:   Penicillins   Social History   Socioeconomic History   Marital status: Married    Spouse name: Not on file   Number of children: Not on file   Years of education: Not on file   Highest education level: Not on file  Occupational History   Not on file  Tobacco Use   Smoking status: Former    Current packs/day: 0.00    Types: Cigarettes, Pipe    Quit date: 11/17/1968    Years since quitting: 54.9   Smokeless tobacco: Never  Vaping Use   Vaping status: Never Used  Substance and Sexual Activity   Alcohol use: Yes    Alcohol/week: 0.0 standard drinks of alcohol    Comment: occas. beer   Drug use: No   Sexual activity: Not on file  Other Topics Concern   Not on file  Social History Narrative   Not on file   Social Determinants of Health   Financial Resource Strain: Low Risk  (03/03/2023)   Overall Financial Resource Strain (CARDIA)    Difficulty of Paying Living Expenses: Not hard at all  Food Insecurity: No Food Insecurity (03/03/2023)   Hunger Vital Sign    Worried About Running Out of Food in the Last Year: Never true    Ran Out of Food in the Last Year: Never true  Transportation Needs: No Transportation Needs (03/03/2023)   PRAPARE - Administrator, Civil Service (Medical): No    Lack of Transportation (Non-Medical): No  Physical Activity: Inactive (03/03/2023)   Exercise Vital Sign    Days of Exercise per Week: 0 days    Minutes of Exercise per Session: 0 min  Stress: No Stress Concern Present (03/03/2023)   [employer] of Occupational Health - Occupational Stress Questionnaire    Feeling of Stress : Not at all  Social Connections: Moderately Integrated (03/03/2023)   Social Connection and Isolation Panel [NHANES]    Frequency of Communication with Friends and Family: More than three times a week    Frequency of Social Gatherings with Friends and Family: More than three times a  week    Attends Religious Services: More than 4 times per year    Active Member of [employer] or Organizations: No    Attends Banker Meetings: Never    Marital Status: Married     Family History: The patient's family history is negative for Colon cancer.  ROS:   Please see the history of present illness.     All other systems reviewed and are negative.  EKGs/Labs/Other Studies Reviewed:    The following studies were  reviewed today:   EKG:   05/04/2023: Sinus bradycardia, rate 57, poor R wave progression, no ST abnormalities 10/23/2022: Normal sinus rhythm, rate 69, left axis deviation, no ST abnormality 02/06/22: NSR, rate 68. LAD, no ST abnormalities 07/30/2021: Normal sinus rhythm, rate 74, no ST abnormalities 04/22/2021: NSR, rate 65, No ST abnormalities 01/19/2020: normal sinus rhythm, rate 69, left axis deviation, no ST/T abnormalities  Lexiscan Myoview 11/28/16: Nuclear stress EF: 54%. The study is normal. This is a low risk study. The left ventricular ejection fraction is mildly decreased (45-54%). There was no ST segment deviation noted during stress.   Normal resting and stress perfusion. No ischemia or infarction EF 54%   Coronary CTA 03/15/20: IMPRESSION: 1. Coronary calcium score of 526. This was 44 percentile for age and sex matched control.   2. Normal coronary origin with right dominance.   3. CAD-RADS 3. Mild stenosis in the proximal and moderate stenosis in the mid LAD. Consider symptom-guided anti-ischemic pharmacotherapy as well as risk factor modification per guideline directed care. Additional analysis with CT FFR will be submitted.   4. 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 No dissection.   5. Mildly dilated pulmonary artery measuring 33 mm.  1. Left Main: 0.99.   2. LAD: Proximal: 0.96, mid: 0.86, distal: 0.83. 3. LCX: 0.94 4. OM1: 0.96. 5. OM2: 9.97. 6. RCA: Proximal: 0.98, distal: 0.96.   IMPRESSION: 1. CT  FFR analysis didn't show any significant stenosis. Aggressive medical management is recommended.    IMPRESSION: 1.  No acute findings in the imaged extracardiac chest. 2.  Aortic Atherosclerosis (ICD10-I70.0).   TTE 02/06/20:  1. Left ventricular ejection fraction, by estimation, is 60 to 65%. The  left ventricle has normal function. The left ventricle has no regional  wall motion abnormalities. There is mild asymmetric left ventricular  hypertrophy. Left ventricular diastolic  parameters are indeterminate.   2. Right ventricular systolic function is normal. The right ventricular  size is normal.   3. The mitral valve is normal in structure. Mild mitral valve  regurgitation. No evidence of mitral stenosis.   4. The aortic valve is normal in structure. Aortic valve regurgitation is  not visualized. No aortic stenosis is present.   5. Aortic dilatation noted. There is mild to moderate dilatation of the  ascending aorta measuring 44 mm.   CT FFR 03/15/2020: FINDINGS: FFRct analysis was performed on the original cardiac CT angiogram dataset. Diagrammatic representation of the FFRct analysis is provided in a separate PDF document in PACS. This dictation was created using the PDF document and an interactive 3D model of the results. 3D model is not available in the EMR/PACS. Normal FFR range is >0.80.   1. Left Main: 0.99. 2. LAD: Proximal: 0.96, mid: 0.86, distal: 0.83. 3. LCX: 0.94 4. OM1: 0.96. 5. OM2: 9.97. 6. RCA: Proximal: 0.98, distal: 0.96.   IMPRESSION: 1. CT FFR analysis didn't show any significant stenosis. Aggressive medical management is recommended.  CT Angiography Chest 03/13/2021: IMPRESSION: 1. 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 Recommend annual imaging followup by CTA or MRA. This recommendation follows 2010 ACCF/AHA/AATS/ACR/ASA/SCA/SCAI/SIR/STS/SVM Guidelines for the Diagnosis and Management of Patients with  Thoracic Aortic Disease. Circulation. 2010; 121: E952-W413. Aortic aneurysm NOS (ICD10-I71.9) 2. No acute chest abnormality. 3. Small pleural-based nodular density in the right upper lobe measuring roughly 5 mm. This is indeterminate. No follow-up needed if patient is low-risk. Non-contrast chest CT can be considered in  12 months if patient is high-risk. This recommendation follows the consensus statement: Guidelines for Management of Incidental Pulmonary Nodules Detected on CT Images: From the Fleischner Society 2017; Radiology 2017; 284:228-243. 4. Evidence for small tracheal diverticula. Recommend attention on follow-up imaging.  Recent Labs: 07/24/2023: ALT 22; BUN 23; Creatinine, Ser 1.13; Hemoglobin 13.4; Platelets 174.0; Potassium 4.4; Sodium 139; TSH 1.13  Recent Lipid Panel    Component Value Date/Time   CHOL 119 04/30/2023 0824   TRIG 58 04/30/2023 0824   HDL 63 04/30/2023 0824   CHOLHDL 1.9 04/30/2023 0824   CHOLHDL 2 07/16/2022 0846   VLDL 17.4 07/16/2022 0846   LDLCALC 43 04/30/2023 0824    Physical Exam:    VS:  BP 130/74   Pulse 73   Ht 5\' 10"  (1.778 m)   Wt 200 lb (90.7 kg)   SpO2 98%   BMI 28.70 kg/m     Wt Readings from Last 3 Encounters:  10/19/23 200 lb (90.7 kg)  10/14/23 195 lb (88.5 kg)  07/24/23 197 lb (89.4 kg)     GEN:  Well nourished, well developed in no acute distress HEENT: Normal NECK: No JVD; No carotid bruits LYMPHATICS: No lymphadenopathy CARDIAC: RRR, no murmurs, rubs, gallops RESPIRATORY:  Clear to auscultation without rales, wheezing or rhonchi  ABDOMEN: Soft, non-tender, non-distended MUSCULOSKELETAL:  No edema; No deformity  SKIN: Warm and dry NEUROLOGIC:  Alert and oriented x 3 PSYCHIATRIC:  Normal affect   ASSESSMENT:    1. CAD in native artery   2. Essential hypertension   3. Aortic dilatation (HCC)   4. Hyperlipidemia, unspecified hyperlipidemia type      PLAN:    CAD: Presented with atypical chest pain.   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  Reported no chest pain but was having dyspnea on exertion, Lexiscan Myoview on 08/07/2021 showed normal perfusion, EF 51%.  Currently denies any anginal symptoms -Continue atorvastatin 40 mg daily and Zetia 10 mg daily -Continue aspirin 81 mg daily  Hypertension: On losartan 100 mg daily.  Appears controlled.   Hyperlipidemia: on atorvastatin 40 mg daily, LDL 87 on 07/16/22.  Increased atorvastatin to 80 mg daily but did not tolerate due to joint pain.  Atorvastatin decreased back to 40 mg daily and Zetia 10 mg daily added.  LDL 43 on 04/30/2023  Aortic aneurysm: ascending aorta measures 42mm on CTA.  Repeat CTA on 02/2021 showed stable ascending aortic dilatation measuring 40 mm.  CTA chest 02/2022 showed stable 41 mm ascending aorta.  Echocardiogram 04/23/2023 showed ascending aortic aneurysm measuring 45 mm -Follow-up with echocardiogram in 1 year (04/2024)  Pulmonary nodule: 5 mm right upper lobe nodule noted on CTA chest 03/13/2021.  Follow-up CT chest in 1 year (02/2022) showed stable right upper lobe nodule, no further workup recommended  Leg numbness: Likely due to known spinal stenosis.  Normal ABIs  04/16/22   RTC in 6 months   Medication Adjustments/Labs and Tests Ordered: Current medicines are reviewed at length with the patient today.  Concerns regarding medicines are outlined above.  No orders of the defined types were placed in this encounter.   No orders of the defined types were placed in this encounter.    Patient Instructions  Medication Instructions:  Continue current medications *If you need a refill on your cardiac medications before your  next appointment, please call your pharmacy*   Lab Work: none If you have labs (blood work) drawn today and your tests  are completely normal, you will receive your results only by: MyChart Message (if you have MyChart) OR A paper copy in the mail If you have any lab test that is abnormal or we need to change your treatment, we will call you to review the results.   Testing/Procedures: Echo already scheduled for June 2025   Follow-Up: At [facility], you and your health needs are our priority.  As part of our continuing mission to provide you with exceptional heart care, we have created designated Provider Care Teams.  These Care Teams include your primary Cardiologist (physician) and Advanced Practice Providers (APPs -  Physician Assistants and Nurse Practitioners) who all work together to provide you with the care you need, when you need it.  We recommend signing up for the patient portal called "MyChart".  Sign up information is provided on this After Visit Summary.  MyChart is used to connect with patients for Virtual Visits (Telemedicine).  Patients are able to view lab/test results, encounter notes, upcoming appointments, etc.  Non-urgent messages can be sent to your provider as well.   To learn more about what you can do with MyChart, go to [email or web address].    Your next appointment:    Please Schedule visit after ECHO is done June 2025- 6 month follow up   Provider:   Little Ishikawa, MD     Other Instructions none      Signed, Little Ishikawa, MD  10/19/2023 11:58 AM    [facility]

## 2023-10-19 ENCOUNTER — Encounter: Payer: Self-pay | Admitting: Cardiology

## 2023-10-19 ENCOUNTER — Ambulatory Visit: Payer: Medicare HMO | Attending: Cardiology | Admitting: Cardiology

## 2023-10-19 VITALS — BP 130/74 | HR 73 | Ht 70.0 in | Wt 200.0 lb

## 2023-10-19 DIAGNOSIS — I10 Essential (primary) hypertension: Secondary | ICD-10-CM

## 2023-10-19 DIAGNOSIS — I77819 Aortic ectasia, unspecified site: Secondary | ICD-10-CM

## 2023-10-19 DIAGNOSIS — E785 Hyperlipidemia, unspecified: Secondary | ICD-10-CM | POA: Diagnosis not present

## 2023-10-19 DIAGNOSIS — I2510 Atherosclerotic heart disease of native coronary artery without angina pectoris: Secondary | ICD-10-CM | POA: Diagnosis not present

## 2023-10-19 NOTE — Patient Instructions (Addendum)
Medication Instructions:  Continue current medications *If you need a refill on your cardiac medications before your next appointment, please call your pharmacy*   Lab Work: none If you have labs (blood work) drawn today and your tests are completely normal, you will receive your results only by: MyChart Message (if you have MyChart) OR A paper copy in the mail If you have any lab test that is abnormal or we need to change your treatment, we will call you to review the results.   Testing/Procedures: Echo already scheduled for June 2025   Follow-Up: At [facility], you and your health needs are our priority.  As part of our continuing mission to provide you with exceptional heart care, we have created designated Provider Care Teams.  These Care Teams include your primary Cardiologist (physician) and Advanced Practice Providers (APPs -  Physician Assistants and Nurse Practitioners) who all work together to provide you with the care you need, when you need it.  We recommend signing up for the patient portal called "MyChart".  Sign up information is provided on this After Visit Summary.  MyChart is used to connect with patients for Virtual Visits (Telemedicine).  Patients are able to view lab/test results, encounter notes, upcoming appointments, etc.  Non-urgent messages can be sent to your provider as well.   To learn more about what you can do with MyChart, go to [email or web address].    Your next appointment:    Please Schedule visit after ECHO is done June 2025- 6 month follow up   Provider:   Little Ishikawa, MD     Other Instructions none

## 2023-10-29 DIAGNOSIS — L570 Actinic keratosis: Secondary | ICD-10-CM | POA: Diagnosis not present

## 2023-10-29 DIAGNOSIS — L308 Other specified dermatitis: Secondary | ICD-10-CM | POA: Diagnosis not present

## 2023-10-29 DIAGNOSIS — L578 Other skin changes due to chronic exposure to nonionizing radiation: Secondary | ICD-10-CM | POA: Diagnosis not present

## 2023-11-04 ENCOUNTER — Ambulatory Visit: Payer: Medicare HMO | Admitting: Orthopedic Surgery

## 2023-11-04 DIAGNOSIS — M1711 Unilateral primary osteoarthritis, right knee: Secondary | ICD-10-CM | POA: Diagnosis not present

## 2023-11-05 ENCOUNTER — Encounter: Payer: Self-pay | Admitting: Orthopedic Surgery

## 2023-11-05 NOTE — Progress Notes (Signed)
Office Visit Note   Patient: Carlos David           Date of Birth: [birth year]           MRN: [id number] Visit Date: 11/04/2023 Requested by: Ardith Dark, MD [address] [place],  [place] [address] PCP: Ardith Dark, MD  Subjective: Chief Complaint  Patient presents with   Right Knee - Pain    HPI: Carlos David is a 77 y.o. male who presents to the office reporting right knee pain.  Prior aspiration helped him about a week.  He does use a knee brace.  He did well with left knee cyst aspiration several months ago.  He was doing a lot of yard work which aggravated the right knee and he was also going up and down some stairs.  Localizes the pain to the medial side.  Last clinic visit the knee was aspirated and injected.  He did well with that but his symptoms have recurred.  He is able to sleep well.  Voltaren gel used in the mornings helps some.  He describes having weakness in the right leg without the brace..                ROS: All systems reviewed are negative as they relate to the chief complaint within the history of present illness.  Patient denies fevers or chills.  Assessment & Plan: Visit Diagnoses:  1. Arthritis of right knee     Plan: Impression is recurrent effusion and mechanical symptoms in the knee that does not have too much arthritis on plain radiographs.  Plan at this time is MRI right knee to evaluate meniscal pathology versus significant arthritis.  Decision point at that time will be whether or not arthroscopic intervention can give him predictable relief or if this is just a progressive arthritic condition that is not easily defined on plain radiographs.  Follow-up after that study.  Follow-Up Instructions: No follow-ups on file.   Orders:  Orders Placed This Encounter  Procedures   MR Knee Right w/o contrast   No orders of the defined types were placed in this encounter.     Procedures: No procedures performed   Clinical Data: No additional  findings.  Objective: Vital Signs: There were no vitals taken for this visit.  Physical Exam:  Constitutional: Patient appears well-developed HEENT:  Head: Normocephalic Eyes:EOM are normal Neck: Normal range of motion Cardiovascular: Normal rate Pulmonary/chest: Effort normal Neurologic: Patient is alert Skin: Skin is warm Psychiatric: Patient has normal mood and affect  Ortho Exam: Ortho exam demonstrates full range of motion of that right knee.  Recurrent effusion is present.  Medial and lateral joint line tenderness is present along with more patellofemoral crepitus on the right than the left.  Negative patellar apprehension.  Collateral crucial ligaments are stable.  Specialty Comments:  No specialty comments available.  Imaging: No results found.   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 12/26/2008   Dyslipidemia 12/26/2008   Past Medical History:  Diagnosis Date   Actinic keratosis 12/16/2010   Qualifier:  Diagnosis of  By: Amador Cunas  MD, Janett Labella    BPH (benign prostatic hyperplasia)    Cancer [facility])    thyroid cancer 1990   Cataract    both eyes   History of radiation therapy 1990   HYPERLIPIDEMIA 12/26/2008   Hypertension    HYPOTHYROIDISM 12/26/2008   PPD positive    Thoracic aortic aneurysm (HCC)    THYROID CANCER, HX OF 1990   THYROID CANCER, HX OF 12/26/2008   Qualifier: Diagnosis of  By: Amador Cunas  MD, Janett Labella     Family History  Problem Relation Age of Onset   Colon cancer Neg Hx     Past Surgical History:  Procedure  Laterality Date   CATARACT EXTRACTION, BILATERAL  2020   COLONOSCOPY     10-ye ago-normal at [facility]   EYE SURGERY     as child   HAND SURGERY     right hand trigger figger    INGUINAL HERNIA REPAIR Right 02/25/2022   Procedure: OPEN RIGHT INGUINAL HERNIA REPAIR WITH MESH;  Surgeon: Kinsinger, De Blanch, MD;  Location: [facility];  Service: General;  Laterality: Right;   neckotomy  1990   20 lymph nodes removed from left neck   TOTAL THYROIDECTOMY  1990   TRANSURETHRAL RESECTION OF PROSTATE N/A 01/21/2019   Procedure: TRANSURETHRAL RESECTION OF THE PROSTATE (TURP);  Surgeon: Sebastian Ache, MD;  Location: [facility];  Service: Urology;  Laterality: N/A;  45 MINS   Social History   Occupational History   Not on file  Tobacco Use   Smoking status: Former    Current packs/day: 0.00    Types: Cigarettes, Pipe    Quit date: 11/17/1968    Years since quitting: 55.0   Smokeless tobacco: Never  Vaping Use   Vaping status: Never Used  Substance and Sexual Activity   Alcohol use: Yes    Alcohol/week: 0.0 standard drinks of alcohol    Comment: occas. beer   Drug use: No   Sexual activity: Not on file

## 2023-11-16 ENCOUNTER — Other Ambulatory Visit: Payer: Self-pay | Admitting: Family Medicine

## 2023-11-19 ENCOUNTER — Telehealth: Payer: Self-pay

## 2023-11-19 NOTE — Telephone Encounter (Signed)
 Dr. Avram, This patient has been scheduled for a PV in [facility] on 11/25/2023.  Recall colon scheduled on 12/11/2023 in the [facility].  He is 79 years young and has not had an OV.   Does this patient need to be R/S for an OV or can he proceed with recall colon? Please advise  Thanks, Bre, PV RN

## 2023-11-19 NOTE — Telephone Encounter (Signed)
 He may be scheduled directly for colonoscopy if that is his wish (to repeat a colonoscopy)

## 2023-11-20 NOTE — Telephone Encounter (Signed)
Noted on PV chart ?

## 2023-11-25 ENCOUNTER — Ambulatory Visit: Payer: Medicare HMO | Admitting: Orthopedic Surgery

## 2023-11-25 ENCOUNTER — Ambulatory Visit: Payer: Medicare HMO | Admitting: Surgical

## 2023-11-25 ENCOUNTER — Ambulatory Visit (AMBULATORY_SURGERY_CENTER): Payer: Medicare HMO

## 2023-11-25 VITALS — Ht 70.0 in | Wt 195.0 lb

## 2023-11-25 DIAGNOSIS — Z860100 Personal history of colon polyps, unspecified: Secondary | ICD-10-CM

## 2023-11-25 MED ORDER — NA SULFATE-K SULFATE-MG SULF 17.5-3.13-1.6 GM/177ML PO SOLN: 1.0000 | Freq: Once | ORAL | 0 refills | Status: AC

## 2023-11-25 NOTE — Progress Notes (Signed)
 No egg or soy allergy known to patient  No issues known to pt with past sedation with any surgeries or procedures Patient denies ever being told they had issues or difficulty with intubation  No FH of Malignant Hyperthermia Pt is not on diet pills Pt is not on  home 02  Pt is not on blood thinners  Pt denies issues with constipation  No A fib or A flutter Have any cardiac testing pending--Echo done every 6 months; last Echo on 04/2023 Pt can ambulate  Pt denies use of chewing tobacco Discussed diabetic I weight loss medication holds Discussed NSAID holds Checked BMI Pt instructed to use Singlecare[employer]com or GoodRx for a price reduction on prep  Patient's chart reviewed by Norleen Schillings CNRA prior to previsit and patient appropriate for the [facility].  Pre visit completed and red dot placed by patient's name on their procedure day (on provider's schedule).

## 2023-11-30 ENCOUNTER — Ambulatory Visit
Admission: RE | Admit: 2023-11-30 | Discharge: 2023-11-30 | Disposition: A | Discharge status: Home / Self Care | Payer: Medicare HMO | Source: Ambulatory Visit | Attending: Orthopedic Surgery | Admitting: Orthopedic Surgery

## 2023-11-30 DIAGNOSIS — S83241A Other tear of medial meniscus, current injury, right knee, initial encounter: Secondary | ICD-10-CM | POA: Diagnosis not present

## 2023-11-30 DIAGNOSIS — M25561 Pain in right knee: Secondary | ICD-10-CM | POA: Diagnosis not present

## 2023-11-30 DIAGNOSIS — S83281A Other tear of lateral meniscus, current injury, right knee, initial encounter: Secondary | ICD-10-CM | POA: Diagnosis not present

## 2023-11-30 DIAGNOSIS — M1711 Unilateral primary osteoarthritis, right knee: Secondary | ICD-10-CM

## 2023-12-07 ENCOUNTER — Ambulatory Visit (INDEPENDENT_AMBULATORY_CARE_PROVIDER_SITE_OTHER): Payer: Medicare HMO | Admitting: Orthopedic Surgery

## 2023-12-07 ENCOUNTER — Other Ambulatory Visit: Payer: Self-pay

## 2023-12-07 DIAGNOSIS — M170 Bilateral primary osteoarthritis of knee: Secondary | ICD-10-CM

## 2023-12-07 DIAGNOSIS — M47816 Spondylosis without myelopathy or radiculopathy, lumbar region: Secondary | ICD-10-CM | POA: Diagnosis not present

## 2023-12-07 DIAGNOSIS — M1712 Unilateral primary osteoarthritis, left knee: Secondary | ICD-10-CM | POA: Diagnosis not present

## 2023-12-07 DIAGNOSIS — M1711 Unilateral primary osteoarthritis, right knee: Secondary | ICD-10-CM

## 2023-12-07 DIAGNOSIS — M23007 Cystic meniscus, unspecified meniscus, left knee: Secondary | ICD-10-CM

## 2023-12-07 DIAGNOSIS — M4316 Spondylolisthesis, lumbar region: Secondary | ICD-10-CM | POA: Diagnosis not present

## 2023-12-07 NOTE — Telephone Encounter (Signed)
Patient inquiring if he is able to take prednisone for the next five days. Please advise.

## 2023-12-07 NOTE — Telephone Encounter (Signed)
Spoke to pt --- all questions answered.

## 2023-12-08 ENCOUNTER — Encounter: Payer: Self-pay | Admitting: Orthopedic Surgery

## 2023-12-08 NOTE — Progress Notes (Unsigned)
Office Visit Note   Patient: Carlos David           Date of Birth: 1946-03-24           MRN: [id number] Visit Date: 12/07/2023 Requested by: Ardith Dark, MD [address] [place],  [place] [address] PCP: Ardith Dark, MD  Subjective: Chief Complaint  Patient presents with   Right Knee - Follow-up    MRI review    HPI: JULIANNA David is a 78 y.o. male who presents to the office reporting bilateral knee pain.  Since he was last seen has had a right knee MRI scan.  He has had a left knee injection in the past with meniscal cyst noted which was also aspirated and injected and that has given him good relief for a year.  On the right-hand side he reports similar pain with swelling.  Does use a brace.  Going up and down stairs hurts both knees but that is worse on the right-hand side.  MRI scan of the right knee is reviewed.  Does show high-grade patellofemoral cartilage loss along with mild medial and lateral compartment articular surface cartilage loss with both medial and lateral meniscal tears.  These are degenerative type tear similar to what he had in his left knee..                ROS: All systems reviewed are negative as they relate to the chief complaint within the history of present illness.  Patient denies fevers or chills.  Assessment & Plan: Visit Diagnoses:  1. Arthritis of right knee     Plan: Impression is bilateral knee meniscal pathology.  More symptomatic on the right-hand side with effusion present.  Left-hand side is getting a little bit more symptomatic.  Plan at this time is bilateral knee injection with also ultrasound-guided injection into that meniscal cyst on that left-hand side.  Will see if that can help him avoid arthroscopic debridement.  He will follow-up as needed.  Continue with nonweightbearing quad strengthening exercises.  Follow-Up Instructions: No follow-ups on file.   Orders:  Orders Placed This Encounter  Procedures   US Guided Needle  Placement - No Linked Charges   No orders of the defined types were placed in this encounter.     Procedures: Large Joint Inj: bilateral knee on 12/07/2023 4:49 PM Indications: diagnostic evaluation, joint swelling and pain Details: 18 G 1.5 in needle, superolateral approach  Arthrogram: No  Medications (Right): 5 mL lidocaine 1 %; 4 mL bupivacaine 0.25 %; 40 mg methylPREDNISolone acetate 40 MG/ML Medications (Left): 5 mL lidocaine 1 %; 4 mL bupivacaine 0.25 %; 40 mg methylPREDNISolone acetate 40 MG/ML Outcome: tolerated well, no immediate complications Procedure, treatment alternatives, risks and benefits explained, specific risks discussed. Consent was given by the patient. Immediately prior to procedure a time out was called to verify the correct patient, procedure, equipment, support staff and site/side marked as required. Patient was prepped and draped in the usual sterile fashion.    Large Joint Inj: L knee on 12/07/2023 9:55 AM Indications: diagnostic evaluation, joint swelling and pain Details: 22 G 1.5 in needle, ultrasound-guided medial approach  Arthrogram: No  Medications: 5 mL lidocaine 1 %; 10 mg methylPREDNISolone acetate 40 MG/ML; 4 mL bupivacaine 0.25 % Outcome: tolerated well, no immediate complications Procedure, treatment alternatives, risks and benefits explained, specific risks discussed. Consent was given by the patient. Immediately prior to procedure a time out was called to verify the correct  patient, procedure, equipment, support staff and site/side marked as required. Patient was prepped and draped in the usual sterile fashion.     This procedure describes injection of a medial meniscal cyst with ultrasound guidance without aspiration  Clinical Data: No additional findings.  Objective: Vital Signs: There were no vitals taken for this visit.  Physical Exam:  Constitutional: Patient appears well-developed HEENT:  Head: Normocephalic Eyes:EOM are  normal Neck: Normal range of motion Cardiovascular: Normal rate Pulmonary/chest: Effort normal Neurologic: Patient is alert Skin: Skin is warm Psychiatric: Patient has normal mood and affect  Ortho Exam: Ortho exam demonstrates normal gait alignment.  Trace effusion right knee no effusion left knee.  Patient has essentially full extension and on both sides with flexion easily past 90.  Collateral crucial ligaments are stable in both knees.  Meniscal cyst is not has palpable as it was last year on that medial side on the left-hand side.  Does have both medial and lateral joint line tenderness in both knees with mild patellofemoral crepitus bilaterally.  Pedal pulses palpable.  No groin pain with internal or external rotation of either leg.  Specialty Comments:  No specialty comments available.  Imaging: No results found.   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 12/26/2008   Dyslipidemia 12/26/2008   Past Medical History:  Diagnosis Date   Actinic keratosis 12/16/2010   Qualifier: Diagnosis of  By: Amador Cunas  MD, Janett Labella    Arthritis    BPH (benign prostatic hyperplasia)    Cancer (HCC)    thyroid cancer 1990   Cataract    both eyes   History of radiation therapy 1990   HYPERLIPIDEMIA 12/26/2008   Hypertension    HYPOTHYROIDISM 12/26/2008   PPD positive    Thoracic aortic aneurysm (HCC)    THYROID CANCER, HX OF 1990   THYROID CANCER, HX OF 12/26/2008   Qualifier: Diagnosis of  By: Amador Cunas  MD, Janett Labella     Family History  Problem Relation Age  of Onset   Colon cancer Neg Hx    Rectal cancer Neg Hx    Stomach cancer Neg Hx    Esophageal cancer Neg Hx     Past Surgical History:  Procedure Laterality Date   CATARACT EXTRACTION, BILATERAL  2020   COLONOSCOPY     10-ye ago-normal at [facility]   EYE SURGERY     as child   HAND SURGERY     right hand trigger figger    INGUINAL HERNIA REPAIR Right 02/25/2022   Procedure: OPEN RIGHT INGUINAL HERNIA REPAIR WITH MESH;  Surgeon: Kinsinger, De Blanch, MD;  Location: [facility];  Service: General;  Laterality: Right;   neckotomy  1990   20 lymph nodes removed from left neck   TOTAL THYROIDECTOMY  1990   TRANSURETHRAL RESECTION OF PROSTATE N/A 01/21/2019   Procedure: TRANSURETHRAL RESECTION OF THE PROSTATE (TURP);  Surgeon: Sebastian Ache, MD;  Location: [facility];  Service: Urology;  Laterality: N/A;  9 MINS   Social History   Occupational History   Not on file  Tobacco Use   Smoking status: Former    Current packs/day: 0.00    Types: Cigarettes, Pipe    Quit date: 11/17/1968    Years since quitting: 55.0   Smokeless tobacco: Never  Vaping Use   Vaping status: Never Used  Substance and Sexual Activity   Alcohol use: Yes    Alcohol/week: 0.0 standard drinks of alcohol    Comment: occas. beer   Drug use: No   Sexual activity: Not on file

## 2023-12-09 MED ORDER — METHYLPREDNISOLONE ACETATE 40 MG/ML IJ SUSP
40.0000 mg | INTRAMUSCULAR | Status: AC | PRN
  Administered 2023-12-07: 40 mg via INTRA_ARTICULAR

## 2023-12-09 MED ORDER — METHYLPREDNISOLONE ACETATE 40 MG/ML IJ SUSP
10.0000 mg | INTRAMUSCULAR | Status: AC | PRN
  Administered 2023-12-07: 10 mg via INTRA_ARTICULAR

## 2023-12-09 MED ORDER — BUPIVACAINE HCL 0.25 % IJ SOLN
4.0000 mL | INTRAMUSCULAR | Status: AC | PRN
  Administered 2023-12-07: 4 mL via INTRA_ARTICULAR

## 2023-12-09 MED ORDER — LIDOCAINE HCL 1 % IJ SOLN
5.0000 mL | INTRAMUSCULAR | Status: AC | PRN
  Administered 2023-12-07: 5 mL

## 2023-12-10 NOTE — Progress Notes (Signed)
[facility] History and Physical   Primary Care Physician:  Ardith Dark, MD   Reason for Procedure:  History of colon polyps  Plan:    Colonoscopy     HPI: Carlos David is a 78 y.o. male with a history of colon polyps as outlined below, presenting for a surveillance colonoscopy. October 02, 2015 Dr. Christella Hartigan 1.   Two sessile polyps ranging between 3-57mm in size were found in the descending colon; polypectomies were performed with a cold snare 2.   Semi-pedunculated polyp was found in the sigmoid colon; polypectomy was performed using snare cautery 3.   Mild diverticulosis was noted in the left colon 4.   The examination was otherwise normal  1. Surgical [P], descending, 2 polyps - TUBULAR ADENOMA AND HYPERPLASTIC COLONIC POLYP. - NO HIGH GRADE DYSPLASIA OR MALIGNANCY IDENTIFIED. 2. Surgical [P], sigmoid, 1 polyp - POLYPOID GRANULATION TISSUE WITH DIFFUSE CHRONIC ACTIVE INFLAMMATION. - NO COLONIC MUCOSA IDENTIFIED. - NEGATIVE FOR MALIGNANCY. Past Medical History:  Diagnosis Date   Actinic keratosis 12/16/2010   Qualifier: Diagnosis of  By: Amador Cunas  MD, Janett Labella    Arthritis    BPH (benign prostatic hyperplasia)    Cancer (HCC)    thyroid cancer 1990   Cataract    both eyes   History of radiation therapy 1990   HYPERLIPIDEMIA 12/26/2008   Hypertension    HYPOTHYROIDISM 12/26/2008   PPD positive    Thoracic aortic aneurysm (HCC)    THYROID CANCER, HX OF 1990   THYROID CANCER, HX OF 12/26/2008   Qualifier: Diagnosis of  By: Amador Cunas  MD, Janett Labella     Past Surgical History:  Procedure Laterality Date   CATARACT EXTRACTION, BILATERAL  2020   COLONOSCOPY     10-ye ago-normal at [facility]   EYE SURGERY     as child   HAND SURGERY     right hand trigger figger    INGUINAL HERNIA REPAIR Right 02/25/2022   Procedure: OPEN RIGHT INGUINAL HERNIA REPAIR WITH MESH;  Surgeon: Kinsinger, De Blanch, MD;  Location: [facility] [place] [facility];  Service:  General;  Laterality: Right;   neckotomy  1990   20 lymph nodes removed from left neck   TOTAL THYROIDECTOMY  1990   TRANSURETHRAL RESECTION OF PROSTATE N/A 01/21/2019   Procedure: TRANSURETHRAL RESECTION OF THE PROSTATE (TURP);  Surgeon: Sebastian Ache, MD;  Location: [facility];  Service: Urology;  Laterality: N/A;  75 MINS    Prior to Admission medications   Medication Sig Start Date End Date Taking? Authorizing Provider  aspirin EC 81 MG tablet Take 1 tablet (81 mg total) by mouth daily. Swallow whole. 07/30/21   Little Ishikawa, MD  atorvastatin (LIPITOR) 40 MG tablet Take 1 tablet (40 mg total) by mouth daily. 12/23/22   Little Ishikawa, MD  benzonatate (TESSALON) 100 MG capsule Take 1 capsule (100 mg total) by mouth 3 (three) times daily as needed for cough. Patient not taking: Reported on 11/25/2023 10/17/23   Rising, Lurena Joiner, PA-C  cyclobenzaprine (FLEXERIL) 10 MG tablet Take 10 mg by mouth 3 (three) times daily as needed for muscle spasms.    [provider]  ezetimibe (ZETIA) 10 MG tablet TAKE 1 TABLET BY MOUTH EVERY DAY 09/22/23   Little Ishikawa, MD  finasteride (PROSCAR) 5 MG tablet TAKE 1 TABLET BY MOUTH EVERY DAY 10/15/16   Gordy Savers, MD  gabapentin (NEURONTIN) 300 MG capsule Take 1 capsule by mouth 2 (  two) times daily. 05/21/21   [provider]  hydrocortisone 2.5 % cream  06/05/20   [provider]  levothyroxine (SYNTHROID) 137 MCG tablet TAKE 1 TABLET BY MOUTH EVERY DAY 11/16/23   Ardith Dark, MD  losartan (COZAAR) 100 MG tablet TAKE 1 TABLET BY MOUTH EVERY DAY 09/16/23   Little Ishikawa, MD  meloxicam (MOBIC) 15 MG tablet Take 1 tablet (15 mg total) by mouth as needed. 07/30/21   Little Ishikawa, MD  TOLAK 4 % CREA Apply 1 Application topically daily. Patient not taking: Reported on 11/25/2023 10/29/23   [provider]    Current Outpatient Medications  Medication Sig  Dispense Refill   aspirin EC 81 MG tablet Take 1 tablet (81 mg total) by mouth daily. Swallow whole. 90 tablet 3   atorvastatin (LIPITOR) 40 MG tablet Take 1 tablet (40 mg total) by mouth daily. 90 tablet 3   ezetimibe (ZETIA) 10 MG tablet TAKE 1 TABLET BY MOUTH EVERY DAY 90 tablet 1   finasteride (PROSCAR) 5 MG tablet TAKE 1 TABLET BY MOUTH EVERY DAY 90 tablet 1   gabapentin (NEURONTIN) 300 MG capsule Take 1 capsule by mouth 2 (two) times daily.     levothyroxine (SYNTHROID) 137 MCG tablet TAKE 1 TABLET BY MOUTH EVERY DAY 90 tablet 1   losartan (COZAAR) 100 MG tablet TAKE 1 TABLET BY MOUTH EVERY DAY 90 tablet 1   meloxicam (MOBIC) 15 MG tablet Take 1 tablet (15 mg total) by mouth as needed. 30 tablet 0   benzonatate (TESSALON) 100 MG capsule Take 1 capsule (100 mg total) by mouth 3 (three) times daily as needed for cough. (Patient not taking: Reported on 11/25/2023) 30 capsule 0   cyclobenzaprine (FLEXERIL) 10 MG tablet Take 10 mg by mouth 3 (three) times daily as needed for muscle spasms.     hydrocortisone 2.5 % cream      predniSONE (STERAPRED UNI-PAK 21 TAB) 5 MG (21) TBPK tablet Take 5 mg by mouth once.     TOLAK 4 % CREA Apply 1 Application topically daily. (Patient not taking: Reported on 12/11/2023)     Current Facility-Administered Medications  Medication Dose Route Frequency Provider Last Rate Last Admin   0.9 %  sodium chloride infusion  500 mL Intravenous Once Iva Boop, MD        Allergies as of 12/11/2023 - Review Complete 12/11/2023  Allergen Reaction Noted   Penicillins Hives     Family History  Problem Relation Age of Onset   Colon cancer Neg Hx    Rectal cancer Neg Hx    Stomach cancer Neg Hx    Esophageal cancer Neg Hx     Social History   Socioeconomic History   Marital status: Married    Spouse name: Not on file   Number of children: Not on file   Years of education: Not on file   Highest education level: Not on file  Occupational History   Not on  file  Tobacco Use   Smoking status: Former    Current packs/day: 0.00    Types: Cigarettes, Pipe    Quit date: 11/17/1968    Years since quitting: 55.1   Smokeless tobacco: Never  Vaping Use   Vaping status: Never Used  Substance and Sexual Activity   Alcohol use: Yes    Alcohol/week: 0.0 standard drinks of alcohol    Comment: occas. beer   Drug use: No   Sexual activity: Not on  file  Other Topics Concern   Not on file  Social History Narrative   Not on file   Social Drivers of Health   Financial Resource Strain: Low Risk  (03/03/2023)   Overall Financial Resource Strain (CARDIA)    Difficulty of Paying Living Expenses: Not hard at all  Food Insecurity: No Food Insecurity (03/03/2023)   Hunger Vital Sign    Worried About Running Out of Food in the Last Year: Never true    Ran Out of Food in the Last Year: Never true  Transportation Needs: No Transportation Needs (03/03/2023)   PRAPARE - Administrator, Civil Service (Medical): No    Lack of Transportation (Non-Medical): No  Physical Activity: Inactive (03/03/2023)   Exercise Vital Sign    Days of Exercise per Week: 0 days    Minutes of Exercise per Session: 0 min  Stress: No Stress Concern Present (03/03/2023)   [employer] of Occupational Health - Occupational Stress Questionnaire    Feeling of Stress : Not at all  Social Connections: Moderately Integrated (03/03/2023)   Social Connection and Isolation Panel [NHANES]    Frequency of Communication with Friends and Family: More than three times a week    Frequency of Social Gatherings with Friends and Family: More than three times a week    Attends Religious Services: More than 4 times per year    Active Member of [employer] or Organizations: No    Attends Banker Meetings: Never    Marital Status: Married  Catering manager Violence: Not At Risk (03/03/2023)   Humiliation, Afraid, Rape, and Kick questionnaire    Fear of Current or Ex-Partner: No     Emotionally Abused: No    Physically Abused: No    Sexually Abused: No    Review of Systems:  All other review of systems negative except as mentioned in the HPI.  Physical Exam: Vital signs BP (!) 198/91   Pulse 70   Temp (!) 97.2 F (36.2 C) (Temporal)   Resp 15   Ht 5\' 10"  (1.778 m)   Wt 195 lb (88.5 kg)   SpO2 100%   BMI 27.98 kg/m   General:   Alert,  Well-developed, well-nourished, pleasant and cooperative in NAD Lungs:  Clear throughout to auscultation.   Heart:  Regular rate and rhythm; no murmurs, clicks, rubs,  or gallops. Abdomen:  Soft, nontender and nondistended. Normal bowel sounds.   Neuro/Psych:  Alert and cooperative. Normal mood and affect. A and O x 3   @Elayjah Chaney  Sena Slate, MD, [facility] [phone] (pager) 12/11/2023 8:51 AM@

## 2023-12-11 ENCOUNTER — Encounter: Payer: Self-pay | Admitting: Internal Medicine

## 2023-12-11 ENCOUNTER — Ambulatory Visit (AMBULATORY_SURGERY_CENTER): Payer: Medicare HMO | Admitting: Internal Medicine

## 2023-12-11 VITALS — BP 184/80 | HR 58 | Temp 97.2°F | Resp 11 | Ht 70.0 in | Wt 195.0 lb

## 2023-12-11 DIAGNOSIS — Z860100 Personal history of colon polyps, unspecified: Secondary | ICD-10-CM | POA: Diagnosis not present

## 2023-12-11 DIAGNOSIS — D122 Benign neoplasm of ascending colon: Secondary | ICD-10-CM

## 2023-12-11 DIAGNOSIS — K5730 Diverticulosis of large intestine without perforation or abscess without bleeding: Secondary | ICD-10-CM

## 2023-12-11 DIAGNOSIS — Z860101 Personal history of adenomatous and serrated colon polyps: Secondary | ICD-10-CM | POA: Diagnosis not present

## 2023-12-11 DIAGNOSIS — I10 Essential (primary) hypertension: Secondary | ICD-10-CM | POA: Diagnosis not present

## 2023-12-11 DIAGNOSIS — Z1211 Encounter for screening for malignant neoplasm of colon: Secondary | ICD-10-CM | POA: Diagnosis not present

## 2023-12-11 DIAGNOSIS — E039 Hypothyroidism, unspecified: Secondary | ICD-10-CM | POA: Diagnosis not present

## 2023-12-11 MED ORDER — SODIUM CHLORIDE 0.9 % IV SOLN: 500.0000 mL | Freq: Once | INTRAVENOUS | Status: AC

## 2023-12-11 NOTE — Progress Notes (Signed)
Called to room to assist during endoscopic procedure.  Patient ID and intended procedure confirmed with present staff. Received instructions for my participation in the procedure from the performing physician.

## 2023-12-11 NOTE — Progress Notes (Signed)
Drowsy, VSS, resps reg and even. Report to RN

## 2023-12-11 NOTE — Progress Notes (Signed)
Pt's states no medical or surgical changes since previsit or office visit.

## 2023-12-11 NOTE — Op Note (Signed)
[facility] Patient Name: Carlos David Procedure Date: 12/11/2023 8:44 AM MRN: [id number] Endoscopist: Iva Boop , MD, [id number] Age: 78 Referring MD:  Date of Birth: 1946/07/29 Gender: Male Account #: [id number] Procedure:                Colonoscopy Indications:              Surveillance: Personal history of adenomatous                            polyps on last colonoscopy > 5 years ago, Last                            colonoscopy: November 2016 Medicines:                Monitored Anesthesia Care Procedure:                Pre-Anesthesia Assessment:                           - Prior to the procedure, a History and Physical                            was performed, and patient medications and                            allergies were reviewed. The patient's tolerance of                            previous anesthesia was also reviewed. The risks                            and benefits of the procedure and the sedation                            options and risks were discussed with the patient.                            All questions were answered, and informed consent                            was obtained. Prior Anticoagulants: The patient has                            taken no anticoagulant or antiplatelet agents. ASA                            Grade Assessment: II - A patient with mild systemic                            disease. After reviewing the risks and benefits,                            the patient was deemed in satisfactory condition to  undergo the procedure.                           After obtaining informed consent, the colonoscope                            was passed under direct vision. Throughout the                            procedure, the patient's blood pressure, pulse, and                            oxygen saturations were monitored continuously. The                            Olympus CF-HQ190L ([id number]) Colonoscope  was                            introduced through the anus and advanced to the the                            cecum, identified by appendiceal orifice and                            ileocecal valve. The colonoscopy was performed                            without difficulty. The patient tolerated the                            procedure well. The quality of the bowel                            preparation was excellent. The ileocecal valve,                            appendiceal orifice, and rectum were photographed.                            The bowel preparation used was SUPREP via split                            dose instruction. Scope In: 8:58:52 AM Scope Out: 9:10:07 AM Scope Withdrawal Time: 0 hours 9 minutes 8 seconds  Total Procedure Duration: 0 hours 11 minutes 15 seconds  Findings:                 The perianal and digital rectal examinations were                            normal.                           A diminutive polyp was found in the ascending  colon. The polyp was sessile. The polyp was removed                            with a cold snare. Resection and retrieval were                            complete. Verification of patient identification                            for the specimen was done. Estimated blood loss was                            minimal.                           Multiple diverticula were found in the sigmoid                            colon and descending colon.                           The exam was otherwise without abnormality on                            direct and retroflexion views. Complications:            No immediate complications. Estimated Blood Loss:     Estimated blood loss was minimal. Impression:               - One diminutive polyp in the ascending colon,                            removed with a cold snare. Resected and retrieved.                           - Diverticulosis in the sigmoid colon and in  the                            descending colon.                           - The examination was otherwise normal on direct                            and retroflexion views.                           - Personal history of colonic polyp - diminutive                            adenoma removed 09/2015. Recommendation:           - Patient has a contact number available for                            emergencies. The signs and symptoms of potential  delayed complications were discussed with the                            patient. Return to normal activities tomorrow.                            Written discharge instructions were provided to the                            patient.                           - Resume previous diet.                           - Continue present medications.                           - Await pathology results.                           - No repeat colonoscopy due to age. Iva Boop, MD 12/11/2023 9:18:11 AM This report has been signed electronically.

## 2023-12-11 NOTE — Patient Instructions (Addendum)
One tiny polyp removed and I saw diverticulosis - see the handout about this.  Polyp will be analyzed and I will let you know though I feel certain it is benign.  No need for routine colonoscopy anymore - only if having problems - hope not!  I appreciate the opportunity to care for you. Iva Boop, MD, FACG   YOU HAD AN ENDOSCOPIC PROCEDURE TODAY AT THE [facility]:   Refer to the procedure report that was given to you for any specific questions about what was found during the examination.  If the procedure report does not answer your questions, please call your gastroenterologist to clarify.  If you requested that your care partner not be given the details of your procedure findings, then the procedure report has been included in a sealed envelope for you to review at your convenience later.  YOU SHOULD EXPECT: Some feelings of bloating in the abdomen. Passage of more gas than usual.  Walking can help get rid of the air that was put into your GI tract during the procedure and reduce the bloating. If you had a lower endoscopy (such as a colonoscopy or flexible sigmoidoscopy) you may notice spotting of blood in your stool or on the toilet paper. If you underwent a bowel prep for your procedure, you may not have a normal bowel movement for a few days.  Please Note:  You might notice some irritation and congestion in your nose or some drainage.  This is from the oxygen used during your procedure.  There is no need for concern and it should clear up in a day or so.  SYMPTOMS TO REPORT IMMEDIATELY:  Following lower endoscopy (colonoscopy or flexible sigmoidoscopy):  Excessive amounts of blood in the stool  Significant tenderness or worsening of abdominal pains  Swelling of the abdomen that is new, acute  Fever of 100F or higher  For urgent or emergent issues, a gastroenterologist can be reached at any hour by calling [phone]. Do not use MyChart messaging for urgent  concerns.    DIET:  We do recommend a small meal at first, but then you may proceed to your regular diet.  Drink plenty of fluids but you should avoid alcoholic beverages for 24 hours.  MEDICATIONS: Continue present medications.  Please see handouts given to you by your recovery nurse: Polyps, Diverticulosis.  FOLLOW UP: Await pathology results. No repeat colonoscopy due to age.  Thank you for allowing [place] to provide for your healthcare needs today.  ACTIVITY:  You should plan to take it easy for the rest of today and you should NOT DRIVE or use heavy machinery until tomorrow (because of the sedation medicines used during the test).    FOLLOW UP: Our staff will call the number listed on your records the next business day following your procedure.  We will call around 7:15- 8:00 am to check on you and address any questions or concerns that you may have regarding the information given to you following your procedure. If we do not reach you, we will leave a message.     If any biopsies were taken you will be contacted by phone or by letter within the next 1-3 weeks.  Please call us at [phone] if you have not heard about the biopsies in 3 weeks.    SIGNATURES/CONFIDENTIALITY: You and/or your care partner have signed paperwork which will be entered into your electronic medical record.  These signatures attest to the fact that  that the information above on your After Visit Summary has been reviewed and is understood.  Full responsibility of the confidentiality of this discharge information lies with you and/or your care-partner.

## 2023-12-14 ENCOUNTER — Telehealth: Payer: Self-pay | Admitting: *Deleted

## 2023-12-14 NOTE — Telephone Encounter (Signed)
  Follow up Call-     12/11/2023    7:47 AM  Call back number  Post procedure Call Back phone  # [phone]  Permission to leave phone message Yes     Patient questions:  Do you have a fever, pain , or abdominal swelling? No. Pain Score  0 *  Have you tolerated food without any problems? Yes.    Have you been able to return to your normal activities? Yes.    Do you have any questions about your discharge instructions: Diet   No. Medications  No. Follow up visit  No.  Do you have questions or concerns about your Care? No.  Actions: * If pain score is 4 or above: No action needed, pain <4.

## 2023-12-15 LAB — SURGICAL PATHOLOGY

## 2023-12-17 ENCOUNTER — Encounter: Payer: Self-pay | Admitting: Internal Medicine

## 2023-12-18 ENCOUNTER — Other Ambulatory Visit: Payer: Self-pay | Admitting: Orthopedic Surgery

## 2023-12-18 DIAGNOSIS — M50123 Cervical disc disorder at C6-C7 level with radiculopathy: Secondary | ICD-10-CM

## 2023-12-18 DIAGNOSIS — M4722 Other spondylosis with radiculopathy, cervical region: Secondary | ICD-10-CM

## 2023-12-22 ENCOUNTER — Encounter: Payer: Self-pay | Admitting: Orthopedic Surgery

## 2023-12-30 ENCOUNTER — Encounter: Payer: Self-pay | Admitting: Family

## 2023-12-30 ENCOUNTER — Ambulatory Visit (INDEPENDENT_AMBULATORY_CARE_PROVIDER_SITE_OTHER): Payer: Medicare HMO | Admitting: Family

## 2023-12-30 DIAGNOSIS — M79672 Pain in left foot: Secondary | ICD-10-CM | POA: Diagnosis not present

## 2023-12-30 DIAGNOSIS — M150 Primary generalized (osteo)arthritis: Secondary | ICD-10-CM

## 2023-12-30 MED ORDER — PREDNISONE 10 MG PO TABS: 10.0000 mg | ORAL_TABLET | Freq: Every day | ORAL | 0 refills | Status: DC

## 2023-12-30 NOTE — Progress Notes (Signed)
Office Visit Note   Patient: Carlos David           Date of Birth: [birth year]           MRN: [id number] Visit Date: 12/30/2023              Requested by: Ardith Dark, MD [address] [place],  [place] [address] PCP: Ardith Dark, MD  Chief Complaint  Patient presents with   Left Foot - Pain      HPI: The patient is a 78 year old gentleman who returns today for left lateral foot pain especially at the base of the fifth metatarsal.  He reports he has a history of arthritis and pain in this area.  He was last seen in the office by Dr. Lajoyce Corners in July of last year at that time he did cut out under the first ray of his orthotic.  He reports that did provide him with good relief of his pain with weightbearing  Since that time he has had return of his symptoms this flareup has lasted several weeks he noted at home that his walking shoes had worn out and has begun wearing some new supportive stiff new balance walking shoes he has had about 50% improvement in his pain since beginning new shoewear with the orthotic Dr. Lajoyce Corners modified  He has used Voltaren gel with modest relief.  He reports he had an injection in the area of his pain several years back which was helpful he wonders if he could have 1 today  Assessment & Plan: Visit Diagnoses: No diagnosis found.  Plan: Will trial him on a low-dose of prednisone for the next 10 days the patient voiced understanding he will continue with his current supportive shoe wear  Follow-Up Instructions: No follow-ups on file.   Ortho Exam  Patient is alert, oriented, no adenopathy, well-dressed, normal affect, normal respiratory effort. On examination of the left foot the patient is tender to palpation at the base of the fifth metatarsal cuboid joint he does have bone-on-bone arthritic changes on radiographs in this area.  He has no erythema no soft tissue swelling no tenderness along the course of the peroneal tendon no pain with resisted  eversion Imaging: No results found. No images are attached to the encounter.  Labs: Lab Results  Component Value Date   HGBA1C 6.2 07/24/2023   HGBA1C 6.2 07/16/2022   HGBA1C 6.1 07/10/2021     Lab Results  Component Value Date   ALBUMIN 4.1 07/24/2023   ALBUMIN 4.2 07/16/2022   ALBUMIN 4.2 07/10/2021    No results found for: "MG" No results found for: "VD25OH"  No results found for: "PREALBUMIN"    Latest Ref Rng & Units 07/24/2023    8:39 AM 07/16/2022    8:46 AM 07/10/2021   10:31 AM  CBC EXTENDED  WBC 4.0 - 10.5 K/uL 5.8  6.2  6.2   RBC 4.22 - 5.81 Mil/uL 4.59  4.75  4.46   Hemoglobin 13.0 - 17.0 g/dL 19.1  47.8  29.5   HCT 39.0 - 52.0 % 41.0  43.7  40.1   Platelets 150.0 - 400.0 K/uL 174.0  168.0  164.0      There is no height or weight on file to calculate BMI.  Orders:  No orders of the defined types were placed in this encounter.  Meds ordered this encounter  Medications   predniSONE (DELTASONE) 10 MG tablet    Sig: Take 1 tablet (10 mg  total) by mouth daily with breakfast.    Dispense:  20 tablet    Refill:  0     Procedures: No procedures performed  Clinical Data: No additional findings.  ROS:  All other systems negative, except as noted in the HPI. Review of Systems  Objective: Vital Signs: There were no vitals taken for this visit.  Specialty Comments:  No specialty comments available.  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 12/26/2008   Dyslipidemia 12/26/2008   Past Medical History:  Diagnosis Date    Actinic keratosis 12/16/2010   Qualifier: Diagnosis of  By: Amador Cunas  MD, Janett Labella    Arthritis    BPH (benign prostatic hyperplasia)    Cancer (HCC)    thyroid cancer 1990   Cataract    both eyes   History of radiation therapy 1990   HYPERLIPIDEMIA 12/26/2008   Hypertension    HYPOTHYROIDISM 12/26/2008   PPD positive    Thoracic aortic aneurysm (HCC)    THYROID CANCER, HX OF 1990   THYROID CANCER, HX OF 12/26/2008   Qualifier: Diagnosis of  By: Amador Cunas  MD, Janett Labella     Family History  Problem Relation Age of Onset   Colon cancer Neg Hx    Rectal cancer Neg Hx    Stomach cancer Neg Hx    Esophageal cancer Neg Hx     Past Surgical History:  Procedure Laterality Date   CATARACT EXTRACTION, BILATERAL  2020   COLONOSCOPY     10-ye ago-normal at [facility]   EYE SURGERY     as child   HAND SURGERY     right hand trigger figger    INGUINAL HERNIA REPAIR Right 02/25/2022   Procedure: OPEN RIGHT INGUINAL HERNIA REPAIR WITH MESH;  Surgeon: Kinsinger, De Blanch, MD;  Location: [facility];  Service: General;  Laterality: Right;   neckotomy  1990   20 lymph nodes removed from left neck   TOTAL THYROIDECTOMY  1990   TRANSURETHRAL RESECTION OF PROSTATE N/A 01/21/2019   Procedure: TRANSURETHRAL RESECTION OF THE PROSTATE (TURP);  Surgeon: Sebastian Ache, MD;  Location: [facility];  Service: Urology;  Laterality: N/A;  40 MINS   Social History   Occupational History   Not on file  Tobacco Use   Smoking status: Former    Current packs/day: 0.00    Types: Cigarettes, Pipe    Quit date: 11/17/1968    Years since quitting: 55.1   Smokeless tobacco: Never  Vaping Use   Vaping status: Never Used  Substance and Sexual Activity   Alcohol use: Yes    Alcohol/week: 0.0 standard drinks of alcohol    Comment: occas. beer   Drug use: No   Sexual activity: Not on file

## 2024-01-03 ENCOUNTER — Ambulatory Visit
Admission: RE | Admit: 2024-01-03 | Discharge: 2024-01-03 | Disposition: A | Discharge status: Home / Self Care | Payer: Medicare HMO | Source: Ambulatory Visit | Attending: Orthopedic Surgery | Admitting: Orthopedic Surgery

## 2024-01-03 DIAGNOSIS — M4802 Spinal stenosis, cervical region: Secondary | ICD-10-CM | POA: Diagnosis not present

## 2024-01-03 DIAGNOSIS — M4722 Other spondylosis with radiculopathy, cervical region: Secondary | ICD-10-CM

## 2024-01-03 DIAGNOSIS — M50123 Cervical disc disorder at C6-C7 level with radiculopathy: Secondary | ICD-10-CM

## 2024-01-03 DIAGNOSIS — M5412 Radiculopathy, cervical region: Secondary | ICD-10-CM | POA: Diagnosis not present

## 2024-01-05 ENCOUNTER — Ambulatory Visit: Payer: Medicare HMO | Admitting: Orthopedic Surgery

## 2024-01-11 ENCOUNTER — Ambulatory Visit (INDEPENDENT_AMBULATORY_CARE_PROVIDER_SITE_OTHER): Payer: Medicare HMO | Admitting: Orthopedic Surgery

## 2024-01-11 ENCOUNTER — Encounter: Payer: Self-pay | Admitting: Orthopedic Surgery

## 2024-01-11 DIAGNOSIS — M150 Primary generalized (osteo)arthritis: Secondary | ICD-10-CM | POA: Diagnosis not present

## 2024-01-11 DIAGNOSIS — M79672 Pain in left foot: Secondary | ICD-10-CM

## 2024-01-11 MED ORDER — COLCHICINE 0.6 MG PO TABS: 0.6000 mg | ORAL_TABLET | Freq: Every day | ORAL | 3 refills | Status: DC

## 2024-01-11 NOTE — Progress Notes (Signed)
 Office Visit Note   Patient: Carlos David           Date of Birth: 22-Jul-1946           MRN: [id number] Visit Date: 01/11/2024              Requested by: Ardith Dark, MD [address] [place],  [place] [address] PCP: Ardith Dark, MD  Chief Complaint  Patient presents with   Left Foot - Pain      HPI: Patient is a 78 year old gentleman who presents in follow-up for osteoarthritis pain base of the fifth metatarsal left foot.  Patient has tried prednisone without relief.  Patient did have interval relief with allowing for plantarflexion of the first ray by modifying his orthotics however he states he is still symptomatic.  Assessment & Plan: Visit Diagnoses:  1. Primary osteoarthritis involving multiple joints   2. Pain in left foot     Plan: Recommended trial of a Voltaren gel or a Voltaren patch on the dorsum of his foot.  Continue with the stiff soled sneaker and plantarflexion first ray modification to the orthotics.  Follow-Up Instructions: No follow-ups on file.   Ortho Exam  Patient is alert, oriented, no adenopathy, well-dressed, normal affect, normal respiratory effort. Examination patient has hallux rigidus right great toe with essentially no range of motion of the great toe MTP joint.  Patient is point tender to palpation of the base of the fifth metatarsal.  Radiograph shows arthritic collapse of the base of the fifth metatarsal as well as bone-on-bone collapse of the great toe MTP joint.  Patient has multiple other joints involved with the osteoarthritis including the PIP and DIP joints of his fingers and the lumbar and cervical spine.  Imaging: No results found. No images are attached to the encounter.  Labs: Lab Results  Component Value Date   HGBA1C 6.2 07/24/2023   HGBA1C 6.2 07/16/2022   HGBA1C 6.1 07/10/2021     Lab Results  Component Value Date   ALBUMIN 4.1 07/24/2023   ALBUMIN 4.2 07/16/2022   ALBUMIN 4.2 07/10/2021    No results  found for: "MG" No results found for: "VD25OH"  No results found for: "PREALBUMIN"    Latest Ref Rng & Units 07/24/2023    8:39 AM 07/16/2022    8:46 AM 07/10/2021   10:31 AM  CBC EXTENDED  WBC 4.0 - 10.5 K/uL 5.8  6.2  6.2   RBC 4.22 - 5.81 Mil/uL 4.59  4.75  4.46   Hemoglobin 13.0 - 17.0 g/dL 59.5  63.8  75.6   HCT 39.0 - 52.0 % 41.0  43.7  40.1   Platelets 150.0 - 400.0 K/uL 174.0  168.0  164.0      There is no height or weight on file to calculate BMI.  Orders:  No orders of the defined types were placed in this encounter.  Meds ordered this encounter  Medications   colchicine 0.6 MG tablet    Sig: Take 1 tablet (0.6 mg total) by mouth daily.    Dispense:  90 tablet    Refill:  3     Procedures: No procedures performed  Clinical Data: No additional findings.  ROS:  All other systems negative, except as noted in the HPI. Review of Systems  Objective: Vital Signs: There were no vitals taken for this visit.  Specialty Comments:  No specialty comments available.  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 12/17/2018   Erectile dysfunction 12/17/2018   Neck pain 09/15/2018   Lumbar radiculopathy 10/14/2016   BPH s/p TURP 2020 03/02/2014   Osteoarthritis 07/16/2012   Hypothyroidism s/p thyroidectomy 1990 due to thyroid cancer 12/26/2008   Dyslipidemia 12/26/2008   Past Medical History:  Diagnosis Date   Actinic keratosis 12/16/2010   Qualifier: Diagnosis of  By: Amador Cunas  MD, Janett Labella    Arthritis    BPH (benign prostatic hyperplasia)    Cancer (HCC)    thyroid cancer 1990   Cataract    both eyes   History of radiation therapy 1990   HYPERLIPIDEMIA 12/26/2008   Hypertension    HYPOTHYROIDISM 12/26/2008   PPD positive    Thoracic aortic aneurysm  (HCC)    THYROID CANCER, HX OF 1990   THYROID CANCER, HX OF 12/26/2008   Qualifier: Diagnosis of  By: Amador Cunas  MD, Janett Labella     Family History  Problem Relation Age of Onset   Colon cancer Neg Hx    Rectal cancer Neg Hx    Stomach cancer Neg Hx    Esophageal cancer Neg Hx     Past Surgical History:  Procedure Laterality Date   CATARACT EXTRACTION, BILATERAL  2020   COLONOSCOPY     10-ye ago-normal at [facility]   EYE SURGERY     as child   HAND SURGERY     right hand trigger figger    INGUINAL HERNIA REPAIR Right 02/25/2022   Procedure: OPEN RIGHT INGUINAL HERNIA REPAIR WITH MESH;  Surgeon: Kinsinger, De Blanch, MD;  Location: [facility];  Service: General;  Laterality: Right;   neckotomy  1990   20 lymph nodes removed from left neck   TOTAL THYROIDECTOMY  1990   TRANSURETHRAL RESECTION OF PROSTATE N/A 01/21/2019   Procedure: TRANSURETHRAL RESECTION OF THE PROSTATE (TURP);  Surgeon: Sebastian Ache, MD;  Location: [facility];  Service: Urology;  Laterality: N/A;  41 MINS   Social History   Occupational History   Not on file  Tobacco Use   Smoking status: Former    Current packs/day: 0.00    Types: Cigarettes, Pipe    Quit date: 11/17/1968    Years since quitting: 55.1   Smokeless tobacco: Never  Vaping Use   Vaping status: Never Used  Substance and Sexual Activity   Alcohol use: Yes    Alcohol/week: 0.0 standard drinks of alcohol    Comment: occas. beer   Drug use: No   Sexual activity: Not on file

## 2024-01-12 ENCOUNTER — Other Ambulatory Visit: Payer: Self-pay | Admitting: Orthopedic Surgery

## 2024-01-12 ENCOUNTER — Telehealth: Payer: Self-pay | Admitting: Orthopedic Surgery

## 2024-01-12 MED ORDER — COLCHICINE 0.6 MG PO TABS: 0.6000 mg | ORAL_TABLET | Freq: Every day | ORAL | 3 refills | Status: DC

## 2024-01-12 NOTE — Telephone Encounter (Signed)
 Patient called advised the medication is not showing called into the pharmacy yet. Patient asked if he can get a call when the medicine is sent to the pharmacy. The number to contact patient is [phone]

## 2024-01-12 NOTE — Telephone Encounter (Signed)
 It was automatically being printed instead of e-faxing to [facility]. I reordered and it was [facility] successfully. Pt informed.

## 2024-01-15 DIAGNOSIS — M4722 Other spondylosis with radiculopathy, cervical region: Secondary | ICD-10-CM | POA: Diagnosis not present

## 2024-01-15 DIAGNOSIS — M50123 Cervical disc disorder at C6-C7 level with radiculopathy: Secondary | ICD-10-CM | POA: Diagnosis not present

## 2024-01-20 ENCOUNTER — Other Ambulatory Visit (INDEPENDENT_AMBULATORY_CARE_PROVIDER_SITE_OTHER): Payer: Self-pay

## 2024-01-20 ENCOUNTER — Other Ambulatory Visit: Payer: Self-pay

## 2024-01-20 ENCOUNTER — Ambulatory Visit (INDEPENDENT_AMBULATORY_CARE_PROVIDER_SITE_OTHER): Payer: Medicare HMO | Admitting: Orthopedic Surgery

## 2024-01-20 DIAGNOSIS — M25551 Pain in right hip: Secondary | ICD-10-CM

## 2024-01-20 DIAGNOSIS — M7061 Trochanteric bursitis, right hip: Secondary | ICD-10-CM

## 2024-01-21 ENCOUNTER — Encounter: Payer: Self-pay | Admitting: Orthopedic Surgery

## 2024-01-24 MED ORDER — BUPIVACAINE HCL 0.25 % IJ SOLN
4.0000 mL | INTRAMUSCULAR | Status: AC | PRN
Start: 2024-01-20 — End: 2024-01-20
  Administered 2024-01-20: 4 mL via INTRA_ARTICULAR

## 2024-01-24 MED ORDER — LIDOCAINE HCL 1 % IJ SOLN
5.0000 mL | INTRAMUSCULAR | Status: AC | PRN
Start: 2024-01-20 — End: 2024-01-20
  Administered 2024-01-20: 5 mL

## 2024-01-24 MED ORDER — METHYLPREDNISOLONE ACETATE 80 MG/ML IJ SUSP
80.0000 mg | INTRAMUSCULAR | Status: AC | PRN
  Administered 2024-01-20: 80 mg via INTRA_ARTICULAR

## 2024-01-24 NOTE — Progress Notes (Signed)
 Office Visit Note   Patient: Carlos David           Date of Birth: [birth year]           MRN: [id number] Visit Date: 01/20/2024 Requested by: Cammy Copa, MD [address] [place],  [place] [address] PCP: Ardith Dark, MD  Subjective: Chief Complaint  Patient presents with   Right Hip - Pain    HPI: Carlos David is a 78 y.o. male who presents to the office reporting right hip pain.  He describes bursitis type symptoms which she has had on that lateral aspect of the right hip.  Been going on several weeks.  Patient states he has a history of arthritis "all over".  Had an injection years ago and it helped at that time.  Bilateral knee injections also have helped him.  It is difficult for him to sleep on that right-hand side.  Describes groin pain as well as lateral sided pain.  Cervical spine ESI is pending..                ROS: All systems reviewed are negative as they relate to the chief complaint within the history of present illness.  Patient denies fevers or chills.  Assessment & Plan: Visit Diagnoses:  1. Pain of right hip     Plan: Impression is right hip bursitis with mild arthritis on plain radiographs.  Ultrasound-guided injection into the bursa performed today.  C-spine ESI pending for other issues.  Will see how that helps.  Could consider a right hip intra-articular injection if the relief is not sustained.  He did do well with bilateral knee injections performed last clinic visit.  Follow-Up Instructions: No follow-ups on file.   Orders:  Orders Placed This Encounter  Procedures   XR HIP UNILAT W OR W/O PELVIS 2-3 VIEWS RIGHT   US Guided Needle Placement - No Linked Charges   No orders of the defined types were placed in this encounter.     Procedures: Large Joint Inj: R greater trochanter on 01/20/2024 11:55 AM Indications: pain and diagnostic evaluation Details: 22 G 3.5 in needle, ultrasound-guided lateral approach  Arthrogram: No  Medications:  5 mL lidocaine 1 %; 80 mg methylPREDNISolone acetate 80 MG/ML; 4 mL bupivacaine 0.25 % Outcome: tolerated well, no immediate complications Procedure, treatment alternatives, risks and benefits explained, specific risks discussed. Consent was given by the patient. Immediately prior to procedure a time out was called to verify the correct patient, procedure, equipment, support staff and site/side marked as required. Patient was prepped and draped in the usual sterile fashion.       Clinical Data: No additional findings.  Objective: Vital Signs: There were no vitals taken for this visit.  Physical Exam:  Constitutional: Patient appears well-developed HEENT:  Head: Normocephalic Eyes:EOM are normal Neck: Normal range of motion Cardiovascular: Normal rate Pulmonary/chest: Effort normal Neurologic: Patient is alert Skin: Skin is warm Psychiatric: Patient has normal mood and affect  Ortho Exam: Ortho exam demonstrates slight Trendelenburg gait to the right.  Does have tenderness to palpation of the trochanteric bursa.  Is able to maintain a level pelvis when standing on either leg.  No significant groin pain on the left with internal/external rotation of the leg.  Mild pain on the right with internal/external rotation of that right leg.  No effusion in either knee.  Specialty Comments:  No specialty comments available.  Imaging: No results found.   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 12/17/2018   Erectile dysfunction 12/17/2018   Neck pain 09/15/2018   Lumbar radiculopathy 10/14/2016   BPH s/p TURP 2020 03/02/2014   Osteoarthritis 07/16/2012   Hypothyroidism s/p thyroidectomy 1990 due to thyroid cancer 12/26/2008   Dyslipidemia 12/26/2008   Past Medical History:   Diagnosis Date   Actinic keratosis 12/16/2010   Qualifier: Diagnosis of  By: Amador Cunas  MD, Janett Labella    Arthritis    BPH (benign prostatic hyperplasia)    Cancer (HCC)    thyroid cancer 1990   Cataract    both eyes   History of radiation therapy 1990   HYPERLIPIDEMIA 12/26/2008   Hypertension    HYPOTHYROIDISM 12/26/2008   PPD positive    Thoracic aortic aneurysm (HCC)    THYROID CANCER, HX OF 1990   THYROID CANCER, HX OF 12/26/2008   Qualifier: Diagnosis of  By: Amador Cunas  MD, Janett Labella     Family History  Problem Relation Age of Onset   Colon cancer Neg Hx    Rectal cancer Neg Hx    Stomach cancer Neg Hx    Esophageal cancer Neg Hx     Past Surgical History:  Procedure Laterality Date   CATARACT EXTRACTION, BILATERAL  2020   COLONOSCOPY     10-ye ago-normal at [facility]   EYE SURGERY     as child   HAND SURGERY     right hand trigger figger    INGUINAL HERNIA REPAIR Right 02/25/2022   Procedure: OPEN RIGHT INGUINAL HERNIA REPAIR WITH MESH;  Surgeon: Kinsinger, De Blanch, MD;  Location: [facility];  Service: General;  Laterality: Right;   neckotomy  1990   20 lymph nodes removed from left neck   TOTAL THYROIDECTOMY  1990   TRANSURETHRAL RESECTION OF PROSTATE N/A 01/21/2019   Procedure: TRANSURETHRAL RESECTION OF THE PROSTATE (TURP);  Surgeon: Sebastian Ache, MD;  Location: [facility];  Service: Urology;  Laterality: N/A;  86 MINS   Social History   Occupational History   Not on file  Tobacco Use   Smoking status: Former    Current packs/day: 0.00    Types: Cigarettes, Pipe    Quit date: 11/17/1968    Years since quitting: 55.2   Smokeless tobacco: Never  Vaping Use   Vaping status: Never Used  Substance and Sexual Activity   Alcohol use: Yes    Alcohol/week: 0.0 standard drinks of alcohol    Comment: occas. beer   Drug use: No   Sexual activity: Not on file

## 2024-02-01 DIAGNOSIS — M5412 Radiculopathy, cervical region: Secondary | ICD-10-CM | POA: Diagnosis not present

## 2024-02-03 ENCOUNTER — Ambulatory Visit: Admitting: Orthopedic Surgery

## 2024-02-08 ENCOUNTER — Ambulatory Visit: Admitting: Orthopedic Surgery

## 2024-02-16 DIAGNOSIS — L570 Actinic keratosis: Secondary | ICD-10-CM | POA: Diagnosis not present

## 2024-02-16 DIAGNOSIS — C44612 Basal cell carcinoma of skin of right upper limb, including shoulder: Secondary | ICD-10-CM | POA: Diagnosis not present

## 2024-02-16 DIAGNOSIS — Z08 Encounter for follow-up examination after completed treatment for malignant neoplasm: Secondary | ICD-10-CM | POA: Diagnosis not present

## 2024-02-16 DIAGNOSIS — L821 Other seborrheic keratosis: Secondary | ICD-10-CM | POA: Diagnosis not present

## 2024-02-16 DIAGNOSIS — L814 Other melanin hyperpigmentation: Secondary | ICD-10-CM | POA: Diagnosis not present

## 2024-02-16 DIAGNOSIS — L578 Other skin changes due to chronic exposure to nonionizing radiation: Secondary | ICD-10-CM | POA: Diagnosis not present

## 2024-02-16 DIAGNOSIS — L301 Dyshidrosis [pompholyx]: Secondary | ICD-10-CM | POA: Diagnosis not present

## 2024-02-16 DIAGNOSIS — D485 Neoplasm of uncertain behavior of skin: Secondary | ICD-10-CM | POA: Diagnosis not present

## 2024-02-16 DIAGNOSIS — D225 Melanocytic nevi of trunk: Secondary | ICD-10-CM | POA: Diagnosis not present

## 2024-02-16 DIAGNOSIS — Z86006 Personal history of melanoma in-situ: Secondary | ICD-10-CM | POA: Diagnosis not present

## 2024-02-22 DIAGNOSIS — M50123 Cervical disc disorder at C6-C7 level with radiculopathy: Secondary | ICD-10-CM | POA: Diagnosis not present

## 2024-02-22 DIAGNOSIS — M791 Myalgia, unspecified site: Secondary | ICD-10-CM | POA: Diagnosis not present

## 2024-02-22 DIAGNOSIS — M4316 Spondylolisthesis, lumbar region: Secondary | ICD-10-CM | POA: Diagnosis not present

## 2024-02-22 DIAGNOSIS — M47816 Spondylosis without myelopathy or radiculopathy, lumbar region: Secondary | ICD-10-CM | POA: Diagnosis not present

## 2024-02-24 ENCOUNTER — Encounter: Payer: Self-pay | Admitting: Cardiology

## 2024-02-25 ENCOUNTER — Ambulatory Visit (INDEPENDENT_AMBULATORY_CARE_PROVIDER_SITE_OTHER): Admitting: Orthopedic Surgery

## 2024-02-25 DIAGNOSIS — M79672 Pain in left foot: Secondary | ICD-10-CM | POA: Diagnosis not present

## 2024-02-25 NOTE — Telephone Encounter (Signed)
 That is fine to move up echo

## 2024-03-08 ENCOUNTER — Ambulatory Visit (HOSPITAL_COMMUNITY)

## 2024-03-09 ENCOUNTER — Other Ambulatory Visit: Payer: Self-pay | Admitting: Cardiology

## 2024-03-10 ENCOUNTER — Other Ambulatory Visit: Payer: Self-pay | Admitting: Cardiology

## 2024-03-15 ENCOUNTER — Ambulatory Visit: Payer: Self-pay | Admitting: Podiatry

## 2024-03-15 ENCOUNTER — Encounter: Payer: Self-pay | Admitting: Podiatry

## 2024-03-15 DIAGNOSIS — M7671 Peroneal tendinitis, right leg: Secondary | ICD-10-CM

## 2024-03-15 DIAGNOSIS — M7672 Peroneal tendinitis, left leg: Secondary | ICD-10-CM

## 2024-03-15 NOTE — Progress Notes (Signed)
 He presents today for follow-up of his peroneal tendinitis left patient states that he has been wanting a second opinion he did see Dr. Julio Ohm and has seen Dr. Michalene Agee in the past as well.  Gave a prescription for gout which did not do anything for him.  He said he had temporary relief with the injections but really nothing long-term.  He is adjusted over-the-counter orthotics for him and he says that is not even working any longer.  Objective: Vital signs stable alert oriented x 3.  Pulses are palpable.  He has large nonpulsatile mass dorsal and plantar aspect of the fourth fifth tarsometatarsal cuboid articulation on the left foot.  This is where the majority of his symptomatology lies.  Not having a lot of tenderness on either of the peroneus longus or the peroneus brevis tendons at the arcuate portion or its insertion on the fifth met base.  The majority the pain is within the joint itself.  Assessment: Osteoarthritis capsulitis left foot.  Plan: Discussed etiology pathology and surgical therapies he had an appointment with rheumatology and canceled it.  He has severe degenerative type arthritis all over.  He probably needs to follow-up with rheumatology to make sure that there is nothing else that he can do.  I did recommend orthotics and we will do that should rheumatology not be a success.

## 2024-03-16 DIAGNOSIS — M47816 Spondylosis without myelopathy or radiculopathy, lumbar region: Secondary | ICD-10-CM | POA: Diagnosis not present

## 2024-03-20 ENCOUNTER — Encounter: Payer: Self-pay | Admitting: Orthopedic Surgery

## 2024-03-20 DIAGNOSIS — M79672 Pain in left foot: Secondary | ICD-10-CM

## 2024-03-20 MED ORDER — METHYLPREDNISOLONE ACETATE 40 MG/ML IJ SUSP
40.0000 mg | INTRAMUSCULAR | Status: AC | PRN
  Administered 2024-03-20: 40 mg via INTRA_ARTICULAR

## 2024-03-20 MED ORDER — LIDOCAINE HCL 1 % IJ SOLN
1.0000 mL | INTRAMUSCULAR | Status: AC | PRN
  Administered 2024-03-20: 1 mL

## 2024-03-20 NOTE — Progress Notes (Signed)
 Office Visit Note   Patient: Carlos David           Date of Birth: 1946/06/16           MRN: [id number] Visit Date: 02/25/2024              Requested by: Rodney Clamp, MD [address] [place],  [place] [address] PCP: Rodney Clamp, MD  Chief Complaint  Patient presents with   Left Foot - Pain      HPI: Patient is a 78 year old gentleman who presents with dorsal lateral left foot pain.  Patient states he tried Voltaren  gel and stiff sneakers without relief.  Assessment & Plan: Visit Diagnoses:  1. Pain in left foot     Plan: The base of the 4th and 5th metatarsals of the cuboid joint was injected.  Continue stiff soled new balance sneakers.  Follow-Up Instructions: Return if symptoms worsen or fail to improve.   Ortho Exam  Patient is alert, oriented, no adenopathy, well-dressed, normal affect, normal respiratory effort. Examination patient has good pulses no ischemic changes.  He has good ankle and subtalar range of motion.  Patient's pain is reproduced to palpation of the base of the 4th and 5th metatarsals.  Imaging: No results found. No images are attached to the encounter.  Labs: Lab Results  Component Value Date   HGBA1C 6.2 07/24/2023   HGBA1C 6.2 07/16/2022   HGBA1C 6.1 07/10/2021     Lab Results  Component Value Date   ALBUMIN 4.1 07/24/2023   ALBUMIN 4.2 07/16/2022   ALBUMIN 4.2 07/10/2021    No results found for: "MG" No results found for: "VD25OH"  No results found for: "PREALBUMIN"    Latest Ref Rng & Units 07/24/2023    8:39 AM 07/16/2022    8:46 AM 07/10/2021   10:31 AM  CBC EXTENDED  WBC 4.0 - 10.5 K/uL 5.8  6.2  6.2   RBC 4.22 - 5.81 Mil/uL 4.59  4.75  4.46   Hemoglobin 13.0 - 17.0 g/dL 19.1  47.8  29.5   HCT 39.0 - 52.0 % 41.0  43.7  40.1   Platelets 150.0 - 400.0 K/uL 174.0  168.0  164.0      There is no height or weight on file to calculate BMI.  Orders:  No orders of the defined types were placed in this  encounter.  No orders of the defined types were placed in this encounter.    Procedures: Small Joint Inj: L intertarsal on 03/20/2024 11:00 AM Indications: pain and diagnostic evaluation Details: 22 G needle  Spinal Needle: No  Medications: 1 mL lidocaine  1 %; 40 mg methylPREDNISolone  acetate 40 MG/ML Outcome: tolerated well, no immediate complications Procedure, treatment alternatives, risks and benefits explained, specific risks discussed. Consent was given by the patient. Immediately prior to procedure a time out was called to verify the correct patient, procedure, equipment, support staff and site/side marked as required. Patient was prepped and draped in the usual sterile fashion.      Clinical Data: No additional findings.  ROS:  All other systems negative, except as noted in the HPI. Review of Systems  Objective: Vital Signs: There were no vitals taken for this visit.  Specialty Comments:  No specialty comments available.  PMFS History: Patient Active Problem List   Diagnosis Date Noted   Allergic rhinitis 08/08/2022   Carpal tunnel syndrome 03/06/2020   Essential hypertension 12/17/2018   Chronic Low Back secondary to spinal stenosis  and degenerative changes 12/17/2018   Erectile dysfunction 12/17/2018   Lumbar radiculopathy 10/14/2016   BPH s/p TURP 2020 03/02/2014   Osteoarthritis 07/16/2012   Hypothyroidism s/p thyroidectomy 1990 due to thyroid  cancer 12/26/2008   Dyslipidemia 12/26/2008   Past Medical History:  Diagnosis Date   Actinic keratosis 12/16/2010   Qualifier: Diagnosis of  By: Minnette Amato  MD, Ronie Cohen    Arthritis    BPH (benign prostatic hyperplasia)    Cancer (HCC)    thyroid  cancer 1990   Cataract    both eyes   History of radiation therapy 1990   HYPERLIPIDEMIA 12/26/2008   Hypertension    HYPOTHYROIDISM 12/26/2008   PPD positive    Thoracic aortic aneurysm (HCC)    THYROID  CANCER, HX OF 1990   THYROID  CANCER, HX OF 12/26/2008    Qualifier: Diagnosis of  By: Minnette Amato  MD, Ronie Cohen     Family History  Problem Relation Age of Onset   Colon cancer Neg Hx    Rectal cancer Neg Hx    Stomach cancer Neg Hx    Esophageal cancer Neg Hx     Past Surgical History:  Procedure Laterality Date   CATARACT EXTRACTION, BILATERAL  2020   COLONOSCOPY     10-ye ago-normal at [facility]   EYE SURGERY     as child   HAND SURGERY     right hand trigger figger    INGUINAL HERNIA REPAIR Right 02/25/2022   Procedure: OPEN RIGHT INGUINAL HERNIA REPAIR WITH MESH;  Surgeon: Kinsinger, Alphonso Aschoff, MD;  Location: [facility];  Service: General;  Laterality: Right;   neckotomy  1990   20 lymph nodes removed from left neck   TOTAL THYROIDECTOMY  1990   TRANSURETHRAL RESECTION OF PROSTATE N/A 01/21/2019   Procedure: TRANSURETHRAL RESECTION OF THE PROSTATE (TURP);  Surgeon: Osborn Blaze, MD;  Location: [facility];  Service: Urology;  Laterality: N/A;  24 MINS   Social History   Occupational History   Not on file  Tobacco Use   Smoking status: Former    Current packs/day: 0.00    Types: Cigarettes, Pipe    Quit date: 11/17/1968    Years since quitting: 55.3   Smokeless tobacco: Never  Vaping Use   Vaping status: Never Used  Substance and Sexual Activity   Alcohol use: Yes    Alcohol/week: 0.0 standard drinks of alcohol    Comment: occas. beer   Drug use: No   Sexual activity: Not on file

## 2024-03-28 ENCOUNTER — Ambulatory Visit: Admitting: Orthopedic Surgery

## 2024-03-28 DIAGNOSIS — M47816 Spondylosis without myelopathy or radiculopathy, lumbar region: Secondary | ICD-10-CM | POA: Diagnosis not present

## 2024-03-28 DIAGNOSIS — Z1330 Encounter for screening examination for mental health and behavioral disorders, unspecified: Secondary | ICD-10-CM | POA: Diagnosis not present

## 2024-03-28 DIAGNOSIS — M791 Myalgia, unspecified site: Secondary | ICD-10-CM | POA: Diagnosis not present

## 2024-04-04 ENCOUNTER — Other Ambulatory Visit: Payer: Self-pay | Admitting: Cardiology

## 2024-04-06 DIAGNOSIS — D3131 Benign neoplasm of right choroid: Secondary | ICD-10-CM | POA: Diagnosis not present

## 2024-04-07 ENCOUNTER — Ambulatory Visit (HOSPITAL_COMMUNITY)
Admission: RE | Admit: 2024-04-07 | Discharge: 2024-04-07 | Disposition: A | Discharge status: Home / Self Care | Source: Ambulatory Visit | Attending: Cardiovascular Disease | Admitting: Cardiovascular Disease

## 2024-04-07 ENCOUNTER — Encounter (HOSPITAL_BASED_OUTPATIENT_CLINIC_OR_DEPARTMENT_OTHER): Payer: Self-pay | Admitting: Cardiology

## 2024-04-07 ENCOUNTER — Ambulatory Visit: Payer: Self-pay | Admitting: Cardiology

## 2024-04-07 ENCOUNTER — Encounter: Payer: Self-pay | Admitting: Family Medicine

## 2024-04-07 DIAGNOSIS — I2510 Atherosclerotic heart disease of native coronary artery without angina pectoris: Secondary | ICD-10-CM | POA: Diagnosis not present

## 2024-04-07 DIAGNOSIS — I77819 Aortic ectasia, unspecified site: Secondary | ICD-10-CM | POA: Insufficient documentation

## 2024-04-07 LAB — ECHOCARDIOGRAM COMPLETE
Area-P 1/2: 2.12 cm2
S' Lateral: 4.2 cm

## 2024-04-08 ENCOUNTER — Other Ambulatory Visit: Payer: Self-pay | Admitting: *Deleted

## 2024-04-08 DIAGNOSIS — I77819 Aortic ectasia, unspecified site: Secondary | ICD-10-CM

## 2024-04-08 DIAGNOSIS — I2510 Atherosclerotic heart disease of native coronary artery without angina pectoris: Secondary | ICD-10-CM

## 2024-04-08 DIAGNOSIS — C44612 Basal cell carcinoma of skin of right upper limb, including shoulder: Secondary | ICD-10-CM | POA: Diagnosis not present

## 2024-04-08 NOTE — Telephone Encounter (Signed)
**Note De-identified  [place] Obfuscation** Please advise 

## 2024-04-08 NOTE — Progress Notes (Signed)
 Called and made patient aware per Dr. Alda Amas ECHO in one year. Understanding verbalized

## 2024-04-08 NOTE — Telephone Encounter (Signed)
 Ok to place referral  Jinny Mounts. Daneil Dunker, MD 04/08/2024 10:10 AM

## 2024-04-12 ENCOUNTER — Other Ambulatory Visit: Payer: Self-pay | Admitting: *Deleted

## 2024-04-12 DIAGNOSIS — M545 Low back pain, unspecified: Secondary | ICD-10-CM

## 2024-04-12 DIAGNOSIS — M150 Primary generalized (osteo)arthritis: Secondary | ICD-10-CM

## 2024-04-12 NOTE — Telephone Encounter (Signed)
 Referral rheumatology placed

## 2024-04-14 DIAGNOSIS — M5416 Radiculopathy, lumbar region: Secondary | ICD-10-CM | POA: Diagnosis not present

## 2024-04-14 DIAGNOSIS — M48062 Spinal stenosis, lumbar region with neurogenic claudication: Secondary | ICD-10-CM | POA: Diagnosis not present

## 2024-04-15 ENCOUNTER — Telehealth: Payer: Self-pay

## 2024-04-15 NOTE — Telephone Encounter (Signed)
 Copied from CRM [id number]. Topic: Referral - Status >> Apr 15, 2024  3:36 PM Earnestine Goes B wrote: Reason for CRM: pt called to follow up on referral to a rheumatologist. Please call pt with a update at [phone]   I returned pt's call,pt was given [facility]'s telephone number to call and schedule.

## 2024-04-19 ENCOUNTER — Ambulatory Visit

## 2024-04-21 ENCOUNTER — Other Ambulatory Visit (HOSPITAL_COMMUNITY): Payer: Medicare HMO

## 2024-04-26 DIAGNOSIS — M4807 Spinal stenosis, lumbosacral region: Secondary | ICD-10-CM | POA: Diagnosis not present

## 2024-04-26 DIAGNOSIS — M4316 Spondylolisthesis, lumbar region: Secondary | ICD-10-CM | POA: Diagnosis not present

## 2024-04-26 DIAGNOSIS — M48061 Spinal stenosis, lumbar region without neurogenic claudication: Secondary | ICD-10-CM | POA: Diagnosis not present

## 2024-04-26 DIAGNOSIS — M5126 Other intervertebral disc displacement, lumbar region: Secondary | ICD-10-CM | POA: Diagnosis not present

## 2024-05-02 DIAGNOSIS — S32030A Wedge compression fracture of third lumbar vertebra, initial encounter for closed fracture: Secondary | ICD-10-CM | POA: Diagnosis not present

## 2024-05-02 DIAGNOSIS — M48062 Spinal stenosis, lumbar region with neurogenic claudication: Secondary | ICD-10-CM | POA: Diagnosis not present

## 2024-05-02 DIAGNOSIS — M5416 Radiculopathy, lumbar region: Secondary | ICD-10-CM | POA: Diagnosis not present

## 2024-05-13 DIAGNOSIS — R3914 Feeling of incomplete bladder emptying: Secondary | ICD-10-CM | POA: Diagnosis not present

## 2024-05-13 DIAGNOSIS — N401 Enlarged prostate with lower urinary tract symptoms: Secondary | ICD-10-CM | POA: Diagnosis not present

## 2024-05-17 DIAGNOSIS — M48062 Spinal stenosis, lumbar region with neurogenic claudication: Secondary | ICD-10-CM | POA: Diagnosis not present

## 2024-05-17 DIAGNOSIS — M5416 Radiculopathy, lumbar region: Secondary | ICD-10-CM | POA: Diagnosis not present

## 2024-05-23 DIAGNOSIS — M79672 Pain in left foot: Secondary | ICD-10-CM | POA: Diagnosis not present

## 2024-05-23 DIAGNOSIS — M7672 Peroneal tendinitis, left leg: Secondary | ICD-10-CM | POA: Diagnosis not present

## 2024-05-23 DIAGNOSIS — M19072 Primary osteoarthritis, left ankle and foot: Secondary | ICD-10-CM | POA: Diagnosis not present

## 2024-05-24 DIAGNOSIS — M48062 Spinal stenosis, lumbar region with neurogenic claudication: Secondary | ICD-10-CM | POA: Diagnosis not present

## 2024-05-26 ENCOUNTER — Other Ambulatory Visit: Payer: Self-pay

## 2024-05-26 ENCOUNTER — Emergency Department (HOSPITAL_COMMUNITY)

## 2024-05-26 ENCOUNTER — Ambulatory Visit

## 2024-05-26 ENCOUNTER — Emergency Department (HOSPITAL_COMMUNITY)
Admission: EM | Admit: 2024-05-26 | Discharge: 2024-05-27 | Disposition: A | Discharge status: Home / Self Care | Attending: Emergency Medicine | Admitting: Emergency Medicine

## 2024-05-26 DIAGNOSIS — S32030A Wedge compression fracture of third lumbar vertebra, initial encounter for closed fracture: Secondary | ICD-10-CM

## 2024-05-26 DIAGNOSIS — Z7982 Long term (current) use of aspirin: Secondary | ICD-10-CM | POA: Insufficient documentation

## 2024-05-26 DIAGNOSIS — M545 Low back pain, unspecified: Secondary | ICD-10-CM | POA: Diagnosis not present

## 2024-05-26 DIAGNOSIS — W010XXA Fall on same level from slipping, tripping and stumbling without subsequent striking against object, initial encounter: Secondary | ICD-10-CM | POA: Diagnosis not present

## 2024-05-26 DIAGNOSIS — M4800 Spinal stenosis, site unspecified: Secondary | ICD-10-CM

## 2024-05-26 DIAGNOSIS — S32038A Other fracture of third lumbar vertebra, initial encounter for closed fracture: Secondary | ICD-10-CM | POA: Insufficient documentation

## 2024-05-26 DIAGNOSIS — M5134 Other intervertebral disc degeneration, thoracic region: Secondary | ICD-10-CM | POA: Diagnosis not present

## 2024-05-26 DIAGNOSIS — I10 Essential (primary) hypertension: Secondary | ICD-10-CM | POA: Diagnosis not present

## 2024-05-26 DIAGNOSIS — W19XXXA Unspecified fall, initial encounter: Secondary | ICD-10-CM | POA: Diagnosis not present

## 2024-05-26 DIAGNOSIS — M549 Dorsalgia, unspecified: Secondary | ICD-10-CM | POA: Diagnosis not present

## 2024-05-26 DIAGNOSIS — Z79899 Other long term (current) drug therapy: Secondary | ICD-10-CM | POA: Diagnosis not present

## 2024-05-26 DIAGNOSIS — M48061 Spinal stenosis, lumbar region without neurogenic claudication: Secondary | ICD-10-CM | POA: Insufficient documentation

## 2024-05-26 DIAGNOSIS — Z85850 Personal history of malignant neoplasm of thyroid: Secondary | ICD-10-CM | POA: Diagnosis not present

## 2024-05-26 DIAGNOSIS — S3992XA Unspecified injury of lower back, initial encounter: Secondary | ICD-10-CM | POA: Diagnosis present

## 2024-05-26 DIAGNOSIS — M40204 Unspecified kyphosis, thoracic region: Secondary | ICD-10-CM | POA: Diagnosis not present

## 2024-05-26 DIAGNOSIS — E039 Hypothyroidism, unspecified: Secondary | ICD-10-CM | POA: Diagnosis not present

## 2024-05-26 DIAGNOSIS — M4856XA Collapsed vertebra, not elsewhere classified, lumbar region, initial encounter for fracture: Secondary | ICD-10-CM | POA: Diagnosis not present

## 2024-05-26 MED ORDER — OXYCODONE-ACETAMINOPHEN 5-325 MG PO TABS: 1.0000 | ORAL_TABLET | Freq: Once | ORAL | Status: DC | PRN

## 2024-05-26 MED ORDER — OXYCODONE-ACETAMINOPHEN 5-325 MG PO TABS: 1.0000 | ORAL_TABLET | Freq: Four times a day (QID) | ORAL | 0 refills | Status: DC | PRN

## 2024-05-26 MED ORDER — CALCIUM CARBONATE ANTACID 500 MG PO CHEW
1.0000 | CHEWABLE_TABLET | Freq: Once | ORAL | Status: AC
  Administered 2024-05-26: 200 mg via ORAL
  Filled 2024-05-26: qty 1

## 2024-05-26 NOTE — ED Triage Notes (Signed)
 Pt presents to ED from [facility] via EMS. Pt reports GLF at 1800 this evening. Pt states he was trimming bushed when he slipped and fell flat on back in grass. Denies hitting head and LOC. Pt reports taking baby ASA daily. Pt had x-ray at [facility] that showed possible fx of L3. Pt reports hx of spinal stenosis. EMS states no meds given, pt reports getting IM shot at [facility] but does not know what it was.   170/100 HR 74 98% RA Pain 4/10

## 2024-05-26 NOTE — Discharge Instructions (Addendum)
 Take the medications as needed for pain and discomfort.  Wear the brace for comfort.  Follow-up with your spine doctor for further evaluation.

## 2024-05-26 NOTE — ED Provider Notes (Signed)
 [facility] EMERGENCY DEPARTMENT AT [facility] Provider Note   CSN: [id number] Arrival date & time: 05/26/24  2156     Patient presents with: Fall and Possible Fracture    Carlos David is a 78 y.o. male.    Fall   Patient has history of hypothyroidism hyperlipidemia, thyroid  cancer, hypertension, thoracic aortic aneurysm, spinal stenosis.  Patient has been seeing an orthopedic spine specialist.  He has required multiple epidural injections.  They have been considering the possibility of surgical intervention.  Patient presents to the ED for evaluation after fall.  Patient was trimming bushes when he slipped and fell flat on his back.  He went to an urgent care today and they did x-rays showing a possible fracture of L3.  Patient was given an injection at the urgent care.  He is not sure what medication is but his pain is currently controlled.  Patient feels like the pain is more on the sides.  He denies any new numbness or weakness.  No other injuries.    Prior to Admission medications   Medication Sig Start Date End Date Taking? Authorizing Provider  oxyCODONE -acetaminophen  (PERCOCET/ROXICET) 5-325 MG tablet Take 1 tablet by mouth every 6 (six) hours as needed for severe pain (pain score 7-10). 05/26/24  Yes Randol Simmonds, MD  aspirin  EC 81 MG tablet Take 1 tablet (81 mg total) by mouth daily. Swallow whole. 07/30/21   Kate Lonni LITTIE, MD  atorvastatin  (LIPITOR) 40 MG tablet TAKE 1 TABLET BY MOUTH EVERY DAY 04/05/24   Kate Lonni LITTIE, MD  colchicine  0.6 MG tablet Take 1 tablet (0.6 mg total) by mouth daily. 01/12/24   Harden Jerona GAILS, MD  cyclobenzaprine (FLEXERIL) 10 MG tablet Take 10 mg by mouth 3 (three) times daily as needed for muscle spasms.    [provider]  ezetimibe  (ZETIA ) 10 MG tablet TAKE 1 TABLET BY MOUTH EVERY DAY 09/22/23   Kate Lonni LITTIE, MD  finasteride  (PROSCAR ) 5 MG tablet TAKE 1 TABLET BY MOUTH EVERY DAY 10/15/16   Jame Maude FALCON, MD  gabapentin  (NEURONTIN ) 300 MG capsule Take 1 capsule by mouth 2 (two) times daily. 05/21/21   [provider]  hydrocortisone 2.5 % cream  06/05/20   [provider]  levothyroxine  (SYNTHROID ) 137 MCG tablet TAKE 1 TABLET BY MOUTH EVERY DAY 11/16/23   Kennyth Worth HERO, MD  losartan  (COZAAR ) 100 MG tablet TAKE 1 TABLET BY MOUTH EVERY DAY 03/10/24   Kate Lonni LITTIE, MD  meloxicam  (MOBIC ) 15 MG tablet Take 1 tablet (15 mg total) by mouth as needed. 07/30/21   Kate Lonni LITTIE, MD  TOLAK 4 % CREA Apply 1 Application topically daily. Patient not taking: Reported on 12/11/2023 10/29/23   [provider]    Allergies: Penicillins    Review of Systems  Updated Vital Signs BP (!) 141/68   Pulse 66   Temp 98 F (36.7 C) (Oral)   Resp 18   Ht 1.778 m (5' 10)   Wt 90.7 kg   SpO2 96%   BMI 28.70 kg/m   Physical Exam Vitals and nursing note reviewed.  Constitutional:      General: He is not in acute distress.    Appearance: He is well-developed.  HENT:     Head: Normocephalic and atraumatic.     Right Ear: External ear normal.     Left Ear: External ear normal.  Eyes:     General: No scleral icterus.  Right eye: No discharge.        Left eye: No discharge.     Conjunctiva/sclera: Conjunctivae normal.  Neck:     Trachea: No tracheal deviation.  Cardiovascular:     Rate and Rhythm: Normal rate.  Pulmonary:     Effort: Pulmonary effort is normal. No respiratory distress.     Breath sounds: No stridor.  Abdominal:     General: There is no distension.  Musculoskeletal:        General: No swelling or deformity.     Cervical back: Neck supple.     Thoracic back: Tenderness present.     Lumbar back: Tenderness present.     Comments: Ttp primarily paraspinal region lower lumbar thoracic upper lumbar  Skin:    General: Skin is warm and dry.     Findings: No rash.  Neurological:     Mental Status: He is alert. Mental status is at  baseline.     Cranial Nerves: No dysarthria or facial asymmetry.     Motor: No seizure activity.     (all labs ordered are listed, but only abnormal results are displayed) Labs Reviewed - No data to display  EKG: None  Radiology: CT Thoracic Spine Wo Contrast Result Date: 05/26/2024 CLINICAL DATA:  [name].  Back pain. EXAM: CT THORACIC AND LUMBAR SPINE WITHOUT CONTRAST TECHNIQUE: Multidetector CT imaging of the thoracic and lumbar spine was performed without contrast. Multiplanar CT image reconstructions were also generated. RADIATION DOSE REDUCTION: This exam was performed according to the departmental dose-optimization program which includes automated exposure control, adjustment of the mA and/or kV according to patient size and/or use of iterative reconstruction technique. COMPARISON:  Lumbar MRI 04/21/2023 FINDINGS: CT THORACIC SPINE FINDINGS Alignment: Mildly exaggerated thoracic kyphosis but normal alignment of the thoracic vertebral bodies. Vertebrae: No acute thoracic spine fracture is identified. The visualized posterior ribs are intact. Paraspinal and other soft tissues: No significant paraspinal findings. Age related aortic and coronary artery calcifications are noted. The visualized posterior lungs demonstrate scarring changes but no obvious mass or pleural effusion. Right lower lobe calcified granuloma noted. Disc levels: No acute large thoracic disc protrusions are identified. Remote partially calcified right para tendrils disc protrusion with nuclear trail sign noted at T7-8. Small calcified left paracentral disc protrusion with nuclear trail sign at T8-9. CT LUMBAR SPINE FINDINGS Segmentation: There are five lumbar type vertebral bodies. The last full intervertebral disc space is labeled L5-S1. Alignment: Mild degenerative anterolisthesis of L3 is a stable finding and due to facet disease. Vertebrae: Compression fracture of the super endplate of L3 is a new finding when compared to the  prior MRI from 1 year ago. No retropulsion. Maximum depression is slightly greater than 50%. The other lumbar vertebral bodies are intact. There is significant lower lumbar discogenic sclerosis and advanced lumbar facet disease. The spinous processes are also moderately enlarged and contacting each other. Paraspinal and other soft tissues: A suspect paraspinal hematoma at L3. No significant retroperitoneal process. Age related aortic calcifications but no aneurysm. Disc levels: Severe degenerative lumbar spondylosis from L2-3 down through L5-S1. Advanced degenerative disc disease and facet disease and endplate sclerosis. Vacuum disc phenomenon noted. Remote surgical changes noted L4-5 with a left-sided laminectomy. Severe multifactorial spinal and bilateral lateral recess stenosis at L2-3, L3-4 and L4-5. There is also significant right foraminal stenosis at L4-5. IMPRESSION: 1. Compression fracture of the super endplate of L3 is a new finding when compared to the prior MRI from 1 year ago.  No retropulsion. Maximum depression is slightly greater than 50%. 2. No thoracic spine fractures are identified. 3. Severe degenerative lumbar spondylosis from L2-3 down through L5-S1. 4. Severe multifactorial spinal and bilateral lateral recess stenosis at L2-3, L3-4 and L4-5. 5. Remote surgical changes at L4-5 with a left-sided laminectomy. 6. No acute thoracic spine fracture. 7. Aortic atherosclerosis. Aortic Atherosclerosis (ICD10-I70.0). Electronically Signed   By: MYRTIS Stammer M.D.   On: 05/26/2024 23:23   CT Lumbar Spine Wo Contrast Result Date: 05/26/2024 CLINICAL DATA:  [name].  Back pain. EXAM: CT THORACIC AND LUMBAR SPINE WITHOUT CONTRAST TECHNIQUE: Multidetector CT imaging of the thoracic and lumbar spine was performed without contrast. Multiplanar CT image reconstructions were also generated. RADIATION DOSE REDUCTION: This exam was performed according to the departmental dose-optimization program which includes  automated exposure control, adjustment of the mA and/or kV according to patient size and/or use of iterative reconstruction technique. COMPARISON:  Lumbar MRI 04/21/2023 FINDINGS: CT THORACIC SPINE FINDINGS Alignment: Mildly exaggerated thoracic kyphosis but normal alignment of the thoracic vertebral bodies. Vertebrae: No acute thoracic spine fracture is identified. The visualized posterior ribs are intact. Paraspinal and other soft tissues: No significant paraspinal findings. Age related aortic and coronary artery calcifications are noted. The visualized posterior lungs demonstrate scarring changes but no obvious mass or pleural effusion. Right lower lobe calcified granuloma noted. Disc levels: No acute large thoracic disc protrusions are identified. Remote partially calcified right para tendrils disc protrusion with nuclear trail sign noted at T7-8. Small calcified left paracentral disc protrusion with nuclear trail sign at T8-9. CT LUMBAR SPINE FINDINGS Segmentation: There are five lumbar type vertebral bodies. The last full intervertebral disc space is labeled L5-S1. Alignment: Mild degenerative anterolisthesis of L3 is a stable finding and due to facet disease. Vertebrae: Compression fracture of the super endplate of L3 is a new finding when compared to the prior MRI from 1 year ago. No retropulsion. Maximum depression is slightly greater than 50%. The other lumbar vertebral bodies are intact. There is significant lower lumbar discogenic sclerosis and advanced lumbar facet disease. The spinous processes are also moderately enlarged and contacting each other. Paraspinal and other soft tissues: A suspect paraspinal hematoma at L3. No significant retroperitoneal process. Age related aortic calcifications but no aneurysm. Disc levels: Severe degenerative lumbar spondylosis from L2-3 down through L5-S1. Advanced degenerative disc disease and facet disease and endplate sclerosis. Vacuum disc phenomenon noted. Remote  surgical changes noted L4-5 with a left-sided laminectomy. Severe multifactorial spinal and bilateral lateral recess stenosis at L2-3, L3-4 and L4-5. There is also significant right foraminal stenosis at L4-5. IMPRESSION: 1. Compression fracture of the super endplate of L3 is a new finding when compared to the prior MRI from 1 year ago. No retropulsion. Maximum depression is slightly greater than 50%. 2. No thoracic spine fractures are identified. 3. Severe degenerative lumbar spondylosis from L2-3 down through L5-S1. 4. Severe multifactorial spinal and bilateral lateral recess stenosis at L2-3, L3-4 and L4-5. 5. Remote surgical changes at L4-5 with a left-sided laminectomy. 6. No acute thoracic spine fracture. 7. Aortic atherosclerosis. Aortic Atherosclerosis (ICD10-I70.0). Electronically Signed   By: MYRTIS Stammer M.D.   On: 05/26/2024 23:23     Procedures   Medications Ordered in the ED  oxyCODONE -acetaminophen  (PERCOCET/ROXICET) 5-325 MG per tablet 1 tablet (has no administration in time range)  calcium  carbonate (TUMS - dosed in mg elemental calcium ) chewable tablet 200 mg of elemental calcium  (200 mg of elemental calcium  Oral Given 05/26/24 2320)  Clinical Course as of 05/26/24 2357  Thu May 26, 2024  2338 CT scan shows compression fracture of the superior endplate of L3 new compared to MRI from 1 year ago. [JK]    Clinical Course User Index [JK] Randol Simmonds, MD                                 Medical Decision Making Problems Addressed: Closed compression fracture of L3 lumbar vertebra, initial encounter [facility]): acute illness or injury that poses a threat to life or bodily functions Spinal stenosis, unspecified spinal region: acute illness or injury that poses a threat to life or bodily functions  Amount and/or Complexity of Data Reviewed Radiology: ordered and independent interpretation performed.  Risk OTC drugs. Prescription drug management.   Patient presented to the ED for  evaluation of back pain after a fall.  Patient has history of spinal stenosis.  Patient had outpatient films suggest the possibility of L3 compression fracture.  CT scans performed today which do confirm L3 compression fracture.  Patient has not required any pain medications in the ED.  Will plan on discharge home with TLSO brace prescription for pain medications and outpatient follow-up with his spine doctor.     Final diagnoses:  Closed compression fracture of L3 lumbar vertebra, initial encounter (HCC)  Spinal stenosis, unspecified spinal region    ED Discharge Orders          Ordered    oxyCODONE -acetaminophen  (PERCOCET/ROXICET) 5-325 MG tablet  Every 6 hours PRN        05/26/24 2355               Randol Simmonds, MD 05/26/24 2357

## 2024-05-27 NOTE — Progress Notes (Signed)
 Orthopedic Tech Progress Note Patient Details:  Carlos David 29-Dec-1945 [id number]  Patient ID: [name], male   DOB: 11/30/1945, 78 y.o.   MRN: [id number]  Waiting on STAT order through [facility] 05/27/2024, 12:41 AM

## 2024-05-31 DIAGNOSIS — M48062 Spinal stenosis, lumbar region with neurogenic claudication: Secondary | ICD-10-CM | POA: Diagnosis not present

## 2024-05-31 DIAGNOSIS — M8008XA Age-related osteoporosis with current pathological fracture, vertebra(e), initial encounter for fracture: Secondary | ICD-10-CM | POA: Diagnosis not present

## 2024-05-31 DIAGNOSIS — M549 Dorsalgia, unspecified: Secondary | ICD-10-CM | POA: Diagnosis not present

## 2024-05-31 DIAGNOSIS — M5416 Radiculopathy, lumbar region: Secondary | ICD-10-CM | POA: Diagnosis not present

## 2024-06-07 ENCOUNTER — Other Ambulatory Visit: Payer: Self-pay | Admitting: Cardiology

## 2024-06-07 ENCOUNTER — Other Ambulatory Visit: Payer: Self-pay | Admitting: Family Medicine

## 2024-06-09 DIAGNOSIS — M47816 Spondylosis without myelopathy or radiculopathy, lumbar region: Secondary | ICD-10-CM | POA: Diagnosis not present

## 2024-06-09 DIAGNOSIS — Z6829 Body mass index (BMI) 29.0-29.9, adult: Secondary | ICD-10-CM | POA: Diagnosis not present

## 2024-06-09 DIAGNOSIS — M7672 Peroneal tendinitis, left leg: Secondary | ICD-10-CM | POA: Diagnosis not present

## 2024-06-09 DIAGNOSIS — M4850XA Collapsed vertebra, not elsewhere classified, site unspecified, initial encounter for fracture: Secondary | ICD-10-CM | POA: Diagnosis not present

## 2024-06-15 DIAGNOSIS — M7672 Peroneal tendinitis, left leg: Secondary | ICD-10-CM | POA: Diagnosis not present

## 2024-06-17 DIAGNOSIS — M7672 Peroneal tendinitis, left leg: Secondary | ICD-10-CM | POA: Diagnosis not present

## 2024-06-23 DIAGNOSIS — M7672 Peroneal tendinitis, left leg: Secondary | ICD-10-CM | POA: Diagnosis not present

## 2024-06-27 ENCOUNTER — Ambulatory Visit: Admitting: Orthopedic Surgery

## 2024-06-27 ENCOUNTER — Other Ambulatory Visit (INDEPENDENT_AMBULATORY_CARE_PROVIDER_SITE_OTHER): Payer: Self-pay

## 2024-06-27 DIAGNOSIS — M170 Bilateral primary osteoarthritis of knee: Secondary | ICD-10-CM

## 2024-06-27 DIAGNOSIS — M25561 Pain in right knee: Secondary | ICD-10-CM

## 2024-06-27 DIAGNOSIS — M25562 Pain in left knee: Secondary | ICD-10-CM

## 2024-06-27 DIAGNOSIS — M545 Low back pain, unspecified: Secondary | ICD-10-CM | POA: Diagnosis not present

## 2024-06-27 NOTE — Progress Notes (Signed)
 Office Visit Note   Patient: Carlos David           Date of Birth: October 22, 1946           MRN: [id number] Visit Date: 06/27/2024 Requested by: Kennyth Worth HERO, MD [address] [place],  [place] [address] PCP: Kennyth Worth HERO, MD  Subjective: Chief Complaint  Patient presents with   Right Knee - Pain    HPI: Carlos David is a 78 y.o. male who presents to the office reporting bilateral knee pain right worse than left.  Patient reports some clicking but the pain does not wake him from sleep at night.  He also has some back issues.  He is getting ESI next week.  Denies any numbness and tingling and does not really report any radicular symptoms below the knees.  Had bilateral knee injections 12/07/2023 which helped him for 3 months.  Does have some achiness in the knees in the morning.  Did have a fall 05/26/2024.  Has a known back compression fracture for which she is seeing Dr. Lanis..                ROS: All systems reviewed are negative as they relate to the chief complaint within the history of present illness.  Patient denies fevers or chills.  Assessment & Plan: Visit Diagnoses:  1. Pain in both knees, unspecified chronicity     Plan: Impression is bilateral knee pain with no real effusion in either knee.  Has a history of meniscal cyst which was aspirated with good results.  I think the real issue with time he is most of his pain is coming from his knees how much is coming from his back.  I think we could inject the knees today and see how he is doing in a week just before he gets his injections in the back.  I do not think any of this is really reaching the threshold for intervention.  He will let us  know in a week how he is doing and then we can proceed with further intervention from there.  Follow-Up Instructions: No follow-ups on file.   Orders:  Orders Placed This Encounter  Procedures   XR KNEE 3 VIEW RIGHT   XR Knee 1-2 Views Left   No orders of the defined types were  placed in this encounter.     Procedures: Large Joint Inj: bilateral knee on 06/27/2024 7:04 PM Indications: diagnostic evaluation, joint swelling and pain Details: 18 G 1.5 in needle, superolateral approach  Arthrogram: No  Medications (Right): 5 mL lidocaine  1 %; 4 mL bupivacaine  0.25 %; 40 mg triamcinolone  acetonide 40 MG/ML Medications (Left): 5 mL lidocaine  1 %; 4 mL bupivacaine  0.25 %; 40 mg triamcinolone  acetonide 40 MG/ML Outcome: tolerated well, no immediate complications Procedure, treatment alternatives, risks and benefits explained, specific risks discussed. Consent was given by the patient. Immediately prior to procedure a time out was called to verify the correct patient, procedure, equipment, support staff and site/side marked as required. Patient was prepped and draped in the usual sterile fashion.       Clinical Data: No additional findings.  Objective: Vital Signs: There were no vitals taken for this visit.  Physical Exam:  Constitutional: Patient appears well-developed HEENT:  Head: Normocephalic Eyes:EOM are normal Neck: Normal range of motion Cardiovascular: Normal rate Pulmonary/chest: Effort normal Neurologic: Patient is alert Skin: Skin is warm Psychiatric: Patient has normal mood and affect  Ortho Exam: Ortho exam demonstrates  good range of motion of both knees from near full extension to about 110 of flexion.  No effusion in either knee.  Mild medial joint line tenderness no cystic structures present around either knee joint line.  No groin pain with internal/external rotation of the leg.  Has 5 out of 5 dorsiflexion plantarflexion quad and hamstring strength with no nerve root tension signs and palpable pedal pulses.  Mild patellar femoral crepitus is present bilaterally.  Specialty Comments:  No specialty comments available.  Imaging: No results found.   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 12/17/2018   Erectile dysfunction 12/17/2018   Lumbar radiculopathy 10/14/2016   BPH s/p TURP 2020 03/02/2014   Osteoarthritis 07/16/2012   Hypothyroidism s/p thyroidectomy 1990 due to thyroid  cancer 12/26/2008   Dyslipidemia 12/26/2008   Past Medical History:  Diagnosis Date   Actinic keratosis 12/16/2010   Qualifier: Diagnosis of  By: Jame  MD, Maude FALCON    Arthritis    BPH (benign prostatic hyperplasia)    Cancer (HCC)    thyroid  cancer 1990   Cataract    both eyes   History of radiation therapy 1990   HYPERLIPIDEMIA 12/26/2008   Hypertension    HYPOTHYROIDISM 12/26/2008   PPD positive    Thoracic aortic aneurysm (HCC)    THYROID  CANCER, HX OF 1990   THYROID  CANCER, HX OF 12/26/2008   Qualifier: Diagnosis of  By: Jame  MD, Maude FALCON     Family History  Problem Relation Age of Onset   Colon cancer Neg Hx    Rectal cancer Neg Hx    Stomach cancer Neg Hx    Esophageal cancer Neg Hx     Past Surgical History:  Procedure Laterality Date   CATARACT EXTRACTION, BILATERAL  2020   COLONOSCOPY     10-ye ago-normal at [facility]   EYE SURGERY     as child   HAND SURGERY     right hand trigger figger    INGUINAL HERNIA REPAIR Right 02/25/2022   Procedure: OPEN RIGHT INGUINAL HERNIA REPAIR WITH MESH;  Surgeon: Kinsinger, Herlene Righter, MD;  Location: [facility];  Service: General;  Laterality: Right;   neckotomy  1990   20 lymph nodes removed from left neck   TOTAL THYROIDECTOMY  1990   TRANSURETHRAL RESECTION OF PROSTATE N/A 01/21/2019   Procedure: TRANSURETHRAL RESECTION OF THE PROSTATE (TURP);  Surgeon: Alvaro Hummer, MD;  Location: [facility];  Service: Urology;  Laterality: N/A;  58 MINS   Social History   Occupational History   Not on file  Tobacco Use   Smoking status:  Former    Current packs/day: 0.00    Types: Cigarettes, Pipe    Quit date: 11/17/1968    Years since quitting: 55.6   Smokeless tobacco: Never  Vaping Use   Vaping status: Never Used  Substance and Sexual Activity   Alcohol use: Yes    Alcohol/week: 0.0 standard drinks of alcohol    Comment: occas. beer   Drug use: No   Sexual activity: Not on file

## 2024-06-28 ENCOUNTER — Encounter: Payer: Self-pay | Admitting: Orthopedic Surgery

## 2024-06-28 DIAGNOSIS — M7672 Peroneal tendinitis, left leg: Secondary | ICD-10-CM | POA: Diagnosis not present

## 2024-06-28 MED ORDER — TRIAMCINOLONE ACETONIDE 40 MG/ML IJ SUSP
40.0000 mg | INTRAMUSCULAR | Status: AC | PRN
  Administered 2024-06-27 (×2): 40 mg via INTRA_ARTICULAR

## 2024-06-28 MED ORDER — LIDOCAINE HCL 1 % IJ SOLN
5.0000 mL | INTRAMUSCULAR | Status: AC | PRN
  Administered 2024-06-27 (×2): 5 mL

## 2024-06-28 MED ORDER — LIDOCAINE HCL 1 % IJ SOLN
5.0000 mL | INTRAMUSCULAR | Status: AC | PRN
Start: 2024-06-27 — End: 2024-06-27
  Administered 2024-06-27 (×2): 5 mL

## 2024-06-28 MED ORDER — BUPIVACAINE HCL 0.25 % IJ SOLN
4.0000 mL | INTRAMUSCULAR | Status: AC | PRN
  Administered 2024-06-27 (×2): 4 mL via INTRA_ARTICULAR

## 2024-06-29 ENCOUNTER — Ambulatory Visit

## 2024-06-29 VITALS — Ht 70.0 in | Wt 200.0 lb

## 2024-06-29 DIAGNOSIS — Z0000 Encounter for general adult medical examination without abnormal findings: Secondary | ICD-10-CM | POA: Diagnosis not present

## 2024-06-29 DIAGNOSIS — C44612 Basal cell carcinoma of skin of right upper limb, including shoulder: Secondary | ICD-10-CM | POA: Diagnosis not present

## 2024-06-29 DIAGNOSIS — L905 Scar conditions and fibrosis of skin: Secondary | ICD-10-CM | POA: Diagnosis not present

## 2024-06-29 NOTE — Progress Notes (Signed)
 Subjective:   Carlos David is a 78 y.o. who presents for a Medicare Wellness preventive visit.  As a reminder, Annual Wellness Visits don't include a physical exam, and some assessments may be limited, especially if this visit is performed virtually. We may recommend an in-person follow-up visit with your provider if needed.  Visit Complete: Virtual I connected with  Carlos David on 06/29/24 by a audio enabled telemedicine application and verified that I am speaking with the correct person using two identifiers.  Patient Location: Home  Provider Location: Home Office  I discussed the limitations of evaluation and management by telemedicine. The patient expressed understanding and agreed to proceed.  Vital Signs: Because this visit was a virtual/telehealth visit, some criteria may be missing or patient reported. Any vitals not documented were not able to be obtained and vitals that have been documented are patient reported.  VideoDeclined- This patient declined Librarian, academic. Therefore the visit was completed with audio only.  Persons Participating in Visit: Patient.  AWV Questionnaire: No: Patient Medicare AWV questionnaire was not completed prior to this visit.  Cardiac Risk Factors include: advanced age (>12men, >47 women);hypertension;dyslipidemia;male gender     Objective:    Today's Vitals   06/29/24 1311  Weight: 200 lb (90.7 kg)  Height: 5' 10 (1.778 m)   Body mass index is 28.7 kg/m.     06/29/2024    1:16 PM 05/26/2024   10:12 PM 03/03/2023    3:40 PM 02/28/2022    1:11 PM 02/25/2022   10:21 AM 02/22/2021   11:59 AM 07/06/2019    9:17 AM  Advanced Directives  Does Patient Have a Medical Advance Directive? Yes Yes Yes Yes Yes Yes Yes  Type of Estate agent of [place];Living will  Healthcare Power of [place];Living will Living will Healthcare Power of [place];Living will Living will Healthcare Power of  [place];Living will  Does patient want to make changes to medical advance directive?  No - Patient declined     No - Patient declined   Copy of Healthcare Power of Attorney in Chart? No - copy requested  No - copy requested No - copy requested No - copy requested  No - copy requested      Data saved with a previous flowsheet row definition    Current Medications (verified) Outpatient Encounter Medications as of 06/29/2024  Medication Sig   aspirin  EC 81 MG tablet Take 1 tablet (81 mg total) by mouth daily. Swallow whole.   atorvastatin  (LIPITOR) 40 MG tablet TAKE 1 TABLET BY MOUTH EVERY DAY   BOOSTRIX 5-2.5-18.5 LF-MCG/0.5 injection    cyclobenzaprine (FLEXERIL) 10 MG tablet Take 10 mg by mouth 3 (three) times daily as needed for muscle spasms.   ezetimibe  (ZETIA ) 10 MG tablet TAKE 1 TABLET BY MOUTH EVERY DAY   finasteride  (PROSCAR ) 5 MG tablet TAKE 1 TABLET BY MOUTH EVERY DAY   gabapentin  (NEURONTIN ) 300 MG capsule Take 1 capsule by mouth 2 (two) times daily.   hydrocortisone 2.5 % cream    levothyroxine  (SYNTHROID ) 137 MCG tablet TAKE 1 TABLET BY MOUTH EVERY DAY   losartan  (COZAAR ) 100 MG tablet TAKE 1 TABLET BY MOUTH EVERY DAY   meloxicam  (MOBIC ) 15 MG tablet Take 1 tablet (15 mg total) by mouth as needed.   TOLAK 4 % CREA Apply 1 Application topically daily. (Patient not taking: Reported on 06/29/2024)   [DISCONTINUED] colchicine  0.6 MG tablet Take 1 tablet (0.6 mg total) by  mouth daily.   [DISCONTINUED] oxyCODONE -acetaminophen  (PERCOCET/ROXICET) 5-325 MG tablet Take 1 tablet by mouth every 6 (six) hours as needed for severe pain (pain score 7-10).   Facility-Administered Encounter Medications as of 06/29/2024  Medication   0.9 %  sodium chloride  infusion    Allergies (verified) Penicillins   History: Past Medical History:  Diagnosis Date   Actinic keratosis 12/16/2010   Qualifier: Diagnosis of  By: Jame  MD, Maude FALCON    Arthritis    BPH (benign prostatic hyperplasia)     Cancer (HCC)    thyroid  cancer 1990   Cataract    both eyes   History of radiation therapy 1990   HYPERLIPIDEMIA 12/26/2008   Hypertension    HYPOTHYROIDISM 12/26/2008   PPD positive    Thoracic aortic aneurysm (HCC)    THYROID  CANCER, HX OF 1990   THYROID  CANCER, HX OF 12/26/2008   Qualifier: Diagnosis of  By: Jame  MD, Maude FALCON    Past Surgical History:  Procedure Laterality Date   CATARACT EXTRACTION, BILATERAL  2020   COLONOSCOPY     10-ye ago-normal at [facility]   EYE SURGERY     as child   HAND SURGERY     right hand trigger figger    INGUINAL HERNIA REPAIR Right 02/25/2022   Procedure: OPEN RIGHT INGUINAL HERNIA REPAIR WITH MESH;  Surgeon: Kinsinger, Herlene Righter, MD;  Location: [facility] [place];  Service: General;  Laterality: Right;   neckotomy  1990   20 lymph nodes removed from left neck   TOTAL THYROIDECTOMY  1990   TRANSURETHRAL RESECTION OF PROSTATE N/A 01/21/2019   Procedure: TRANSURETHRAL RESECTION OF THE PROSTATE (TURP);  Surgeon: Alvaro Hummer, MD;  Location: [facility];  Service: Urology;  Laterality: N/A;  73 MINS   Family History  Problem Relation Age of Onset   Colon cancer Neg Hx    Rectal cancer Neg Hx    Stomach cancer Neg Hx    Esophageal cancer Neg Hx    Social History   Socioeconomic History   Marital status: Married    Spouse name: Not on file   Number of children: Not on file   Years of education: Not on file   Highest education level: Not on file  Occupational History   Not on file  Tobacco Use   Smoking status: Former    Current packs/day: 0.00    Types: Cigarettes, Pipe    Quit date: 11/17/1968    Years since quitting: 55.6   Smokeless tobacco: Never  Vaping Use   Vaping status: Never Used  Substance and Sexual Activity   Alcohol use: Not Currently    Comment: occas. beer   Drug use: No   Sexual activity: Not on file  Other Topics Concern   Not on file  Social History Narrative   Not on file    Social Drivers of Health   Financial Resource Strain: Low Risk  (06/29/2024)   Overall Financial Resource Strain (CARDIA)    Difficulty of Paying Living Expenses: Not hard at all  Food Insecurity: No Food Insecurity (06/29/2024)   Hunger Vital Sign    Worried About Running Out of Food in the Last Year: Never true    Ran Out of Food in the Last Year: Never true  Transportation Needs: No Transportation Needs (06/29/2024)   PRAPARE - Administrator, Civil Service (Medical): No    Lack of Transportation (Non-Medical): No  Physical Activity: Inactive (  06/29/2024)   Exercise Vital Sign    Days of Exercise per Week: 0 days    Minutes of Exercise per Session: 0 min  Stress: No Stress Concern Present (06/29/2024)   [employer] of Occupational Health - Occupational Stress Questionnaire    Feeling of Stress: Not at all  Social Connections: Moderately Integrated (06/29/2024)   Social Connection and Isolation Panel    Frequency of Communication with Friends and Family: Twice a week    Frequency of Social Gatherings with Friends and Family: More than three times a week    Attends Religious Services: More than 4 times per year    Active Member of [employer] or Organizations: No    Attends Engineer, structural: Never    Marital Status: Married    Tobacco Counseling Counseling given: Not Answered    Clinical Intake:  Pre-visit preparation completed: Yes  Pain : No/denies pain     BMI - recorded: 28.7 Nutritional Status: BMI 25 -29 Overweight Diabetes: No  Lab Results  Component Value Date   HGBA1C 6.2 07/24/2023   HGBA1C 6.2 07/16/2022   HGBA1C 6.1 07/10/2021     How often do you need to have someone help you when you read instructions, pamphlets, or other written materials from your doctor or pharmacy?: 1 - Never  Interpreter Needed?: No  Information entered by :: Ellouise Haws, LPN   Activities of Daily Living     06/29/2024    1:12 PM  In your  present state of health, do you have any difficulty performing the following activities:  Hearing? 0  Vision? 0  Difficulty concentrating or making decisions? 0  Walking or climbing stairs? 1  Comment with back issues  Dressing or bathing? 0  Doing errands, shopping? 0  Preparing Food and eating ? N  Using the Toilet? N  In the past six months, have you accidently leaked urine? N  Do you have problems with loss of bowel control? N  Managing your Medications? N  Managing your Finances? N  Housekeeping or managing your Housekeeping? N    Patient Care Team: Kennyth Worth HERO, MD as PCP - General (Family Medicine) Kate Lonni CROME, MD as PCP - Cardiology (Cardiology) Alvaro Ricardo KATHEE Raddle., MD as Consulting Physician (Urology) Letha Cancer, MD as Consulting Physician (Physical Medicine and Rehabilitation) Roz Anes, MD as Consulting Physician (Ophthalmology) Lanis Pupa, MD as Consulting Physician (Neurosurgery) Nicholaus Sherlean CROME, [facility] (Inactive) as Pharmacist (Pharmacist)  I have updated your Care Teams any recent Medical Services you may have received from other providers in the past year.     Assessment:   This is a routine wellness examination for [place].  Hearing/Vision screen Hearing Screening - Comments:: Pt denies any hearing issues  Vision Screening - Comments:: Wears rx glasses - up to date with routine eye exams with [facility]    Goals Addressed             This Visit's Progress    Patient Stated       Weight loss       Depression Screen     06/29/2024    1:17 PM 07/24/2023    8:00 AM 03/03/2023    3:39 PM 08/08/2022    9:05 AM 07/16/2022    7:59 AM 02/28/2022    1:09 PM 07/10/2021    9:33 AM  PHQ 2/9 Scores  PHQ - 2 Score 0 0 0 0 0 0 0    Fall Risk  06/29/2024    1:19 PM 07/24/2023    8:00 AM 03/03/2023    3:41 PM 08/08/2022    9:05 AM 07/16/2022    7:58 AM  Fall Risk   Falls in the past year? 1 0 0 0 0  Number falls in past yr: 1  0 0 0 0  Injury with Fall? 1 0 0 0 0  Comment back      Risk for fall due to : History of fall(s);Impaired balance/gait;Impaired mobility No Fall Risks Impaired balance/gait;Impaired vision No Fall Risks No Fall Risks  Follow up Falls prevention discussed  Falls prevention discussed  Falls evaluation completed      Data saved with a previous flowsheet row definition    MEDICARE RISK AT HOME:  Medicare Risk at Home Any stairs in or around the home?: No If so, are there any without handrails?: No Home free of loose throw rugs in walkways, pet beds, electrical cords, etc?: Yes Adequate lighting in your home to reduce risk of falls?: Yes Life alert?: No Use of a cane, walker or w/c?: Yes Grab bars in the bathroom?: Yes Shower chair or bench in shower?: No Elevated toilet seat or a handicapped toilet?: No  TIMED UP AND GO:  Was the test performed?  No  Cognitive Function: 6CIT completed        06/29/2024    1:20 PM 03/03/2023    3:42 PM 02/28/2022    1:13 PM 02/22/2021   12:03 PM  6CIT Screen  What Year? 0 points 0 points 0 points 0 points  What month? 0 points 0 points 0 points 0 points  What time? 0 points 0 points 0 points   Count back from 20 0 points 0 points 0 points 0 points  Months in reverse 0 points 0 points 0 points 0 points  Repeat phrase 0 points 0 points 0 points 0 points  Total Score 0 points 0 points 0 points     Immunizations Immunization History  Administered Date(s) Administered   Hep A / Hep B 05/24/2012   Hepatitis B 06/24/2012, 12/27/2012   Influenza,inj,Quad PF,6+ Mos 11/24/2013   PFIZER(Purple Top)SARS-COV-2 Vaccination 12/25/2019, 01/19/2020, 09/30/2020   Pneumococcal Conjugate-13 03/09/2014   Pneumococcal Polysaccharide-23 02/17/2012   Td 03/31/2023   Tdap 05/27/2024   Tetanus 09/20/2014    Screening Tests Health Maintenance  Topic Date Due   COVID-19 Vaccine (4 - 2024-25 season) 07/19/2023   Medicare Annual Wellness (AWV)  06/29/2025    Colonoscopy  12/10/2028   DTaP/Tdap/Td (3 - Td or Tdap) 05/27/2034   Pneumococcal Vaccine: 50+ Years  Completed   Hepatitis B Vaccines  Completed   Hepatitis C Screening  Completed   HPV VACCINES  Aged Out   Meningococcal B Vaccine  Aged Out   INFLUENZA VACCINE  Discontinued   Zoster Vaccines- Shingrix  Discontinued    Health Maintenance  Health Maintenance Due  Topic Date Due   COVID-19 Vaccine (4 - 2024-25 season) 07/19/2023   Health Maintenance Items Addressed: See Nurse Notes at the end of this note  Additional Screening:  Vision Screening: Recommended annual ophthalmology exams for early detection of glaucoma and other disorders of the eye. Would you like a referral to an eye doctor? No    Dental Screening: Recommended annual dental exams for proper oral hygiene  Community Resource Referral / Chronic Care Management: CRR required this visit?  No   CCM required this visit?  No   Plan:    I  have personally reviewed and noted the following in the patient's chart:   Medical and social history Use of alcohol, tobacco or illicit drugs  Current medications and supplements including opioid prescriptions. Patient is not currently taking opioid prescriptions. Functional ability and status Nutritional status Physical activity Advanced directives List of other physicians Hospitalizations, surgeries, and ER visits in previous 12 months Vitals Screenings to include cognitive, depression, and falls Referrals and appointments  In addition, I have reviewed and discussed with patient certain preventive protocols, quality metrics, and best practice recommendations. A written personalized care plan for preventive services as well as general preventive health recommendations were provided to patient.   Ellouise VEAR Haws, LPN   1/86/7974   After Visit Summary: (MyChart) Due to this being a telephonic visit, the after visit summary with patients personalized plan was offered to  patient via MyChart   Notes: Nothing significant to report at this time.

## 2024-06-29 NOTE — Patient Instructions (Signed)
 [name] , Thank you for taking time out of your busy schedule to complete your Annual Wellness Visit with me. I enjoyed our conversation and look forward to speaking with you again next year. I, as well as your care team,  appreciate your ongoing commitment to your health goals. Please review the following plan we discussed and let me know if I can assist you in the future. Your Game plan/ To Do List    Referrals: If you haven't heard from the office you've been referred to, please reach out to them at the phone provided.   Follow up Visits: We will see or speak with you next year for your Next Medicare AWV with our clinical staff Have you seen your provider in the last 6 months (3 months if uncontrolled diabetes)? No  Clinician Recommendations: Each day, aim for 6 glasses of water , plenty of protein in your diet and try to get up and walk/ stretch every hour for 5-10 minutes at a time.        This is a list of the screenings recommended for you:  Health Maintenance  Topic Date Due   COVID-19 Vaccine (4 - 2024-25 season) 07/19/2023   Medicare Annual Wellness Visit  03/02/2024   Colon Cancer Screening  12/10/2028   DTaP/Tdap/Td vaccine (2 - Tdap) 03/30/2033   Pneumococcal Vaccine for age over 68  Completed   Hepatitis B Vaccine  Completed   Hepatitis C Screening  Completed   HPV Vaccine  Aged Out   Meningitis B Vaccine  Aged Out   Flu Shot  Discontinued   Zoster (Shingles) Vaccine  Discontinued    Advanced directives: (Copy Requested) Please bring a copy of your health care power of attorney and living will to the office to be added to your chart at your convenience. You can mail to [facility] [address] [place], [place] [address] or email to [email or web address] Advance Care Planning is important because it:  [x]  Makes sure you receive the medical care that is consistent with your values, goals, and preferences  [x]  It provides guidance to your  family and loved ones and reduces their decisional burden about whether or not they are making the right decisions based on your wishes.  Follow the link provided in your after visit summary or read over the paperwork we have mailed to you to help you started getting your Advance Directives in place. If you need assistance in completing these, please reach out to us  so that we can help you!  See attachments for Preventive Care and Fall Prevention Tips.

## 2024-06-30 DIAGNOSIS — M7672 Peroneal tendinitis, left leg: Secondary | ICD-10-CM | POA: Diagnosis not present

## 2024-07-04 DIAGNOSIS — M7672 Peroneal tendinitis, left leg: Secondary | ICD-10-CM | POA: Diagnosis not present

## 2024-07-04 DIAGNOSIS — M5416 Radiculopathy, lumbar region: Secondary | ICD-10-CM | POA: Diagnosis not present

## 2024-07-06 DIAGNOSIS — M7672 Peroneal tendinitis, left leg: Secondary | ICD-10-CM | POA: Diagnosis not present

## 2024-07-07 DIAGNOSIS — M85851 Other specified disorders of bone density and structure, right thigh: Secondary | ICD-10-CM | POA: Diagnosis not present

## 2024-07-07 DIAGNOSIS — M85852 Other specified disorders of bone density and structure, left thigh: Secondary | ICD-10-CM | POA: Diagnosis not present

## 2024-07-12 ENCOUNTER — Ambulatory Visit (INDEPENDENT_AMBULATORY_CARE_PROVIDER_SITE_OTHER): Admitting: Family Medicine

## 2024-07-12 ENCOUNTER — Encounter: Payer: Self-pay | Admitting: Family Medicine

## 2024-07-12 VITALS — BP 134/86 | HR 72 | Temp 98.8°F | Ht 70.0 in | Wt 205.2 lb

## 2024-07-12 DIAGNOSIS — R0989 Other specified symptoms and signs involving the circulatory and respiratory systems: Secondary | ICD-10-CM | POA: Diagnosis not present

## 2024-07-12 DIAGNOSIS — J309 Allergic rhinitis, unspecified: Secondary | ICD-10-CM | POA: Diagnosis not present

## 2024-07-12 DIAGNOSIS — K219 Gastro-esophageal reflux disease without esophagitis: Secondary | ICD-10-CM | POA: Insufficient documentation

## 2024-07-12 DIAGNOSIS — I10 Essential (primary) hypertension: Secondary | ICD-10-CM

## 2024-07-12 DIAGNOSIS — J029 Acute pharyngitis, unspecified: Secondary | ICD-10-CM | POA: Diagnosis not present

## 2024-07-12 LAB — POCT RAPID STREP A (OFFICE): Rapid Strep A Screen: NEGATIVE

## 2024-07-12 MED ORDER — PANTOPRAZOLE SODIUM 40 MG PO TBEC: 40.0000 mg | DELAYED_RELEASE_TABLET | Freq: Every day | ORAL | 0 refills | Status: DC

## 2024-07-12 NOTE — Assessment & Plan Note (Signed)
 Flared up recently. Likely contributing to his above throat clearing. Will try 2 week course of protonix  and he will follow up in a couple of weeks.

## 2024-07-12 NOTE — Assessment & Plan Note (Signed)
 At goal today on losartan 100 mg daily.

## 2024-07-12 NOTE — Patient Instructions (Signed)
 It was very nice to see you today!  VISIT SUMMARY: You visited us  today due to a sore throat and hoarseness that you've been experiencing intermittently for a couple of weeks. We discussed your recent medication changes and the use of CBD gummies, and we also reviewed your history of thyroid  cancer and a recent compression fracture in your spine.  YOUR PLAN: CHRONIC SORE THROAT AND HOARSENESS: Your sore throat and hoarseness are likely due to laryngopharyngeal reflux, not a recurrence of thyroid  cancer. -Start taking Protonix  for two weeks to see if your symptoms improve. -We will refer you to an ENT specialist for further evaluation.  COMPRESSION FRACTURE OF THIRD LUMBAR VERTEBRA (L3): You have a compression fracture in your L3 vertebra, which was identified on an MRI and is being monitored by your neurosurgeon. -We are waiting for the results of your recent bone density test to assess your bone strength. -Continue to follow up with your neurosurgeon for management of the compression fracture.  CHRONIC BACK PAIN: You have chronic back pain that has not been relieved by epidural injections, but your current regimen of pregabalin and CBD gummies has improved your symptoms. -Continue taking pregabalin and CBD gummies for pain management. -Follow up with your neurosurgeon to discuss potential surgical options based on your bone density results.  Return if symptoms worsen or fail to improve.   Take care, Dr Kennyth  PLEASE NOTE:  If you had any lab tests, please let us  know if you have not heard back within a few days. You may see your results on mychart before we have a chance to review them but we will give you a call once they are reviewed by us .   If we ordered any referrals today, please let us  know if you have not heard from their office within the next week.   If you had any urgent prescriptions sent in today, please check with the pharmacy within an hour of our visit to make sure the  prescription was transmitted appropriately.   Please try these tips to maintain a healthy lifestyle:  Eat at least 3 REAL meals and 1-2 snacks per day.  Aim for no more than 5 hours between eating.  If you eat breakfast, please do so within one hour of getting up.   Each meal should contain half fruits/vegetables, one quarter protein, and one quarter carbs (no bigger than a computer mouse)  Cut down on sweet beverages. This includes juice, soda, and sweet tea.   Drink at least 1 glass of water  with each meal and aim for at least 8 glasses per day  Exercise at least 150 minutes every week.

## 2024-07-12 NOTE — Progress Notes (Signed)
 Carlos David is a 78 y.o. male who presents today for an office visit.  Assessment/Plan:  New/Acute Problems: Throat Clearing  Symptoms have been persistent for the last 2 weeks. Rapid strep negative. No signs or symptoms of post nasal drip but he is experiencing more reflux symptoms. Discussed with patient that his throat irritation may be due to the reflux. Will try 2 week course of protonix  40 mg daily. He did have something similar to this a few years ago that resolved spontaneously. He also does have a history of thyroid  cancer over 30 years ago. Will need ENT evaluation if symptoms do not improve over the next couple of weeks. He will follow up with me in a few weeks.   Chronic Problems Addressed Today: Allergic rhinitis Not currently having any symptoms.   Essential hypertension At goal today on losartan  100 mg daily  GERD (gastroesophageal reflux disease) Flared up recently. Likely contributing to his above throat clearing. Will try 2 week course of protonix  and he will follow up in a couple of weeks.     Subjective:  HPI:  See assessment / plan for status of chronic conditions.   Discussed the use of AI scribe software for clinical note transcription with the patient, who gave verbal consent to proceed.  History of Present Illness Carlos David is a 78 year old male who presents with a sore throat and hoarseness.  He has been experiencing a sore throat and hoarseness intermittently for a couple of weeks. The symptoms worsen with talking and improve with water  intake. He suspects that the dryness and hoarseness may be related to recent medication changes and the use of CBD gummies. He recently switched from gabapentin  to pregabalin, which he notes is a smaller capsule and may cause dryness. He also started consuming CBD gummies from [facility], taking one after each meal. The gummies have improved his overall pain, particularly in his legs and back, where previous  treatments like epidurals were ineffective.  He has a history of thyroid  cancer and is concerned about the possibility of recurrence, although he has not experienced symptoms like runny nose or postnasal drip. No frequent heartburn or reflux, but he acknowledges occasional occurrences.  In July, he experienced a fall that led to a visit to the ER, where he was diagnosed with a compression fracture of the L3 vertebra. He was initially prescribed a brace and oxycodone  for pain management. Subsequent evaluation by his neurosurgeon revealed that the fracture was pre-existing, as shown in a June MRI. He is awaiting results from a recent bone density test.  His social history includes being a grandfather to two grandsons aged seven and eleven. He is turning [age] next year and is actively involved in family activities despite his medical conditions.         Objective:  Physical Exam: BP 134/86 (BP Location: Right Arm, Patient Position: Sitting, Cuff Size: Normal)   Pulse 72   Temp 98.8 F (37.1 C)   Ht 5' 10 (1.778 m)   Wt 205 lb 3.2 oz (93.1 kg)   SpO2 97%   BMI 29.44 kg/m   Gen: No acute distress, resting comfortably HEENT: Oropharynx slightly erythematous.  No exudates. CV: Regular rate and rhythm with no murmurs appreciated Pulm: Normal work of breathing, clear to auscultation bilaterally with no crackles, wheezes, or rhonchi Neuro: Grossly normal, moves all extremities Psych: Normal affect and thought content      Carlos David M. Kennyth, MD 07/12/2024 3:20  PM

## 2024-07-12 NOTE — Assessment & Plan Note (Signed)
 Not currently having any symptoms.

## 2024-07-22 DIAGNOSIS — M7672 Peroneal tendinitis, left leg: Secondary | ICD-10-CM | POA: Diagnosis not present

## 2024-07-25 ENCOUNTER — Ambulatory Visit (INDEPENDENT_AMBULATORY_CARE_PROVIDER_SITE_OTHER): Payer: Medicare HMO | Admitting: Family Medicine

## 2024-07-25 ENCOUNTER — Encounter: Payer: Self-pay | Admitting: Family Medicine

## 2024-07-25 VITALS — BP 143/84 | HR 72 | Temp 98.1°F | Ht 70.0 in | Wt 202.8 lb

## 2024-07-25 DIAGNOSIS — K219 Gastro-esophageal reflux disease without esophagitis: Secondary | ICD-10-CM

## 2024-07-25 DIAGNOSIS — E785 Hyperlipidemia, unspecified: Secondary | ICD-10-CM | POA: Diagnosis not present

## 2024-07-25 DIAGNOSIS — M48062 Spinal stenosis, lumbar region with neurogenic claudication: Secondary | ICD-10-CM | POA: Diagnosis not present

## 2024-07-25 DIAGNOSIS — R0989 Other specified symptoms and signs involving the circulatory and respiratory systems: Secondary | ICD-10-CM | POA: Diagnosis not present

## 2024-07-25 DIAGNOSIS — E039 Hypothyroidism, unspecified: Secondary | ICD-10-CM

## 2024-07-25 DIAGNOSIS — I10 Essential (primary) hypertension: Secondary | ICD-10-CM

## 2024-07-25 DIAGNOSIS — G8929 Other chronic pain: Secondary | ICD-10-CM

## 2024-07-25 DIAGNOSIS — Z131 Encounter for screening for diabetes mellitus: Secondary | ICD-10-CM | POA: Diagnosis not present

## 2024-07-25 DIAGNOSIS — Z0001 Encounter for general adult medical examination with abnormal findings: Secondary | ICD-10-CM

## 2024-07-25 DIAGNOSIS — M545 Low back pain, unspecified: Secondary | ICD-10-CM | POA: Diagnosis not present

## 2024-07-25 DIAGNOSIS — M150 Primary generalized (osteo)arthritis: Secondary | ICD-10-CM | POA: Diagnosis not present

## 2024-07-25 DIAGNOSIS — M47816 Spondylosis without myelopathy or radiculopathy, lumbar region: Secondary | ICD-10-CM | POA: Diagnosis not present

## 2024-07-25 LAB — LIPID PANEL
Cholesterol: 138 mg/dL (ref 0–200)
HDL: 75.3 mg/dL (ref >39.00)
LDL Cholesterol: 52 mg/dL (ref 0–99)
NonHDL: 62.42
Total CHOL/HDL Ratio: 2
Triglycerides: 53 mg/dL (ref 0.0–149.0)
VLDL: 10.6 mg/dL (ref 0.0–40.0)

## 2024-07-25 LAB — CBC
HCT: 42.1 % (ref 39.0–52.0)
Hemoglobin: 14.1 g/dL (ref 13.0–17.0)
MCHC: 33.5 g/dL (ref 30.0–36.0)
MCV: 89.3 fl (ref 78.0–100.0)
Platelets: 154 K/uL (ref 150.0–400.0)
RBC: 4.72 Mil/uL (ref 4.22–5.81)
RDW: 14.1 % (ref 11.5–15.5)
WBC: 6.7 K/uL (ref 4.0–10.5)

## 2024-07-25 LAB — COMPREHENSIVE METABOLIC PANEL WITH GFR
ALT: 28 U/L (ref 0–53)
AST: 27 U/L (ref 0–37)
Albumin: 4.2 g/dL (ref 3.5–5.2)
Alkaline Phosphatase: 62 U/L (ref 39–117)
BUN: 24 mg/dL — ABNORMAL HIGH (ref 6–23)
CO2: 27 meq/L (ref 19–32)
Calcium: 9.3 mg/dL (ref 8.4–10.5)
Chloride: 103 meq/L (ref 96–112)
Creatinine, Ser: 1.23 mg/dL (ref 0.40–1.50)
GFR: 55.86 mL/min — ABNORMAL LOW (ref >60.00)
Glucose, Bld: 87 mg/dL (ref 70–99)
Potassium: 4.2 meq/L (ref 3.5–5.1)
Sodium: 138 meq/L (ref 135–145)
Total Bilirubin: 0.8 mg/dL (ref 0.2–1.2)
Total Protein: 6.9 g/dL (ref 6.0–8.3)

## 2024-07-25 LAB — HEMOGLOBIN A1C: Hgb A1c MFr Bld: 6.4 % (ref 4.6–6.5)

## 2024-07-25 LAB — TSH: TSH: 6.18 u[IU]/mL — ABNORMAL HIGH (ref 0.35–5.50)

## 2024-07-25 NOTE — Patient Instructions (Signed)
 It was very nice to see you today!  VISIT SUMMARY: Today, you had a routine physical exam and follow-up on your orthopedic issues. We discussed your dry mouth, back pain, knee pain, hand arthritis, weight management, high blood pressure, and hearing loss. Blood work will be done to assess your current health status.  YOUR PLAN: ESSENTIAL HYPERTENSION: Your blood pressure was elevated today, possibly due to white coat syndrome, though it has been well-controlled at home. -Continue to monitor your blood pressure at home and recheck it before leaving the office.  PRIMARY GENERALIZED OSTEOARTHRITIS INVOLVING SPINE, KNEES, AND HANDS: You have chronic osteoarthritis causing pain and stiffness in your spine, knees, and hands. -Continue with your current management strategies. A referral to orthopedics will be considered if symptoms worsen.  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 -Continue seeing your neurosurgeon for further evaluation and management. Surgical intervention will be considered if recommended.  RIGHT KNEE PAIN WITH OSTEOARTHRITIS AND EFFUSION: You have chronic right knee pain with osteoarthritis and effusion, with recent injections providing limited relief. -Continue with your current management strategies. Further evaluation will be considered if symptoms persist.  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 -Continue with your current management strategies. Further evaluation will be considered if symptoms worsen.  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 -Monitor your  symptoms and report any worsening. A referral to ENT will be considered if symptoms persist or worsen.  HYPERLIPIDEMIA: You are following a low-fat, low-salt diet to manage your cholesterol levels. -Continue with your current diet.  GENERAL HEALTH MAINTENANCE: You are overweight and aim to lose weight. Blood work will be performed to assess your current health status. -Exercise regularly and aim to lose weight. Continue a healthy diet with reduced salt and fried foods. -Blood work will be done to assess your current health status.  Return in about 1 year (around 07/25/2025) for Annual Physical.   Take care, Dr Kennyth  PLEASE NOTE:  If you had any lab tests, please let us  know if you have not heard back within a few days. You may see your results on mychart before we have a chance to review them but we will give you a call once they are reviewed by us .   If we ordered any referrals today, please let us  know if you have not heard from their office within the next week.   If you had any urgent prescriptions sent in today, please check with the pharmacy within an hour of our visit to make sure the prescription was transmitted appropriately.   Please try these tips to maintain a healthy lifestyle:  Eat at least 3 REAL meals and 1-2 snacks per day.  Aim for no more than 5 hours between eating.  If you eat breakfast, please do so within one hour of getting up.   Each meal should contain half fruits/vegetables, one quarter protein, and one quarter carbs (no bigger than a computer mouse)  Cut down on sweet beverages. This includes juice, soda, and sweet tea.   Drink at least 1 glass of water  with each meal and aim for at least 8 glasses per day  Exercise at least 150 minutes  every week.    Preventive Care 23 Years and Older, Male Preventive care refers to lifestyle choices and visits with your health care provider that can promote health and wellness. Preventive care visits are also called  wellness exams. What can I expect for my preventive care visit? Counseling During your preventive care visit, your health care provider may ask about your: Medical history, including: Past medical problems. Family medical history. History of falls. Current health, including: Emotional well-being. Home life and relationship well-being. Sexual activity. Memory and ability to understand (cognition). Lifestyle, including: Alcohol, nicotine or tobacco, and drug use. Access to firearms. Diet, exercise, and sleep habits. Work and work Astronomer. Sunscreen use. Safety issues such as seatbelt and bike helmet use. Physical exam Your health care provider will check your: Height and weight. These may be used to calculate your BMI (body mass index). BMI is a measurement that tells if you are at a healthy weight. Waist circumference. This measures the distance around your waistline. This measurement also tells if you are at a healthy weight and may help predict your risk of certain diseases, such as type 2 diabetes and high blood pressure. Heart rate and blood pressure. Body temperature. Skin for abnormal spots. What immunizations do I need?  Vaccines are usually given at various ages, according to a schedule. Your health care provider will recommend vaccines for you based on your age, medical history, and lifestyle or other factors, such as travel or where you work. What tests do I need? Screening Your health care provider may recommend screening tests for certain conditions. This may include: Lipid and cholesterol levels. Diabetes screening. This is done by checking your blood sugar (glucose) after you have not eaten for a while (fasting). Hepatitis C test. Hepatitis B test. HIV (human immunodeficiency virus) test. STI (sexually transmitted infection) testing, if you are at risk. Lung cancer screening. Colorectal cancer screening. Prostate cancer screening. Abdominal aortic aneurysm  (AAA) screening. You may need this if you are a current or former smoker. Talk with your health care provider about your test results, treatment options, and if necessary, the need for more tests. Follow these instructions at home: Eating and drinking  Eat a diet that includes fresh fruits and vegetables, whole grains, lean protein, and low-fat dairy products. Limit your intake of foods with high amounts of sugar, saturated fats, and salt. Take vitamin and mineral supplements as recommended by your health care provider. Do not drink alcohol if your health care provider tells you not to drink. If you drink alcohol: Limit how much you have to 0-2 drinks a day. Know how much alcohol is in your drink. In the [place] one drink equals one 12 oz bottle of beer (355 mL), one 5 oz glass of wine (148 mL), or one 1 oz glass of hard liquor (44 mL). Lifestyle Brush your teeth every morning and night with fluoride toothpaste. Floss one time each day. Exercise for at least 30 minutes 5 or more days each week. Do not use any products that contain nicotine or tobacco. These products include cigarettes, chewing tobacco, and vaping devices, such as e-cigarettes. If you need help quitting, ask your health care provider. Do not use drugs. If you are sexually active, practice safe sex. Use a condom or other form of protection to prevent STIs. Take aspirin  only as told by your health care provider. Make sure that you understand how much to take and what form to take. Work with your health care provider to  find out whether it is safe and beneficial for you to take aspirin  daily. Ask your health care provider if you need to take a cholesterol-lowering medicine (statin). Find healthy ways to manage stress, such as: Meditation, yoga, or listening to music. Journaling. Talking to a trusted person. Spending time with friends and family. Safety Always wear your seat belt while driving or riding in a vehicle. Do not  drive: If you have been drinking alcohol. Do not ride with someone who has been drinking. When you are tired or distracted. While texting. If you have been using any mind-altering substances or drugs. Wear a helmet and other protective equipment during sports activities. If you have firearms in your house, make sure you follow all gun safety procedures. Minimize exposure to UV radiation to reduce your risk of skin cancer. What's next? Visit your health care provider once a year for an annual wellness visit. Ask your health care provider how often you should have your eyes and teeth checked. Stay up to date on all vaccines. This information is not intended to replace advice given to you by your health care provider. Make sure you discuss any questions you have with your health care provider. Document Revised: 05/01/2021 Document Reviewed: 05/01/2021 Elsevier Patient Education  2024 [employer].

## 2024-07-25 NOTE — Progress Notes (Signed)
 Chief Complaint:  Carlos David is a 78 y.o. male who presents today for his annual comprehensive physical exam.    Assessment/Plan:  New/Acute Problems: Throat Clearing  Symptoms have improved significantly since our last visit. He will let me know if he has any recurrence and we can refer to ENT if needed.   Chronic Problems Addressed Today: Dyslipidemia Check labs.  Discussed lifestyle modifications.  He is on Lipitor 40 mg daily per cardiology.  Osteoarthritis Follows with orthopedics.  Still having quite a bit of pain in his back and knees.  He has some meloxicam  and Voltaren .  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  GERD (gastroesophageal reflux disease) Now on Protonix  40 mg daily doing well with this.  Hypothyroidism s/p thyroidectomy 1990 due to thyroid  cancer Check TSH.  He is on Synthroid  137 mcg daily.  Essential hypertension Slightly elevated today.  He was well-controlled at his visit here couple of weeks ago home losartan  100 mg daily.  He will continue his current regimen and continue to monitor at home and let us  know if persistently elevated.  Preventative Healthcare: Check labs.  Up-to-date on vaccines.  No longer needs colonoscopy due to age.  Patient Counseling(The following topics were reviewed and/or handout was given):  -Nutrition: Stressed importance of moderation in sodium/caffeine intake, saturated fat and cholesterol, caloric balance, sufficient intake of fresh fruits, vegetables, and fiber.  -Stressed the importance of regular exercise.   -Substance Abuse: Discussed cessation/primary prevention of tobacco, alcohol, or other drug use; driving or other dangerous activities under the influence; availability of treatment for abuse.   -Injury prevention: Discussed safety belts, safety helmets, smoke detector, smoking near bedding or upholstery.   -Sexuality: Discussed sexually transmitted  diseases, partner selection, use of condoms, avoidance of unintended pregnancy and contraceptive alternatives.   -Dental health: Discussed importance of regular tooth brushing, flossing, and dental visits.  -Health maintenance and immunizations reviewed. Please refer to Health maintenance section.  Return to care in 1 year for next preventative visit.     Subjective:  HPI:  He has no acute complaints today. Patient is here today for his  annual physical.  See assessment / plan for status of chronic conditions.  Discussed the use of AI scribe software for clinical note transcription with the patient, who gave verbal consent to proceed.  History of Present Illness Carlos David is a 78 year old male who presents for a routine physical exam and follow-up on his orthopedic issues.  I last saw him a few weeks ago for throat clearing.  We started him on Protonix  due to concern this may be due to his GERD.  Symptoms have improved.  He also attributes his symptoms due to CBD Gummies which may have caused dry mouth.  He is following up with his surgeon regarding a previous fall that resulted in a cracked L3 vertebra. His last epidural was effective, but he now experiences numbness and tingling in his left leg, occasionally causing his leg to collapse. He has a history of epidural injections and pain management for back issues, which began 7-8 years ago. He reports that previous doctors have told him that there is narrowing at L4 and L5.  He experiences knee pain, particularly in the right knee, which sometimes burns. Recent knee injections provided minimal relief. He is uncertain if the knee pain is related to his back issues. He has a history of arthritis in his knees.  He has arthritis in his hands, which sometimes lock up, particularly his thumb. He has received injections in the past but was informed that the joints are worn out.  His weight is around 202 pounds, and he aims to reduce it to 190  pounds. He does not exercise regularly and has been 'slack' about going to the gym. He follows a diet with no added salt and minimal fried foods but acknowledges not getting enough exercise. He attributes some weight gain to gabapentin .  He has a history of high blood pressure, which was elevated during today's visit. He monitors his blood pressure at home, where it usually runs around 130-140/80. He takes multiple medications, including gabapentin , avastatin, and blood pressure medication, and uses a pill organizer to manage them.  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 He finds it difficult to hear in large groups or when there is background noise.       07/25/2024    8:00 AM  Depression screen PHQ 2/9  Decreased Interest 0  Down, Depressed, Hopeless 0  PHQ - 2 Score 0    There are no preventive care reminders to display for this patient.   ROS: Per HPI, otherwise a complete review of systems was negative.   PMH:  The following were reviewed and entered/updated in epic: Past Medical History:  Diagnosis Date   Actinic keratosis 12/16/2010   Qualifier: Diagnosis of  By: Jame  MD, Maude FALCON    Arthritis    BPH (benign prostatic hyperplasia)    Cancer (HCC)    thyroid  cancer 1990   Cataract    both eyes   History of radiation therapy 1990   HYPERLIPIDEMIA 12/26/2008   Hypertension    HYPOTHYROIDISM 12/26/2008   PPD positive    Thoracic aortic aneurysm (HCC)    THYROID  CANCER, HX OF 1990   THYROID  CANCER, HX OF 12/26/2008   Qualifier: Diagnosis of  By: Jame  MD, Maude FALCON    Patient Active Problem List   Diagnosis Date Noted   GERD (gastroesophageal reflux disease) 07/12/2024   Allergic rhinitis 08/08/2022   Carpal tunnel syndrome 03/06/2020   Essential hypertension 12/17/2018   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 12/17/2018   Erectile dysfunction 12/17/2018    Lumbar radiculopathy 10/14/2016   BPH s/p TURP 2020 03/02/2014   Osteoarthritis 07/16/2012   Hypothyroidism s/p thyroidectomy 1990 due to thyroid  cancer 12/26/2008   Dyslipidemia 12/26/2008   Past Surgical History:  Procedure Laterality Date   CATARACT EXTRACTION, BILATERAL  2020   COLONOSCOPY     10-ye ago-normal at [facility]   EYE SURGERY     as child   HAND SURGERY     right hand trigger figger    INGUINAL HERNIA REPAIR Right 02/25/2022   Procedure: OPEN RIGHT INGUINAL HERNIA REPAIR WITH MESH;  Surgeon: Kinsinger, Herlene Righter, MD;  Location: [facility];  Service: General;  Laterality: Right;   neckotomy  1990   20 lymph nodes removed from left neck   TOTAL THYROIDECTOMY  1990   TRANSURETHRAL RESECTION OF PROSTATE N/A 01/21/2019   Procedure: TRANSURETHRAL RESECTION OF THE PROSTATE (TURP);  Surgeon: Alvaro Hummer, MD;  Location: [facility];  Service: Urology;  Laterality: N/A;  15 MINS    Family History  Problem Relation Age of Onset   Colon cancer Neg Hx    Rectal cancer Neg Hx  Stomach cancer Neg Hx    Esophageal cancer Neg Hx     Medications- reviewed and updated Current Outpatient Medications  Medication Sig Dispense Refill   aspirin  EC 81 MG tablet Take 1 tablet (81 mg total) by mouth daily. Swallow whole. 90 tablet 3   atorvastatin  (LIPITOR) 40 MG tablet TAKE 1 TABLET BY MOUTH EVERY DAY 90 tablet 2   cyclobenzaprine (FLEXERIL) 10 MG tablet Take 10 mg by mouth 3 (three) times daily as needed for muscle spasms.     ezetimibe  (ZETIA ) 10 MG tablet TAKE 1 TABLET BY MOUTH EVERY DAY 90 tablet 1   finasteride  (PROSCAR ) 5 MG tablet TAKE 1 TABLET BY MOUTH EVERY DAY 90 tablet 1   hydrocortisone 2.5 % cream      levothyroxine  (SYNTHROID ) 137 MCG tablet TAKE 1 TABLET BY MOUTH EVERY DAY 90 tablet 1   losartan  (COZAAR ) 100 MG tablet TAKE 1 TABLET BY MOUTH EVERY DAY 90 tablet 2   meloxicam  (MOBIC ) 15 MG tablet Take 1 tablet (15 mg total) by mouth as  needed. 30 tablet 0   OVER THE COUNTER MEDICATION daily. Nature's Pearl     OVER THE COUNTER MEDICATION 3 (three) times daily. CBD Gummy     pantoprazole  (PROTONIX ) 40 MG tablet Take 1 tablet (40 mg total) by mouth daily. 30 tablet 0   pregabalin (LYRICA) 100 MG capsule Take 100 mg by mouth 2 (two) times daily.     TOLAK 4 % CREA Apply 1 Application topically daily. (Patient not taking: Reported on 07/12/2024)     Current Facility-Administered Medications  Medication Dose Route Frequency Provider Last Rate Last Admin   0.9 %  sodium chloride  infusion  500 mL Intravenous Once Avram Lupita BRAVO, MD        Allergies-reviewed and updated Allergies  Allergen Reactions   Penicillins Hives    Social History   Socioeconomic History   Marital status: Married    Spouse name: Not on file   Number of children: Not on file   Years of education: Not on file   Highest education level: Not on file  Occupational History   Not on file  Tobacco Use   Smoking status: Former    Current packs/day: 0.00    Types: Cigarettes, Pipe    Quit date: 11/17/1968    Years since quitting: 55.7   Smokeless tobacco: Never  Vaping Use   Vaping status: Never Used  Substance and Sexual Activity   Alcohol use: Not Currently    Comment: occas. beer   Drug use: No   Sexual activity: Not on file  Other Topics Concern   Not on file  Social History Narrative   Not on file   Social Drivers of Health   Financial Resource Strain: Low Risk  (07/11/2024)   Overall Financial Resource Strain (CARDIA)    Difficulty of Paying Living Expenses: Not hard at all  Food Insecurity: Unknown (07/11/2024)   Hunger Vital Sign    Worried About Running Out of Food in the Last Year: Never true    Ran Out of Food in the Last Year: Not on file  Transportation Needs: Unknown (07/11/2024)   PRAPARE - Administrator, Civil Service (Medical): Not on file    Lack of Transportation (Non-Medical): No  Physical Activity: Unknown  (07/11/2024)   Exercise Vital Sign    Days of Exercise per Week: 1 day    Minutes of Exercise per Session: Patient declined  Recent Concern:  Physical Activity - Inactive (06/29/2024)   Exercise Vital Sign    Days of Exercise per Week: 0 days    Minutes of Exercise per Session: 0 min  Stress: No Stress Concern Present (07/11/2024)   [employer] of Occupational Health - Occupational Stress Questionnaire    Feeling of Stress: Not at all  Social Connections: Unknown (07/11/2024)   Social Connection and Isolation Panel    Frequency of Communication with Friends and Family: Patient declined    Frequency of Social Gatherings with Friends and Family: Not on file    Attends Religious Services: More than 4 times per year    Active Member of [employer] or Organizations: Yes    Attends Engineer, structural: Patient declined    Marital Status: Married        Objective:  Physical Exam: BP (!) 143/84   Pulse 72   Temp 98.1 F (36.7 C) (Temporal)   Ht 5' 10 (1.778 m)   Wt 202 lb 12.8 oz (92 kg)   SpO2 97%   BMI 29.10 kg/m   Body mass index is 29.1 kg/m. Wt Readings from Last 3 Encounters:  07/25/24 202 lb 12.8 oz (92 kg)  07/12/24 205 lb 3.2 oz (93.1 kg)  06/29/24 200 lb (90.7 kg)   Gen: NAD, resting comfortably HEENT: TMs normal bilaterally. OP clear. No thyromegaly noted.  CV: RRR with no murmurs appreciated Pulm: NWOB, CTAB with no crackles, wheezes, or rhonchi GI: Normal bowel sounds present. Soft, Nontender, Nondistended. MSK: no edema, cyanosis, or clubbing noted Skin: warm, dry Neuro: CN2-12 grossly intact. Strength 5/5 in upper and lower extremities. Reflexes symmetric and intact bilaterally.  Psych: Normal affect and thought content     Makailee Nudelman M. Kennyth, MD 07/25/2024 8:44 AM

## 2024-07-25 NOTE — Assessment & Plan Note (Signed)
 Check labs.  Discussed lifestyle modifications.  He is on Lipitor 40 mg daily per cardiology.

## 2024-07-25 NOTE — Assessment & Plan Note (Signed)
 Follows with orthopedics.  Still having quite a bit of pain in his back and knees.  He has some meloxicam  and Voltaren .

## 2024-07-25 NOTE — Assessment & Plan Note (Signed)
 Slightly elevated today.  He was well-controlled at his visit here couple of weeks ago home losartan  100 mg daily.  He will continue his current regimen and continue to monitor at home and let us  know if persistently elevated.

## 2024-07-25 NOTE — Assessment & Plan Note (Signed)
Check TSH.  He is on Synthroid 137 mcg daily.

## 2024-07-25 NOTE — Assessment & Plan Note (Signed)
 Now on Protonix  40 mg daily doing well with this.

## 2024-07-25 NOTE — Assessment & Plan Note (Signed)
Follows with neurosurgery

## 2024-07-27 ENCOUNTER — Ambulatory Visit: Payer: Self-pay | Admitting: Family Medicine

## 2024-07-27 DIAGNOSIS — E039 Hypothyroidism, unspecified: Secondary | ICD-10-CM

## 2024-07-27 DIAGNOSIS — E860 Dehydration: Secondary | ICD-10-CM

## 2024-07-27 NOTE — Progress Notes (Signed)
 His kidney numbers are up a little bit.  Looks like he is probably dehydrated.  Recommend he push fluids and come back here this week or next week to recheck his VMAT.  Please place future order.  Thyroid  level is also off a little bit.  Recommend he come back to recheck this as well.  Please place future order for TSH, free T4, and free T3.  A1c is up a little bit however still in the prediabetic range.  Do not need to start meds for this however he should continue to work on diet and exercise and we can recheck this in year.  The rest of his labs are all at goal and we can recheck in a year.

## 2024-07-29 ENCOUNTER — Telehealth: Payer: Self-pay | Admitting: *Deleted

## 2024-07-29 NOTE — Telephone Encounter (Signed)
 Copied from CRM [id number]. Topic: Clinical - Request for Lab/Test Order >> Jul 29, 2024  8:40 AM Mercedes MATSU wrote: Reason for CRM: Patient was supposed to get his thyroid  retested per dr. Kennyth, patient is requesting lab order. He can be reached at [phone] once order has been placed for scheduling.   Future lab order  Please schedule lab appt [facility]

## 2024-08-01 NOTE — Telephone Encounter (Signed)
 See note

## 2024-08-02 NOTE — Telephone Encounter (Signed)
 None of those should have impacted his thyroid  levels.

## 2024-08-03 ENCOUNTER — Other Ambulatory Visit (INDEPENDENT_AMBULATORY_CARE_PROVIDER_SITE_OTHER)

## 2024-08-03 DIAGNOSIS — E039 Hypothyroidism, unspecified: Secondary | ICD-10-CM | POA: Diagnosis not present

## 2024-08-03 LAB — TSH: TSH: 5.31 u[IU]/mL (ref 0.35–5.50)

## 2024-08-03 LAB — T3, FREE: T3, Free: 2.5 pg/mL (ref 2.3–4.2)

## 2024-08-03 LAB — T4, FREE: Free T4: 1.12 ng/dL (ref 0.60–1.60)

## 2024-08-04 ENCOUNTER — Other Ambulatory Visit: Payer: Self-pay | Admitting: Family Medicine

## 2024-08-04 ENCOUNTER — Ambulatory Visit: Payer: Self-pay | Admitting: Family Medicine

## 2024-08-04 NOTE — Progress Notes (Signed)
 Thyroid  levels are back to normal.  We do not need to do any further testing at this point.  We can recheck again in a year however he should let us  know if he develops any signs or symptoms of low thyroid  such as fatigue, hair loss, constipation, dry skin, etc.

## 2024-08-05 ENCOUNTER — Ambulatory Visit: Payer: Self-pay | Admitting: *Deleted

## 2024-08-05 NOTE — Telephone Encounter (Signed)
 FYI Only or Action Required?: FYI only for provider.  Patient was last seen in primary care on 07/25/2024 by Kennyth Worth HERO, MD.  Called Nurse Triage reporting Laceration (Scrape on ankle).  Symptoms began several weeks ago.  Interventions attempted: OTC medications: antibiotic ointment.  Symptoms are: gradually worsening.  Triage Disposition: See PCP When Office is Open (Within 3 Days)  Patient/caregiver understands and will follow disposition?: Yes   Reason for Disposition  [1] After 14 days AND [2] wound isn't healed  Answer Assessment - Initial Assessment Questions 1. APPEARANCE of INJURY: What does the injury look like?      Hole in center of scrape, red and inflamed 2. SIZE: How large is the scrape?      1x1 3. BLEEDING: Is it bleeding now? If Yes, ask: Is it difficult to stop?      no 4. LOCATION: Where is the injury located?      R ankle 5. ONSET: How long ago did the injury occur?      2 weeks 6. MECHANISM: Tell me how it happened.      Scarped on brick 7. TETANUS: When was your last tetanus booster?     Yes- 2025  Protocols used: Scrapes-A-AH  opied from CRM [id number]. Topic: Clinical - Red Word Triage >> Aug 05, 2024 11:07 AM [place] A wrote: [facility] that prompted transfer to Nurse Triage: Patient has scrap  on his right ankle, looks inflamed and red. Some pain

## 2024-08-17 DIAGNOSIS — L821 Other seborrheic keratosis: Secondary | ICD-10-CM | POA: Diagnosis not present

## 2024-08-17 DIAGNOSIS — Z86006 Personal history of melanoma in-situ: Secondary | ICD-10-CM | POA: Diagnosis not present

## 2024-08-17 DIAGNOSIS — L814 Other melanin hyperpigmentation: Secondary | ICD-10-CM | POA: Diagnosis not present

## 2024-08-17 DIAGNOSIS — L578 Other skin changes due to chronic exposure to nonionizing radiation: Secondary | ICD-10-CM | POA: Diagnosis not present

## 2024-08-17 DIAGNOSIS — D1801 Hemangioma of skin and subcutaneous tissue: Secondary | ICD-10-CM | POA: Diagnosis not present

## 2024-08-17 DIAGNOSIS — L820 Inflamed seborrheic keratosis: Secondary | ICD-10-CM | POA: Diagnosis not present

## 2024-08-17 DIAGNOSIS — L538 Other specified erythematous conditions: Secondary | ICD-10-CM | POA: Diagnosis not present

## 2024-08-17 DIAGNOSIS — Z08 Encounter for follow-up examination after completed treatment for malignant neoplasm: Secondary | ICD-10-CM | POA: Diagnosis not present

## 2024-08-31 DIAGNOSIS — L923 Foreign body granuloma of the skin and subcutaneous tissue: Secondary | ICD-10-CM | POA: Diagnosis not present

## 2024-09-19 ENCOUNTER — Encounter: Payer: Self-pay | Admitting: Radiology

## 2024-09-23 ENCOUNTER — Encounter: Payer: Self-pay | Admitting: Orthopedic Surgery

## 2024-10-03 ENCOUNTER — Ambulatory Visit: Admitting: Orthopedic Surgery

## 2024-10-03 ENCOUNTER — Encounter: Payer: Self-pay | Admitting: Orthopedic Surgery

## 2024-10-03 ENCOUNTER — Other Ambulatory Visit: Payer: Self-pay

## 2024-10-03 DIAGNOSIS — M170 Bilateral primary osteoarthritis of knee: Secondary | ICD-10-CM

## 2024-10-03 DIAGNOSIS — M1711 Unilateral primary osteoarthritis, right knee: Secondary | ICD-10-CM

## 2024-10-03 MED ORDER — BUPIVACAINE HCL 0.25 % IJ SOLN
4.0000 mL | INTRAMUSCULAR | Status: AC | PRN
  Administered 2024-10-03: 4 mL via INTRA_ARTICULAR

## 2024-10-03 MED ORDER — LIDOCAINE HCL 1 % IJ SOLN
5.0000 mL | INTRAMUSCULAR | Status: AC | PRN
  Administered 2024-10-03: 5 mL

## 2024-10-03 MED ORDER — TRIAMCINOLONE ACETONIDE 40 MG/ML IJ SUSP
40.0000 mg | INTRAMUSCULAR | Status: AC | PRN
  Administered 2024-10-03: 40 mg via INTRA_ARTICULAR

## 2024-10-03 NOTE — Progress Notes (Signed)
 Office Visit Note   Patient: Carlos David           Date of Birth: 04-27-1946           MRN: [id number] Visit Date: 10/03/2024 Requested by: Kennyth Worth HERO, MD [address] [place],  [place] [address] PCP: Kennyth Worth HERO, MD  Subjective: Chief Complaint  Patient presents with   Right Knee - Pain   Left Knee - Pain    HPI: Carlos David is a 78 y.o. male who presents to the office reporting right knee pain.  Patient describes a lot of patellofemoral crepitus on the right-hand side.  No history of injury or trauma to the knee since he was last seen.  Radiographs from August do show primarily patellofemoral arthritis.  Denies any instability in the right knee but he does report some occasional weakness.  Uses a cane.  Has been recommended to have multilevel lumbar spine fusion for stenosis.  He is considering that.  Injections in the past have helped him..                ROS: All systems reviewed are negative as they relate to the chief complaint within the history of present illness.  Patient denies fevers or chills.  Assessment & Plan: Visit Diagnoses: No diagnosis found.  Plan: Impression is right knee pain with effusion.  Has degenerative lateral meniscal tear and medial meniscal tear by MRI scanning along with significant patellofemoral arthritis.  An MRI scan was done in January 2025.  Will try an aspiration and injection of the right knee today.  See if that helps him.  Follow-up with us  as needed.  Follow-Up Instructions: No follow-ups on file.   Orders:  No orders of the defined types were placed in this encounter.  No orders of the defined types were placed in this encounter.     Procedures: Large Joint Inj: R knee on 10/03/2024 2:23 PM Indications: diagnostic evaluation, joint swelling and pain Details: 18 G 1.5 in needle, superolateral approach  Arthrogram: No  Medications: 5 mL lidocaine  1 %; 4 mL bupivacaine  0.25 %; 40 mg triamcinolone  acetonide 40  MG/ML Outcome: tolerated well, no immediate complications Procedure, treatment alternatives, risks and benefits explained, specific risks discussed. Consent was given by the patient. Immediately prior to procedure a time out was called to verify the correct patient, procedure, equipment, support staff and site/side marked as required. Patient was prepped and draped in the usual sterile fashion.       Clinical Data: No additional findings.  Objective: Vital Signs: There were no vitals taken for this visit.  Physical Exam:  Constitutional: Patient appears well-developed HEENT:  Head: Normocephalic Eyes:EOM are normal Neck: Normal range of motion Cardiovascular: Normal rate Pulmonary/chest: Effort normal Neurologic: Patient is alert Skin: Skin is warm Psychiatric: Patient has normal mood and affect  Ortho Exam: Ortho exam demonstrates mild effusion right no effusion left.  Still has good range of motion in both knees from 0 to about 125 of flexion.  Collateral cruciate ligaments are stable.  Has 5 out of 5 hip flexion strength as well as knee extension strength bilaterally.  No definite paresthesias L1-S1 bilaterally.  Pedal pulses palpable.  Specialty Comments:  No specialty comments available.  Imaging: No results found.   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 12/26/2008   Dyslipidemia 12/26/2008   Past Medical History:  Diagnosis Date   Actinic keratosis 12/16/2010   Qualifier: Diagnosis of  By: Jame  MD, Maude FALCON    Arthritis    BPH (benign prostatic hyperplasia)    Cancer (HCC)    thyroid  cancer 1990   Cataract    both eyes   History of radiation therapy 1990   HYPERLIPIDEMIA 12/26/2008   Hypertension    HYPOTHYROIDISM 12/26/2008   PPD positive    Thoracic aortic aneurysm    THYROID  CANCER, HX OF 1990   THYROID  CANCER, HX OF 12/26/2008   Qualifier: Diagnosis of  By: Jame  MD, Maude FALCON     Family History  Problem Relation Age of Onset   Colon cancer Neg Hx    Rectal cancer Neg Hx    Stomach cancer Neg Hx    Esophageal cancer Neg Hx     Past Surgical History:  Procedure Laterality Date   CATARACT EXTRACTION, BILATERAL  2020   COLONOSCOPY     10-ye ago-normal at [facility]   EYE SURGERY     as child   HAND SURGERY     right hand trigger figger    INGUINAL HERNIA REPAIR Right 02/25/2022   Procedure: OPEN RIGHT INGUINAL HERNIA REPAIR WITH MESH;  Surgeon: Kinsinger, Herlene Righter, MD;  Location: [facility];  Service: General;  Laterality: Right;   neckotomy  1990   20 lymph nodes removed from left neck   TOTAL THYROIDECTOMY  1990   TRANSURETHRAL RESECTION OF PROSTATE N/A 01/21/2019   Procedure: TRANSURETHRAL RESECTION OF THE PROSTATE (TURP);  Surgeon: Alvaro Hummer, MD;  Location: [facility];  Service: Urology;  Laterality: N/A;  67 MINS   Social History   Occupational History   Not on file  Tobacco Use   Smoking status: Former    Current packs/day: 0.00    Types: Cigarettes, Pipe    Quit date: 11/17/1968    Years since quitting: 55.9   Smokeless tobacco: Never  Vaping Use   Vaping status: Never Used  Substance and Sexual Activity   Alcohol use: Not Currently    Comment: occas. beer   Drug use: No   Sexual activity: Not on file

## 2024-10-03 NOTE — Addendum Note (Signed)
 Addended by: TRINDA DEANE HERO on: 10/03/2024 02:46 PM   Modules accepted: Orders

## 2024-10-05 DIAGNOSIS — M48062 Spinal stenosis, lumbar region with neurogenic claudication: Secondary | ICD-10-CM | POA: Diagnosis not present

## 2024-10-07 DIAGNOSIS — H524 Presbyopia: Secondary | ICD-10-CM | POA: Diagnosis not present

## 2024-10-07 DIAGNOSIS — H26493 Other secondary cataract, bilateral: Secondary | ICD-10-CM | POA: Diagnosis not present

## 2024-10-07 DIAGNOSIS — D3131 Benign neoplasm of right choroid: Secondary | ICD-10-CM | POA: Diagnosis not present

## 2024-10-19 DIAGNOSIS — M48062 Spinal stenosis, lumbar region with neurogenic claudication: Secondary | ICD-10-CM | POA: Diagnosis not present

## 2024-11-29 ENCOUNTER — Other Ambulatory Visit: Payer: Self-pay | Admitting: Family Medicine

## 2024-11-29 ENCOUNTER — Other Ambulatory Visit: Payer: Self-pay | Admitting: Cardiology

## 2024-12-06 ENCOUNTER — Other Ambulatory Visit: Payer: Self-pay | Admitting: Cardiology

## 2024-12-07 ENCOUNTER — Telehealth: Payer: Self-pay

## 2024-12-07 DIAGNOSIS — R059 Cough, unspecified: Secondary | ICD-10-CM

## 2024-12-07 NOTE — Telephone Encounter (Unsigned)
 Copied from CRM [id number]. Topic: Clinical - Medication Question >> Dec 07, 2024  4:23 PM Roselie BROCKS wrote: Reason for CRM: Patient states he has a dry cough, and afraid he may be getting a cold and wants to know if Dr. Kennyth can call in a medication for him. He requests  Tessalon  Perles. Please return call to patient concerning this .  Go to [employer]

## 2024-12-08 ENCOUNTER — Other Ambulatory Visit: Payer: Self-pay | Admitting: *Deleted

## 2024-12-08 ENCOUNTER — Ambulatory Visit: Admitting: Physician Assistant

## 2024-12-08 VITALS — BP 138/82 | HR 93 | Temp 98.2°F | Ht 70.0 in | Wt 202.8 lb

## 2024-12-08 DIAGNOSIS — J101 Influenza due to other identified influenza virus with other respiratory manifestations: Secondary | ICD-10-CM

## 2024-12-08 DIAGNOSIS — E7800 Pure hypercholesterolemia, unspecified: Secondary | ICD-10-CM | POA: Insufficient documentation

## 2024-12-08 LAB — POCT INFLUENZA A/B
Influenza A, POC: POSITIVE — AB
Influenza B, POC: NEGATIVE

## 2024-12-08 LAB — POC COVID19 BINAXNOW: SARS Coronavirus 2 Ag: NEGATIVE

## 2024-12-08 MED ORDER — XOFLUZA (80 MG DOSE) 1 X 80 MG PO TBPK: 1.0000 | ORAL_TABLET | Freq: Every day | ORAL | 0 refills | Status: AC

## 2024-12-08 MED ORDER — BENZONATATE 200 MG PO CAPS: 200.0000 mg | ORAL_CAPSULE | Freq: Two times a day (BID) | ORAL | 0 refills | Status: AC | PRN

## 2024-12-08 MED ORDER — OSELTAMIVIR PHOSPHATE 75 MG PO CAPS: 75.0000 mg | ORAL_CAPSULE | Freq: Two times a day (BID) | ORAL | 0 refills | Status: AC

## 2024-12-08 NOTE — Telephone Encounter (Signed)
 Rx send to pharmacy today.

## 2024-12-08 NOTE — Telephone Encounter (Signed)
 Okay to send in Tessalon  Perles 200 mg twice daily as needed for cough. Dispense #20. Needs appointment if not improving or if has any worsening symptoms.

## 2024-12-08 NOTE — Patient Instructions (Signed)
 YOU TESTED POSITIVE FOR INFLUENZA A. - PLEASE HAVE THE PHARMACY FILL XOFLUZA  (ONE TABLET) IF IN STOCK AND AFFORDABLE,  -- IF THIS IS NOT THE CASE, HAVE THE PHARMACY FILL THE TAMIFLU  ( AND YOU WILL TAKE THIS TWICE DAILY FOR 5 DAYS)  - YOUR WIFE MAY WANT TO CALL HER PCP FOR PREVENTIVE FLU MEDICINE  REST, HYDRATE WELL, TYLENOL  IF NEEDED  ER IF SHORT OF BREATH, CHEST PAIN, OR SEVERE WORSE SYMPTOMS.

## 2024-12-08 NOTE — Progress Notes (Signed)
 "   Patient ID: Carlos David, male    DOB: November 25, 1945, 79 y.o.   MRN: [id number]   Assessment & Plan:  Influenza A -     POCT Influenza A/B -     POC COVID-19 BinaxNow -     Oseltamivir  Phosphate; Take 1 capsule (75 mg total) by mouth 2 (two) times daily. PLEASE FILL THIS RX IF XOFLUZA  NOT AVAILABLE OR TOO EXPENSIVE.  Dispense: 10 capsule; Refill: 0 -     Xofluza  (80 MG Dose); Take 1 tablet by mouth daily.  Dispense: 1 each; Refill: 0    Assessment & Plan Influenza A Acute Influenza A infection confirmed by point of care testing. Symptoms include rhinorrhea, dry cough, pharyngitis, and mild rib pain. No fever or significant myalgia. Oxygen saturation is normal. Blood pressure is well-controlled. No significant respiratory distress. Discussed antiviral treatment options to reduce symptom severity and duration and prevent transmission. He has not received the influenza vaccine this season. - Prescribed Xofluza  (one tablet) if available and affordable. - Prescribed Tamiflu  (oral suspension) as an alternative if Xofluza  is unavailable or too expensive. - Advised increased fluid intake and rest. - Instructed to seek medical attention if experiencing chest pain or difficulty breathing. - Discussed preventive flu medication options for his wife to reduce transmission risk.     F/up prn     Subjective:    Chief Complaint  Patient presents with   Cough    Cough and congestion x2 days; pt feels like he may have strained a muscle when he was coughing;    Nasal Congestion    Runny nose, post nasal drip, sore throat scratchy throat;     Cough   Discussed the use of AI scribe software for clinical note transcription with the patient, who gave verbal consent to proceed.  History of Present Illness Carlos David is a 79 year old male who presents with cough and congestion for the last few days.  He has been experiencing cough and congestion for the past few days, accompanied by  rhinorrhea, postnasal drip, sore throat, and a scratchy throat. He believes he might have strained a muscle while coughing, leading to some aching in his ribs on the backside. No significant myalgias are present.  He recently visited his granddaughter who had an ear infection, which he suspects might be the source of his symptoms. He is concerned about his wife potentially contracting the illness, especially since she is undergoing major dental work soon.  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 He has been proactive in seeking treatment after discovering a bump on his neck, which led to the diagnosis. He mentions that he has not had the flu in a long time and does not typically receive the flu shot, relying instead on a supplement made from muscadine seeds.  He is currently taking a supplement called Nature's Pearl, made by the [employer], consisting of ground-up muscadine seeds, which he has been using for years. He takes two capsules regularly.     Past Medical History:  Diagnosis Date   Actinic keratosis 12/16/2010   Qualifier: Diagnosis of  By: Jame  MD, Maude FALCON    Arthritis    BPH (benign prostatic hyperplasia)    Cancer (HCC)    thyroid  cancer 1990   Cataract    both eyes   History of radiation therapy 1990  HYPERLIPIDEMIA 12/26/2008   Hypertension    HYPOTHYROIDISM 12/26/2008   PPD positive    Thoracic aortic aneurysm    THYROID  CANCER, HX OF 1990   THYROID  CANCER, HX OF 12/26/2008   Qualifier: Diagnosis of  By: Jame  MD, Maude FALCON     Past Surgical History:  Procedure Laterality Date   CATARACT EXTRACTION, BILATERAL  2020   COLONOSCOPY     10-ye ago-normal at [facility]   EYE SURGERY     as child   HAND SURGERY     right hand trigger figger    INGUINAL HERNIA REPAIR Right 02/25/2022   Procedure: OPEN RIGHT INGUINAL HERNIA REPAIR WITH MESH;   Surgeon: Kinsinger, Herlene Righter, MD;  Location: [facility];  Service: General;  Laterality: Right;   neckotomy  1990   20 lymph nodes removed from left neck   TOTAL THYROIDECTOMY  1990   TRANSURETHRAL RESECTION OF PROSTATE N/A 01/21/2019   Procedure: TRANSURETHRAL RESECTION OF THE PROSTATE (TURP);  Surgeon: Alvaro Hummer, MD;  Location: [facility];  Service: Urology;  Laterality: N/A;  85 MINS    Family History  Problem Relation Age of Onset   Colon cancer Neg Hx    Rectal cancer Neg Hx    Stomach cancer Neg Hx    Esophageal cancer Neg Hx     Social History[1]   Allergies[2]  Review of Systems  Respiratory:  Positive for cough.    NEGATIVE UNLESS OTHERWISE INDICATED IN HPI      Objective:     BP 138/82 (BP Location: Left Arm, Patient Position: Sitting, Cuff Size: Normal)   Pulse 93   Temp 98.2 F (36.8 C) (Temporal)   Ht 5' 10 (1.778 m)   Wt 202 lb 12.8 oz (92 kg)   SpO2 95%   BMI 29.10 kg/m   Wt Readings from Last 3 Encounters:  12/08/24 202 lb 12.8 oz (92 kg)  07/25/24 202 lb 12.8 oz (92 kg)  07/12/24 205 lb 3.2 oz (93.1 kg)    BP Readings from Last 3 Encounters:  12/08/24 138/82  07/25/24 (!) 143/84  07/12/24 134/86     Physical Exam Vitals and nursing note reviewed.  Constitutional:      General: He is not in acute distress.    Appearance: Normal appearance. He is not ill-appearing.  HENT:     Head: Normocephalic.     Right Ear: Tympanic membrane, ear canal and external ear normal.     Left Ear: Tympanic membrane, ear canal and external ear normal.     Nose: Congestion present.     Mouth/Throat:     Mouth: Mucous membranes are moist.     Pharynx: No oropharyngeal exudate or posterior oropharyngeal erythema.  Eyes:     Extraocular Movements: Extraocular movements intact.     Conjunctiva/sclera: Conjunctivae normal.     Pupils: Pupils are equal, round, and reactive to light.  Cardiovascular:     Rate and  Rhythm: Normal rate and regular rhythm.     Pulses: Normal pulses.     Heart sounds: Normal heart sounds. No murmur heard. Pulmonary:     Effort: Pulmonary effort is normal. No respiratory distress.     Breath sounds: Normal breath sounds. No wheezing.  Musculoskeletal:     Cervical back: Normal range of motion.  Skin:    General: Skin is warm.  Neurological:     Mental Status: He is alert and oriented to person, place, and time.  Psychiatric:        Mood and Affect: Mood normal.        Behavior: Behavior normal.             Krystle Oberman M Addie Cederberg, PA-C    [1]  Social History Tobacco Use   Smoking status: Former    Current packs/day: 0.00    Types: Cigarettes, Pipe    Quit date: 11/17/1968    Years since quitting: 56.0   Smokeless tobacco: Never  Vaping Use   Vaping status: Never Used  Substance Use Topics   Alcohol use: Not Currently    Comment: occas. beer   Drug use: No  [2]  Allergies Allergen Reactions   Penicillins Hives   "

## 2024-12-13 NOTE — Telephone Encounter (Signed)
 Lipids completed 07/25/24

## 2024-12-22 ENCOUNTER — Other Ambulatory Visit: Payer: Self-pay | Admitting: Cardiology

## 2025-07-03 ENCOUNTER — Ambulatory Visit

## 2025-07-31 ENCOUNTER — Encounter: Admitting: Family Medicine
